# Patient Record
Sex: Female | Born: 1980 | Race: White | Hispanic: No | Marital: Single | Smoking: Never smoker
Health system: Southern US, Community
[De-identification: ages and names within clinical notes are randomized; demographics above are authoritative.]

## PROBLEM LIST (undated history)

## (undated) DIAGNOSIS — R002 Palpitations: Secondary | ICD-10-CM

## (undated) DIAGNOSIS — R7303 Prediabetes: Secondary | ICD-10-CM

## (undated) DIAGNOSIS — D649 Anemia, unspecified: Secondary | ICD-10-CM

## (undated) DIAGNOSIS — M791 Myalgia, unspecified site: Secondary | ICD-10-CM

## (undated) DIAGNOSIS — R7689 Other specified abnormal immunological findings in serum: Secondary | ICD-10-CM

## (undated) DIAGNOSIS — R0602 Shortness of breath: Secondary | ICD-10-CM

## (undated) DIAGNOSIS — M2550 Pain in unspecified joint: Secondary | ICD-10-CM

## (undated) DIAGNOSIS — D509 Iron deficiency anemia, unspecified: Secondary | ICD-10-CM

## (undated) DIAGNOSIS — E119 Type 2 diabetes mellitus without complications: Secondary | ICD-10-CM

## (undated) DIAGNOSIS — U071 COVID-19: Secondary | ICD-10-CM

## (undated) DIAGNOSIS — R768 Other specified abnormal immunological findings in serum: Secondary | ICD-10-CM

## (undated) HISTORY — DX: Type 2 diabetes mellitus without complications: E11.9

## (undated) HISTORY — DX: Pain in unspecified joint: M25.50

## (undated) HISTORY — DX: Palpitations: R00.2

## (undated) HISTORY — DX: Myalgia, unspecified site: M79.10

## (undated) HISTORY — DX: Other specified abnormal immunological findings in serum: R76.89

## (undated) HISTORY — DX: Anemia, unspecified: D64.9

## (undated) HISTORY — DX: Iron deficiency anemia, unspecified: D50.9

## (undated) HISTORY — PX: NO PAST SURGERIES: SHX2092

## (undated) HISTORY — DX: COVID-19: U07.1

## (undated) HISTORY — PX: OTHER SURGICAL HISTORY: SHX169

## (undated) HISTORY — DX: Other specified abnormal immunological findings in serum: R76.8

## (undated) HISTORY — DX: Shortness of breath: R06.02

---

## 2000-04-27 ENCOUNTER — Emergency Department (HOSPITAL_COMMUNITY): Admission: EM | Admit: 2000-04-27 | Discharge: 2000-04-27 | Payer: Self-pay | Admitting: Emergency Medicine

## 2000-04-27 ENCOUNTER — Encounter: Payer: Self-pay | Admitting: Emergency Medicine

## 2001-04-22 ENCOUNTER — Emergency Department (HOSPITAL_COMMUNITY): Admission: EM | Admit: 2001-04-22 | Discharge: 2001-04-22 | Payer: Self-pay | Admitting: Emergency Medicine

## 2007-03-20 ENCOUNTER — Emergency Department (HOSPITAL_COMMUNITY): Admission: EM | Admit: 2007-03-20 | Discharge: 2007-03-20 | Payer: Self-pay | Admitting: Emergency Medicine

## 2007-07-21 ENCOUNTER — Emergency Department (HOSPITAL_COMMUNITY): Admission: EM | Admit: 2007-07-21 | Discharge: 2007-07-21 | Payer: Self-pay | Admitting: Emergency Medicine

## 2007-07-23 ENCOUNTER — Ambulatory Visit (HOSPITAL_BASED_OUTPATIENT_CLINIC_OR_DEPARTMENT_OTHER): Admission: RE | Admit: 2007-07-23 | Discharge: 2007-07-23 | Payer: Self-pay | Admitting: Orthopedic Surgery

## 2011-04-29 NOTE — Op Note (Signed)
Kelli Cooper, Kelli Cooper           ACCOUNT NO.:  [id number]   MEDICAL RECORD NO.:  [id number]          PATIENT TYPE:  AMB   LOCATION:  DSC                          FACILITY:  [facility]   PHYSICIAN:  Feliberto Gottron. Turner Daniels, M.D.   DATE OF BIRTH:  [birth year]   DATE OF PROCEDURE:  07/23/2007  DATE OF DISCHARGE:                               OPERATIVE REPORT   PREOPERATIVE DIAGNOSIS:  Angulated and displaced left midshaft ulna  fracture.   POSTOPERATIVE DIAGNOSIS:  Angulated and displaced left midshaft ulna  fracture.   PROCEDURE:  Open reduction and internal fixation of left ulna fracture  using a six-hole DePuy 3.5 dynamic compression plate.   SURGEON:  Feliberto Gottron. Turner Daniels, M.D.   FIRST ASSISTANT:  Erskine Squibb B. Jannet Mantis.   ANESTHETIC:  General LMA.   ESTIMATED BLOOD LOSS:  Minimal.   FLUID REPLACEMENT:  800 mL crystalloid.   TOURNIQUET TIME:  19 minutes.   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  In any event, a couple of days ago she  sustained an angulated, displaced midshaft left ulna fracture, was seen  in the clinic and because of the loss of the bow of the two-bone complex  of the arm, it was recommended that she have open reduction and internal  fixation of the midshaft, pretty much transverse fracture of the ulna.  Risks and benefits of surgery were discussed, questions answered.   DESCRIPTION OF PROCEDURE:  The patient was identified by armband, taken  to the operating room at [facility] day surgery center.  Appropriate  anesthetic monitors were attached and general LMA anesthesia induced  with the patient in the supine position.  A tourniquet was applied high  to the left arm and the left upper extremity prepped and draped in the  usual sterile fashion from the fingertips to the tourniquet.  The limb  was wrapped with an Esmarch bandage, tourniquet inflated to 300  mmHg,  and we began the procedure by making a longitudinal incision over the  midshaft of the ulna subcutaneous tissue centered over the fracture site  itself, about 8 cm in length.  Small bleeders were identified and  cauterized.  The periosteum over the ulna bone was then elevated with a  periosteal elevator anteriorly and posteriorly and the fracture reduced  using lion's jaw clamps.  With the fracture reduced, a six-hole 3.5 DCP  plate from the DePuy small fragment set was then clamped onto the ulna  and the plate fixed in an anatomic position using three proximal and  three distal bicortical 3.5 screws.  One of the screws was used in the  compression mode and good compression of the fracture site was obtained.  C-arm imaging was used to confirm good position of the bone and the  plate, and hard copy films were made.  The tourniquet was let down.  Small bleeders were identified and cauterized.  The wound was irrigated  out with normal saline solution.  Subcutaneous tissue was closed in two  layers of 3-0 Vicryl suture, the skin with 4-0 Monocryl suture.  Quarter-  inch Steri-Strips were then used, followed by 4x4s, Webril, an Ace wrap  and a wrist-forearm Universal Velfoam splint.  The patient did receive 1  g of  Ancef preoperatively and prior to placement of the dressing, the  subcutaneous tissue was infiltrated with 10 mL of 0.5% Marcaine and  epinephrine solution.  At this point the patient was then awakened and  taken to the recovery room without difficulty.      Feliberto Gottron. Turner Daniels, M.D.  Electronically Signed     FJR/MEDQ  D:  07/23/2007  T:  07/23/2007  Job:  [id number]

## 2011-09-29 LAB — POCT HEMOGLOBIN-HEMACUE
Hemoglobin: 13.5
Operator id: 112821

## 2018-02-04 ENCOUNTER — Emergency Department (HOSPITAL_COMMUNITY): Payer: Self-pay

## 2018-02-04 ENCOUNTER — Encounter (HOSPITAL_COMMUNITY): Payer: Self-pay | Admitting: Emergency Medicine

## 2018-02-04 ENCOUNTER — Emergency Department (HOSPITAL_COMMUNITY)
Admission: EM | Admit: 2018-02-04 | Discharge: 2018-02-04 | Disposition: A | Discharge status: Home / Self Care | Payer: Self-pay | Attending: Emergency Medicine | Admitting: Emergency Medicine

## 2018-02-04 ENCOUNTER — Other Ambulatory Visit: Payer: Self-pay

## 2018-02-04 DIAGNOSIS — D219 Benign neoplasm of connective and other soft tissue, unspecified: Secondary | ICD-10-CM | POA: Insufficient documentation

## 2018-02-04 DIAGNOSIS — N760 Acute vaginitis: Secondary | ICD-10-CM | POA: Insufficient documentation

## 2018-02-04 DIAGNOSIS — R109 Unspecified abdominal pain: Secondary | ICD-10-CM

## 2018-02-04 DIAGNOSIS — B9689 Other specified bacterial agents as the cause of diseases classified elsewhere: Secondary | ICD-10-CM | POA: Insufficient documentation

## 2018-02-04 DIAGNOSIS — N83209 Unspecified ovarian cyst, unspecified side: Secondary | ICD-10-CM | POA: Insufficient documentation

## 2018-02-04 DIAGNOSIS — R112 Nausea with vomiting, unspecified: Secondary | ICD-10-CM | POA: Insufficient documentation

## 2018-02-04 LAB — CBC
HEMATOCRIT: 43.8 % (ref 36.0–46.0)
Hemoglobin: 14.3 g/dL (ref 12.0–15.0)
MCH: 28.4 pg (ref 26.0–34.0)
MCHC: 32.6 g/dL (ref 30.0–36.0)
MCV: 86.9 fL (ref 78.0–100.0)
PLATELETS: 257 10*3/uL (ref 150–400)
RBC: 5.04 MIL/uL (ref 3.87–5.11)
RDW: 13.1 % (ref 11.5–15.5)
WBC: 17.9 10*3/uL — ABNORMAL HIGH (ref 4.0–10.5)

## 2018-02-04 LAB — COMPREHENSIVE METABOLIC PANEL
ALBUMIN: 4 g/dL (ref 3.5–5.0)
ALT: 18 U/L (ref 14–54)
AST: 28 U/L (ref 15–41)
Alkaline Phosphatase: 69 U/L (ref 38–126)
Anion gap: 10 (ref 5–15)
BUN: 22 mg/dL — ABNORMAL HIGH (ref 6–20)
CHLORIDE: 109 mmol/L (ref 101–111)
CO2: 21 mmol/L — AB (ref 22–32)
CREATININE: 0.79 mg/dL (ref 0.44–1.00)
Calcium: 8.9 mg/dL (ref 8.9–10.3)
GFR calc Af Amer: >60 mL/min (ref >60)
GFR calc non Af Amer: >60 mL/min (ref >60)
GLUCOSE: 146 mg/dL — AB (ref 65–99)
Potassium: 4.4 mmol/L (ref 3.5–5.1)
Sodium: 140 mmol/L (ref 135–145)
Total Bilirubin: 0.8 mg/dL (ref 0.3–1.2)
Total Protein: 7 g/dL (ref 6.5–8.1)

## 2018-02-04 LAB — WET PREP, GENITAL
SPERM: NONE SEEN
Trich, Wet Prep: NONE SEEN
Yeast Wet Prep HPF POC: NONE SEEN

## 2018-02-04 LAB — I-STAT BETA HCG BLOOD, ED (MC, WL, AP ONLY): I-stat hCG, quantitative: 14.4 m[IU]/mL — ABNORMAL HIGH (ref <5)

## 2018-02-04 LAB — LIPASE, BLOOD: LIPASE: 33 U/L (ref 11–51)

## 2018-02-04 LAB — HCG, QUANTITATIVE, PREGNANCY: hCG, Beta Chain, Quant, S: <1 m[IU]/mL (ref <5)

## 2018-02-04 MED ORDER — ONDANSETRON HCL 4 MG PO TABS: 4.0000 mg | ORAL_TABLET | Freq: Three times a day (TID) | ORAL | 0 refills | Status: DC | PRN

## 2018-02-04 MED ORDER — IOPAMIDOL (ISOVUE-300) INJECTION 61%
INTRAVENOUS | Status: AC
  Filled 2018-02-04: qty 100

## 2018-02-04 MED ORDER — IOPAMIDOL (ISOVUE-300) INJECTION 61%
100.0000 mL | Freq: Once | INTRAVENOUS | Status: AC | PRN
  Administered 2018-02-04: 100 mL via INTRAVENOUS

## 2018-02-04 MED ORDER — CYCLOBENZAPRINE HCL 5 MG PO TABS: 5.0000 mg | ORAL_TABLET | Freq: Every evening | ORAL | 0 refills | Status: DC | PRN

## 2018-02-04 MED ORDER — IBUPROFEN 800 MG PO TABS
800.0000 mg | ORAL_TABLET | Freq: Once | ORAL | Status: AC
  Administered 2018-02-04: 800 mg via ORAL
  Filled 2018-02-04: qty 1

## 2018-02-04 MED ORDER — MORPHINE SULFATE (PF) 4 MG/ML IV SOLN
4.0000 mg | Freq: Once | INTRAVENOUS | Status: AC
  Administered 2018-02-04: 4 mg via INTRAVENOUS
  Filled 2018-02-04: qty 1

## 2018-02-04 MED ORDER — CYCLOBENZAPRINE HCL 10 MG PO TABS
5.0000 mg | ORAL_TABLET | Freq: Once | ORAL | Status: AC
  Administered 2018-02-04: 5 mg via ORAL
  Filled 2018-02-04: qty 1

## 2018-02-04 MED ORDER — METRONIDAZOLE 500 MG PO TABS: 500.0000 mg | ORAL_TABLET | Freq: Two times a day (BID) | ORAL | 0 refills | Status: AC

## 2018-02-04 MED ORDER — ONDANSETRON HCL 4 MG PO TABS
4.0000 mg | ORAL_TABLET | Freq: Once | ORAL | Status: AC
  Administered 2018-02-04: 4 mg via ORAL
  Filled 2018-02-04: qty 1

## 2018-02-04 MED ORDER — TRAMADOL HCL 50 MG PO TABS: 50.0000 mg | ORAL_TABLET | Freq: Two times a day (BID) | ORAL | 0 refills | Status: DC | PRN

## 2018-02-04 NOTE — ED Notes (Signed)
Patient transported to CT 

## 2018-02-04 NOTE — ED Notes (Signed)
Patient reports that she is unable to urinate at this time.  Patient given call bell and instructed to call when she can void.

## 2018-02-04 NOTE — Discharge Instructions (Signed)
Take Flagyl as prescribed.  Do not drink alcohol while taking this medicine. Take ibuprofen 3 times a day with meals.  Do not take other anti-inflammatories at the same time open (Advil, Motrin, naproxen, Aleve). You may supplement with Tylenol if you need further pain control. You may use tramadol as needed for severe pain. You may use Flexeril as needed for muscle stiffness and soreness.  Have caution while taking these medications, as they may make you tired or groggy.  Do not drive or operate heavy machinery while taking this medicine.  It is important that you follow-up with the [facility] for further evaluation and management. Return to the emergency room if you develop high fevers, persistent vomiting, or any new or concerning symptoms.

## 2018-02-04 NOTE — ED Triage Notes (Signed)
Pt complaint of mid abdominal pain with n/v onset this morning.

## 2018-02-04 NOTE — ED Provider Notes (Signed)
[facility] DEPT Provider Note   CSN: [id number] Arrival date & time: 02/04/18  1006     History   Chief Complaint Chief Complaint  Patient presents with  . Abdominal Pain  . Emesis    HPI Kelli Cooper is a 37 y.o. female who presents to ED for evaluation of acute onset, 6-hour history of generalized, lower and upper abdominal pain with 1 episode of NBNB emesis that began this morning.  She states that the pain woke her up from her sleep.  She cannot recall any inciting event or meal that may have triggered the pain and the pain began before breakfast.  No previous history of similar symptoms in the past.  Denies any dysuria, hematuria, back pain, history of kidney stones, vaginal discharge, abnormal vaginal bleeding, diarrhea, constipation.  She denies any previous abdominal surgeries.  HPI  History reviewed. No pertinent past medical history.  There are no active problems to display for this patient.   History reviewed. No pertinent surgical history.  OB History    No data available       Home Medications    Prior to Admission medications   Not on File    Family History No family history on file.  Social History Social History   Tobacco Use  . Smoking status: Not on file  Substance Use Topics  . Alcohol use: Not on file  . Drug use: Not on file     Allergies   Patient has no known allergies.   Review of Systems Review of Systems  Constitutional: Negative for appetite change, chills and fever.  HENT: Negative for ear pain, rhinorrhea, sneezing and sore throat.   Eyes: Negative for photophobia and visual disturbance.  Respiratory: Negative for cough, chest tightness, shortness of breath and wheezing.   Cardiovascular: Negative for chest pain and palpitations.  Gastrointestinal: Positive for abdominal pain, nausea and vomiting. Negative for blood in stool, constipation and diarrhea.  Genitourinary: Negative for  dysuria, flank pain, hematuria, urgency, vaginal bleeding and vaginal discharge.  Musculoskeletal: Negative for myalgias.  Skin: Negative for rash.  Neurological: Negative for dizziness, weakness and light-headedness.     Physical Exam Updated Vital Signs BP 120/79 (BP Location: Left Arm)   Pulse 86   Temp 97.8 F (36.6 C) (Oral)   Resp (!) 24   LMP 01/21/2018   SpO2 99%   Physical Exam  Constitutional: She appears well-developed and well-nourished. No distress.  Appears uncomfortable.  HENT:  Head: Normocephalic and atraumatic.  Nose: Nose normal.  Eyes: Conjunctivae and EOM are normal. Left eye exhibits no discharge. No scleral icterus.  Neck: Normal range of motion. Neck supple.  Cardiovascular: Normal rate, regular rhythm, normal heart sounds and intact distal pulses. Exam reveals no gallop and no friction rub.  No murmur heard. Pulmonary/Chest: Effort normal and breath sounds normal. No respiratory distress.  Abdominal: Soft. Bowel sounds are normal. She exhibits no distension. There is tenderness in the right upper quadrant, right lower quadrant, left upper quadrant and left lower quadrant. There is guarding. There is no rigidity and no rebound.    Musculoskeletal: Normal range of motion. She exhibits no edema.  Neurological: She is alert. She exhibits normal muscle tone. Coordination normal.  Skin: Skin is warm and dry. No rash noted.  Psychiatric: She has a normal mood and affect.  Nursing note and vitals reviewed.    ED Treatments / Results  Labs (all labs ordered are listed, but only abnormal  results are displayed) Labs Reviewed  COMPREHENSIVE METABOLIC PANEL - Abnormal; Notable for the following components:      Result Value   CO2 21 (*)    Glucose, Bld 146 (*)    BUN 22 (*)    All other components within normal limits  CBC - Abnormal; Notable for the following components:   WBC 17.9 (*)    All other components within normal limits  I-STAT BETA HCG  BLOOD, ED ([facility], [facility], [facility] ONLY) - Abnormal; Notable for the following components:   I-stat hCG, quantitative 14.4 (*)    All other components within normal limits  LIPASE, BLOOD  URINALYSIS, ROUTINE W REFLEX MICROSCOPIC  HCG, QUANTITATIVE, PREGNANCY    EKG  EKG Interpretation None       Radiology No results found.  Procedures Procedures (including critical care time)  Medications Ordered in ED Medications  morphine 4 MG/ML injection 4 mg (4 mg Intravenous Given 02/04/18 1417)     Initial Impression / Assessment and Plan / ED Course  I have reviewed the triage vital signs and the nursing notes.  Pertinent labs & imaging results that were available during my care of the patient were reviewed by me and considered in my medical decision making (see chart for details).     Patient presents to ED for evaluation of acute onset upper and lower abdominal pain that began this morning, woke her up from sleep, associated with NBNB emesis x1. No prior abdominal surgeries. Denies bowel changes, vaginal complaints, urinary complaints. She does have TTP of RUQ, RLQ, LUQ, LLQ with guarding but no rebound. Afebrile without the use of antipyretics. CBC significant for leukocytosis at 17.9 which I found suspicious since her symptoms began today. Her hcg was elevated at 14.  Serum hCG was negative.  Will order CT abdomen and pelvis for to evaluate for potential infectious or inflammatory process being the cause of her symptoms.  Care handed off to oncoming provider, [place], PA-C pending imaging, UA. If CT normal, dispo home with antiemetics and antiinflammatories.  Portions of this note were generated with Lobbyist. Dictation errors may occur despite best attempts at proofreading.   Final Clinical Impressions(s) / ED Diagnoses   Final diagnoses:  None    ED Discharge Orders    None       Delia Heady, PA-C 02/04/18 1808    Carmin Muskrat, MD 02/07/18 2034

## 2018-02-04 NOTE — ED Provider Notes (Signed)
Pt signed out to me by Mal Misty, PA-C.  Please see previous notes for further history.  In brief, patient presenting with acute abdominal pain beginning today.  Labs show elevated white count at almost 18.  Patient pain has improved with morphine.  CT scan pending.  CT shows bilateral adnexal lesions, 7 cm on left, 5.5 on right.  DDX includes hemorrhagic ovarian cyst, endometriomas, TOA, or neoplasm.  Additionally, patient was 7 cm pedunculated fibroid.  Discussed with Dr. Sherry Ruffing, will perform pelvic exam and test for gonorrhea, chlamydia, and wet prep.  Pelvic exam shows no CMT or tenderness during the exam.  Will obtain pelvic ultrasound for further evaluation. Pt reports increased pain with movement, will give 2nd dose of morphine. Pt states her abd feels like a sore muscle, would like to try to manage pain with msk relaxer.   Pelvic ultrasound shows hemorrhagic cyst on the right, regular cyst on the left and fibroids.  No concern for TOA.  Wet prep positive for BV.  Patient tolerating p.o. without difficulty.  Pt's pain improved. Discussed findings with patient.  Discussed importance of follow-up with OB/GYN.  Will give Flagyl for BV.  Will give symptomatic medications including zofran, tramadol, and flexeril. At this time, pt appears safe for d/c. Return precautions given. Pt states she understands and agrees to plan.     Franchot Heidelberg, PA-C 02/04/18 2320    Tegeler, Gwenyth Allegra, MD 02/05/18 [phone]

## 2018-02-05 LAB — GC/CHLAMYDIA PROBE AMP (~~LOC~~) NOT AT ARMC
Chlamydia: NEGATIVE
Neisseria Gonorrhea: NEGATIVE

## 2019-01-30 IMAGING — CT CT ABD-PELV W/ CM
2 of 7 series · 14 of 46 positions shown, 18 images · IV contrast (ISOVUE)
Comparison: None.

CLINICAL DATA: Acute onset abdominal pain beginning this morning.
Clinical suspicion for appendicitis.

EXAM:
CT ABDOMEN AND PELVIS WITH CONTRAST
TECHNIQUE: Multidetector CT imaging of the abdomen and pelvis was performed
using the standard protocol following bolus administration of
intravenous contrast.
CONTRAST:  100mL I6FCNH-A00 IOPAMIDOL (I6FCNH-A00) INJECTION 61%

[Series 2: axial st · axial · 0.84mm/px · z∈[-370,+6]mm · 11 of 90 slices shown, 15 images]
[im 10/90  soft-tissue]
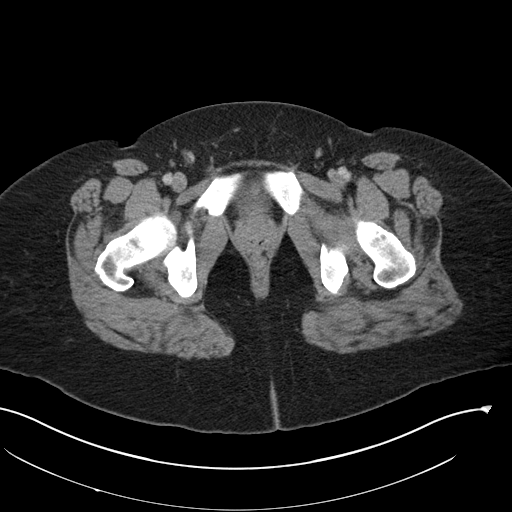
[im 10/90  bone]
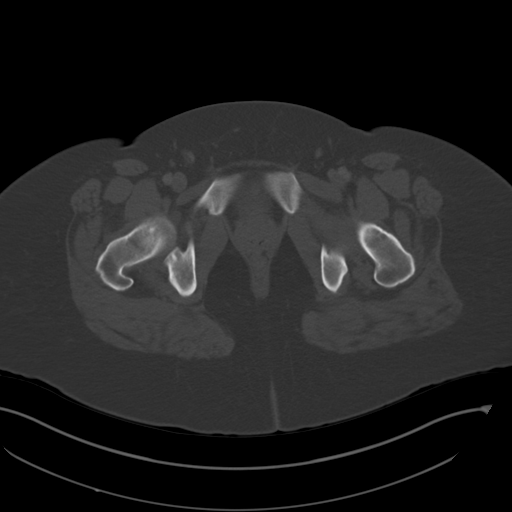
[im 19/90  soft-tissue]
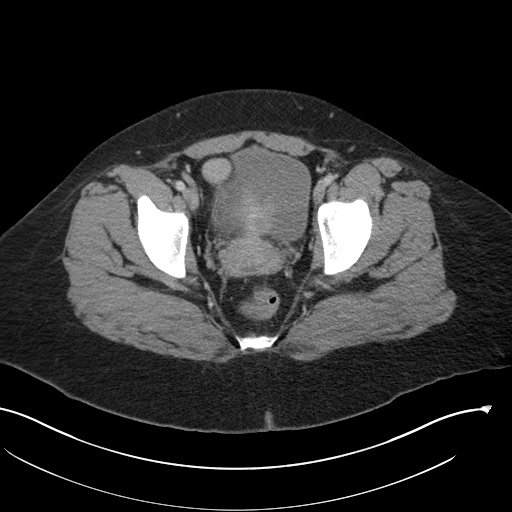
[im 29/90  soft-tissue]
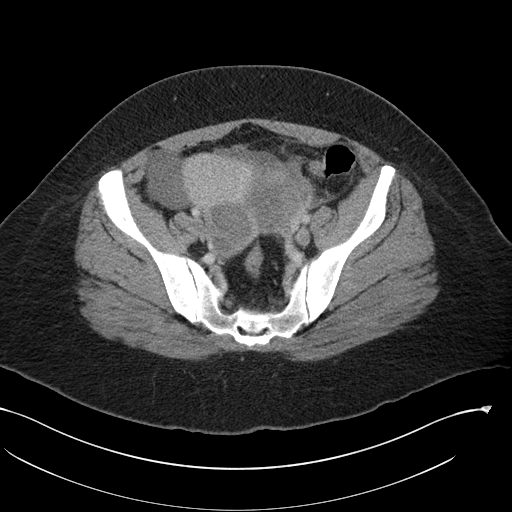
[im 38/90  soft-tissue]
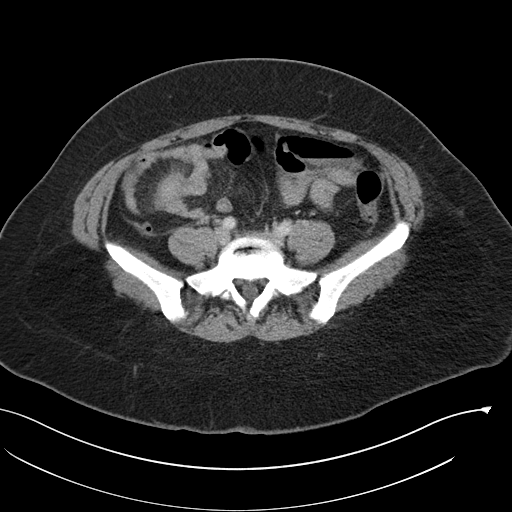
[im 47/90  soft-tissue]
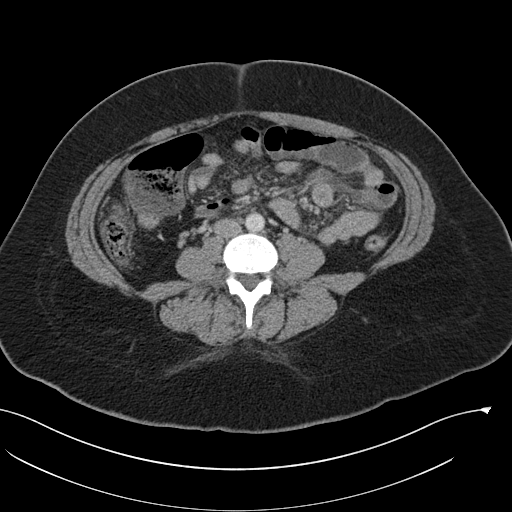
[im 57/90  soft-tissue]
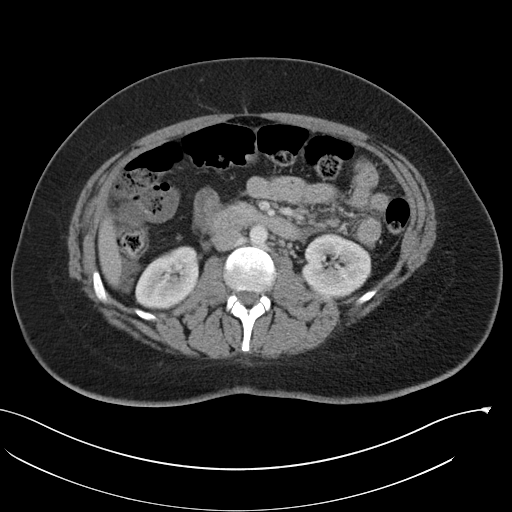
[im 66/90  soft-tissue]
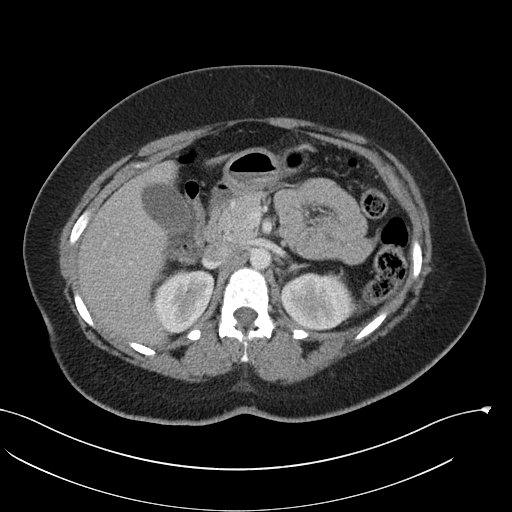
[im 71/90  lung]
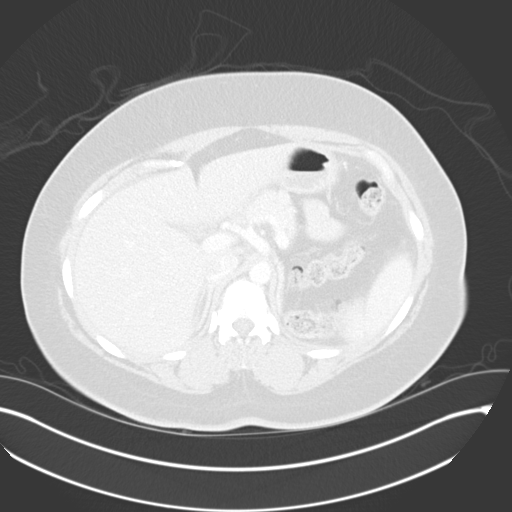
[im 75/90  soft-tissue]
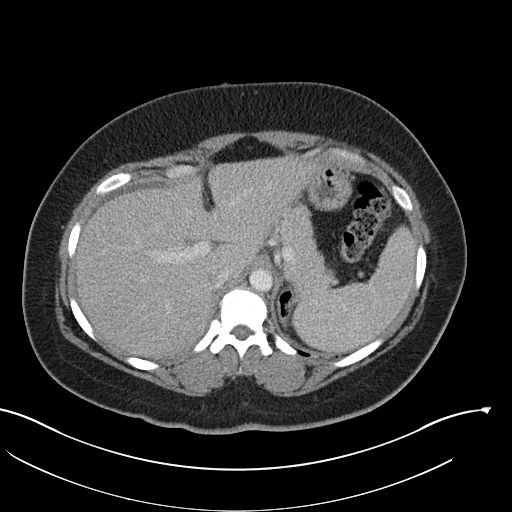
[im 75/90  lung]
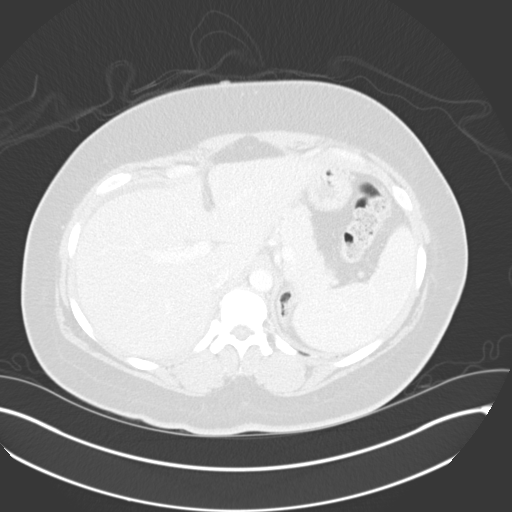
[im 80/90  lung]
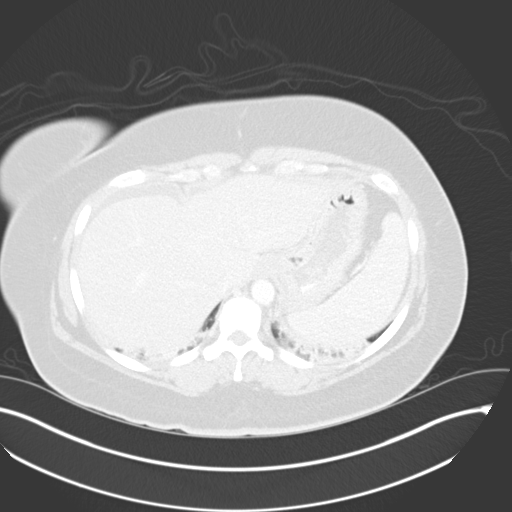
[im 85/90  soft-tissue]
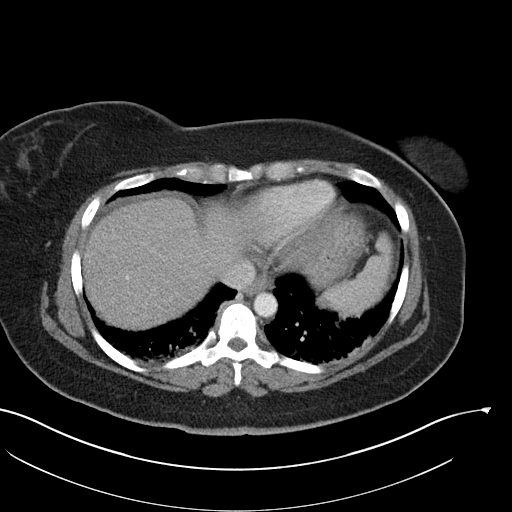
[im 85/90  lung]
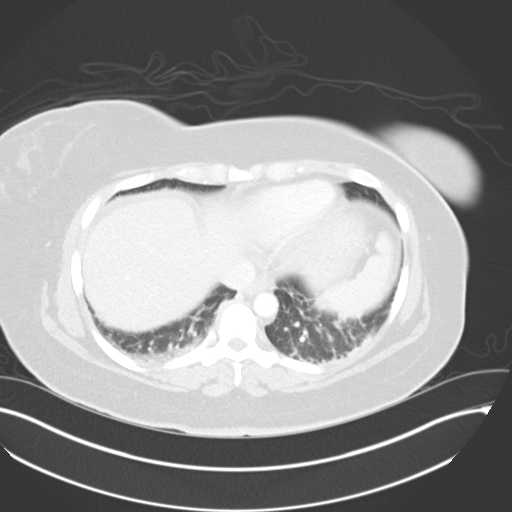
[im 85/90  bone]
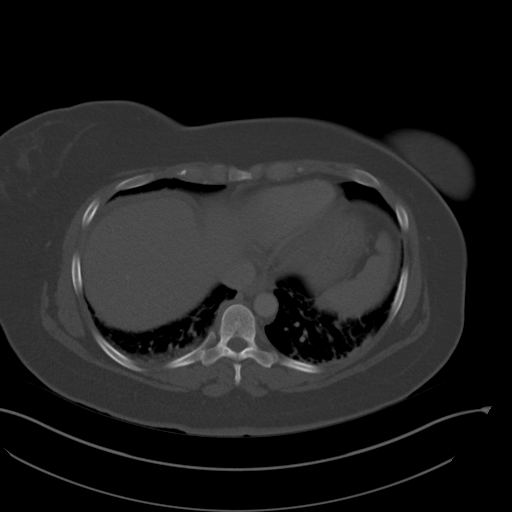

[Series 8: coronal st · coronal · 0.75mm/px · 3 of 98 slices shown]
[im 25/98  soft-tissue]
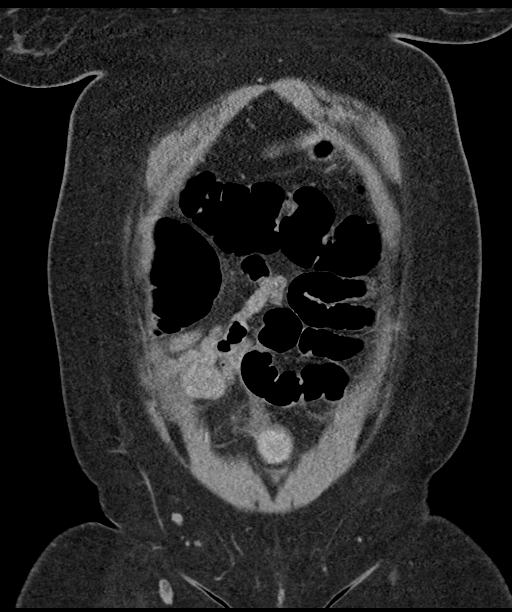
[im 49/98  soft-tissue]
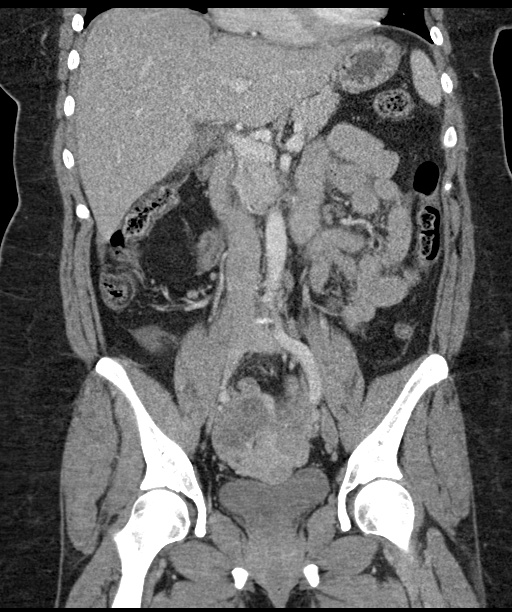
[im 73/98  soft-tissue]
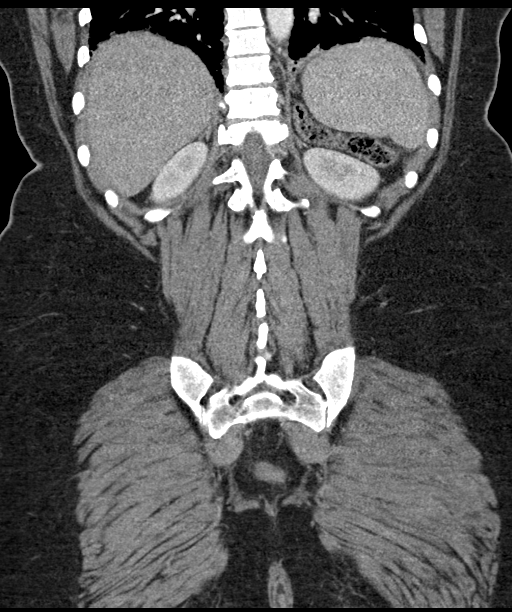

[14 of 46 positions shown; findings below may reference images not displayed]

FINDINGS: Lower Chest: Mild bibasilar atelectasis.

Hepatobiliary: No hepatic masses identified. Gallbladder is
unremarkable.

Pancreas:  No mass or inflammatory changes.

Spleen: Within normal limits in size and appearance.

Adrenals/Urinary Tract: No masses identified. No evidence of
hydronephrosis. Unremarkable unopacified urinary bladder.

Stomach/Bowel: No evidence of obstruction, inflammatory process or
abnormal fluid collections. Normal appendix visualized.

Vascular/Lymphatic: No pathologically enlarged lymph nodes. No
abdominal aortic aneurysm.

Reproductive: Enlarged uterus is seen with several fibroids, largest
which is pedunculated and arising from the right uterine fundus
measuring 7.2 cm.

Mild complex cystic lesion is seen in the left adnexa measuring 7
cm, and a similar complex cyst is seen in the right adnexa measuring
5.5 cm. Differential diagnosis includes hemorrhagic ovarian cysts,
endometriomas, tubo-ovarian abscesses, or cystic ovarian neoplasms.

Other: Small amount of free fluid is seen in the right abdomen and
pelvis.

Musculoskeletal:  No suspicious bone lesions identified.
IMPRESSION: No evidence of appendicitis.

Mildly complex bilateral cystic adnexal lesions measuring 7 cm on
the left and 5.5 cm on the right. Differential diagnosis includes
hemorrhagic ovarian cysts, endometriomas, tubo-ovarian abscesses,
and cystic ovarian neoplasms. Recommend clinical correlation;
consider further evaluation with follow-up ultrasound in 6 weeks, or
pelvic MRI without and with contrast.

Small amount of free fluid in right abdomen and pelvis.

Multiple uterine fibroids, largest arising from the right uterine
fundus measuring 7.2 cm.

## 2020-03-29 ENCOUNTER — Ambulatory Visit: Payer: Self-pay | Attending: Internal Medicine

## 2020-03-29 DIAGNOSIS — Z23 Encounter for immunization: Secondary | ICD-10-CM

## 2020-03-29 NOTE — Progress Notes (Signed)
   Covid-19 Vaccination Clinic  Name:  Kelli Cooper    MRN: [id number] DOB: 06/26/1981  03/29/2020  Kelli Cooper was observed post Covid-19 immunization for 15 minutes without incident. She was provided with Vaccine Information Sheet and instruction to access the V-Safe system.   Kelli Cooper was instructed to call 911 with any severe reactions post vaccine: Marland Kitchen Difficulty breathing  . Swelling of face and throat  . A fast heartbeat  . A bad rash all over body  . Dizziness and weakness   Immunizations Administered    Name Date Dose VIS Date Route   Pfizer COVID-19 Vaccine 03/29/2020  2:12 PM 0.3 mL 11/25/2019 Intramuscular   Manufacturer: [place]   Lot: [id number]   [place]: [id number]

## 2020-04-23 ENCOUNTER — Ambulatory Visit: Payer: Self-pay | Attending: Internal Medicine

## 2020-04-23 DIAGNOSIS — Z23 Encounter for immunization: Secondary | ICD-10-CM

## 2020-04-23 NOTE — Progress Notes (Signed)
   Covid-19 Vaccination Clinic  Name:  AMBERA CONK    MRN: [id number] DOB: Oct 31, 1981  04/23/2020  Kelli Cooper was observed post Covid-19 immunization for 15 minutes without incident. She was provided with Vaccine Information Sheet and instruction to access the V-Safe system.   Kelli Cooper was instructed to call 911 with any severe reactions post vaccine: Marland Kitchen Difficulty breathing  . Swelling of face and throat  . A fast heartbeat  . A bad rash all over body  . Dizziness and weakness   Immunizations Administered    Name Date Dose VIS Date Route   Pfizer COVID-19 Vaccine 04/23/2020  2:14 PM 0.3 mL 02/08/2019 Intramuscular   Manufacturer: [place]   Lot: [id number]   [place]: [id number]

## 2023-07-29 ENCOUNTER — Ambulatory Visit: Payer: Medicaid Other | Admitting: Nurse Practitioner

## 2023-07-29 ENCOUNTER — Encounter: Payer: Self-pay | Admitting: Nurse Practitioner

## 2023-07-29 VITALS — BP 132/89 | HR 64 | Temp 97.3°F | Ht 63.0 in | Wt 231.2 lb

## 2023-07-29 DIAGNOSIS — Z131 Encounter for screening for diabetes mellitus: Secondary | ICD-10-CM

## 2023-07-29 DIAGNOSIS — M2550 Pain in unspecified joint: Secondary | ICD-10-CM

## 2023-07-29 DIAGNOSIS — R0602 Shortness of breath: Secondary | ICD-10-CM | POA: Diagnosis not present

## 2023-07-29 DIAGNOSIS — R739 Hyperglycemia, unspecified: Secondary | ICD-10-CM | POA: Diagnosis not present

## 2023-07-29 DIAGNOSIS — M791 Myalgia, unspecified site: Secondary | ICD-10-CM

## 2023-07-29 DIAGNOSIS — R5381 Other malaise: Secondary | ICD-10-CM | POA: Diagnosis not present

## 2023-07-29 DIAGNOSIS — R002 Palpitations: Secondary | ICD-10-CM

## 2023-07-29 DIAGNOSIS — Z8616 Personal history of COVID-19: Secondary | ICD-10-CM

## 2023-07-29 DIAGNOSIS — Z13220 Encounter for screening for lipoid disorders: Secondary | ICD-10-CM

## 2023-07-29 NOTE — Progress Notes (Unsigned)
@Patient  ID: Kelli Cooper, female    DOB: 25-Dec-1980, 42 y.o.   MRN: [id number]  Chief Complaint  Patient presents with   Establish Care    Referring provider: No ref. provider found   HPI  Patient presents today for post-COVID care visit.  Patient states that she had COVID in January 2022 and in June 2022.  She states that since the first time she had COVID she has been having ongoing muscle and joint pain, intermittent fevers (low-grade), and heart palpitations.  She has also had generalized fatigue and weakness.  We discussed that we can refer her to physical therapy for deconditioning.  We will check labs today.  We will refer her to cardiology for heart palpitations.  EKG in this today was normal. Denies f/c/s, n/v/d, hemoptysis, PND, leg swelling Denies chest pain or edema       No Known Allergies  Immunization History  Administered Date(s) Administered   PFIZER(Purple Top)SARS-COV-2 Vaccination 03/29/2020, 04/23/2020    History reviewed. No pertinent past medical history.  Tobacco History: Social History   Tobacco Use  Smoking Status Never  Smokeless Tobacco Never   Counseling given: Not Answered   Outpatient Encounter Medications as of 07/29/2023  Medication Sig   b complex vitamins capsule Take 1 capsule by mouth daily.   ibuprofen (ADVIL) 200 MG tablet Take 200 mg by mouth every 6 (six) hours as needed.   naproxen sodium (ALEVE) 220 MG tablet Take 220 mg by mouth.   cyclobenzaprine (FLEXERIL) 5 MG tablet Take 1 tablet (5 mg total) by mouth at bedtime as needed for muscle spasms. (Patient not taking: Reported on 07/29/2023)   ondansetron (ZOFRAN) 4 MG tablet Take 1 tablet (4 mg total) by mouth every 8 (eight) hours as needed for nausea or vomiting. (Patient not taking: Reported on 07/29/2023)   traMADol (ULTRAM) 50 MG tablet Take 1 tablet (50 mg total) by mouth every 12 (twelve) hours as needed. (Patient not taking: Reported on 07/29/2023)   No  facility-administered encounter medications on file as of 07/29/2023.     Review of Systems  Review of Systems  Constitutional: Negative.   HENT: Negative.    Cardiovascular: Negative.   Gastrointestinal: Negative.   Allergic/Immunologic: Negative.   Neurological: Negative.   Psychiatric/Behavioral: Negative.         Physical Exam  BP 132/89   Pulse 64   Temp (!) 97.3 F (36.3 C)   Ht 5\' 3"  (1.6 m)   Wt 231 lb 3.2 oz (104.9 kg)   SpO2 100%   BMI 40.96 kg/m   Wt Readings from Last 5 Encounters:  07/29/23 231 lb 3.2 oz (104.9 kg)     Physical Exam Vitals and nursing note reviewed.  Constitutional:      General: She is not in acute distress.    Appearance: She is well-developed.  Cardiovascular:     Rate and Rhythm: Normal rate and regular rhythm.  Pulmonary:     Effort: Pulmonary effort is normal.     Breath sounds: Normal breath sounds.  Neurological:     Mental Status: She is alert and oriented to person, place, and time.      Lab Results:  CBC    Component Value Date/Time   WBC 7.6 07/29/2023 1548   WBC 17.9 (H) 02/04/2018 1131   RBC 4.43 07/29/2023 1548   RBC 5.04 02/04/2018 1131   HGB 9.5 (L) 07/29/2023 1548   HCT 31.6 (L) 07/29/2023 1548   PLT  367 07/29/2023 1548   MCV 71 (L) 07/29/2023 1548   MCH 21.4 (L) 07/29/2023 1548   MCH 28.4 02/04/2018 1131   MCHC 30.1 (L) 07/29/2023 1548   MCHC 32.6 02/04/2018 1131   RDW 16.2 (H) 07/29/2023 1548    BMET    Component Value Date/Time   NA 139 07/29/2023 1548   K 4.6 07/29/2023 1548   CL 106 07/29/2023 1548   CO2 21 07/29/2023 1548   GLUCOSE 113 (H) 07/29/2023 1548   GLUCOSE 146 (H) 02/04/2018 1131   BUN 9 07/29/2023 1548   CREATININE 0.84 07/29/2023 1548   CALCIUM 9.3 07/29/2023 1548   GFRNONAA >60 02/04/2018 1131   GFRAA >60 02/04/2018 1131      Assessment & Plan:   Muscle pain - Rheumatoid factor - ANA - Sedimentation Rate   2. Arthralgia, unspecified joint  - CBC -  Comprehensive metabolic panel - Rheumatoid factor - Sedimentation Rate   3. Heart palpitations  - CBC - Comprehensive metabolic panel - TSH - Brain natriuretic peptide   4. Shortness of breath  - CBC - Comprehensive metabolic panel - TSH - Brain natriuretic peptide   5. Lipid screening  - Lipid Panel   6. Diabetes mellitus screening  - Hemoglobin A1c    Follow up:  Follow up in 3 months     Ivonne Andrew, NP 07/30/2023

## 2023-07-29 NOTE — Patient Instructions (Addendum)
1. Muscle pain  - Rheumatoid factor - ANA - Sedimentation Rate   2. Arthralgia, unspecified joint  - CBC - Comprehensive metabolic panel - Rheumatoid factor - Sedimentation Rate   3. Heart palpitations  - CBC - Comprehensive metabolic panel - TSH - Brain natriuretic peptide   4. Shortness of breath  - CBC - Comprehensive metabolic panel - TSH - Brain natriuretic peptide   5. Lipid screening  - Lipid Panel   6. Diabetes mellitus screening  - Hemoglobin A1c    Follow up:  Follow up in 3 months

## 2023-07-30 ENCOUNTER — Encounter: Payer: Self-pay | Admitting: Nurse Practitioner

## 2023-07-30 DIAGNOSIS — M791 Myalgia, unspecified site: Secondary | ICD-10-CM | POA: Insufficient documentation

## 2023-07-30 LAB — BRAIN NATRIURETIC PEPTIDE: BNP: 3.1 pg/mL (ref 0.0–100.0)

## 2023-07-30 LAB — RHEUMATOID FACTOR: Rheumatoid fact SerPl-aCnc: <10 [IU]/mL (ref <14.0)

## 2023-07-30 LAB — COMPREHENSIVE METABOLIC PANEL
ALT: 8 IU/L (ref 0–32)
AST: 16 IU/L (ref 0–40)
Albumin: 4.3 g/dL (ref 3.9–4.9)
Alkaline Phosphatase: 76 IU/L (ref 44–121)
BUN/Creatinine Ratio: 11 (ref 9–23)
BUN: 9 mg/dL (ref 6–24)
Bilirubin Total: <0.2 mg/dL (ref 0.0–1.2)
CO2: 21 mmol/L (ref 20–29)
Calcium: 9.3 mg/dL (ref 8.7–10.2)
Chloride: 106 mmol/L (ref 96–106)
Creatinine, Ser: 0.84 mg/dL (ref 0.57–1.00)
Globulin, Total: 2.6 g/dL (ref 1.5–4.5)
Glucose: 113 mg/dL — ABNORMAL HIGH (ref 70–99)
Potassium: 4.6 mmol/L (ref 3.5–5.2)
Sodium: 139 mmol/L (ref 134–144)
Total Protein: 6.9 g/dL (ref 6.0–8.5)
eGFR: 88 mL/min/{1.73_m2} (ref >59)

## 2023-07-30 LAB — HEMOGLOBIN A1C
Est. average glucose Bld gHb Est-mCnc: 120 mg/dL
Hgb A1c MFr Bld: 5.8 % — ABNORMAL HIGH (ref 4.8–5.6)

## 2023-07-30 LAB — CBC
Hematocrit: 31.6 % — ABNORMAL LOW (ref 34.0–46.6)
Hemoglobin: 9.5 g/dL — ABNORMAL LOW (ref 11.1–15.9)
MCH: 21.4 pg — ABNORMAL LOW (ref 26.6–33.0)
MCHC: 30.1 g/dL — ABNORMAL LOW (ref 31.5–35.7)
MCV: 71 fL — ABNORMAL LOW (ref 79–97)
Platelets: 367 10*3/uL (ref 150–450)
RBC: 4.43 x10E6/uL (ref 3.77–5.28)
RDW: 16.2 % — ABNORMAL HIGH (ref 11.7–15.4)
WBC: 7.6 10*3/uL (ref 3.4–10.8)

## 2023-07-30 LAB — LIPID PANEL
Chol/HDL Ratio: 7.1 ratio — ABNORMAL HIGH (ref 0.0–4.4)
Cholesterol, Total: 219 mg/dL — ABNORMAL HIGH (ref 100–199)
HDL: 31 mg/dL — ABNORMAL LOW (ref >39)
LDL Chol Calc (NIH): 160 mg/dL — ABNORMAL HIGH (ref 0–99)
Triglycerides: 153 mg/dL — ABNORMAL HIGH (ref 0–149)
VLDL Cholesterol Cal: 28 mg/dL (ref 5–40)

## 2023-07-30 LAB — SEDIMENTATION RATE: Sed Rate: 24 mm/h (ref 0–32)

## 2023-07-30 LAB — TSH: TSH: 2.71 u[IU]/mL (ref 0.450–4.500)

## 2023-07-30 LAB — ANA: Anti Nuclear Antibody (ANA): POSITIVE — AB

## 2023-07-30 NOTE — Assessment & Plan Note (Signed)
-   Rheumatoid factor - ANA - Sedimentation Rate   2. Arthralgia, unspecified joint  - CBC - Comprehensive metabolic panel - Rheumatoid factor - Sedimentation Rate   3. Heart palpitations  - CBC - Comprehensive metabolic panel - TSH - Brain natriuretic peptide   4. Shortness of breath  - CBC - Comprehensive metabolic panel - TSH - Brain natriuretic peptide   5. Lipid screening  - Lipid Panel   6. Diabetes mellitus screening  - Hemoglobin A1c    Follow up:  Follow up in 3 months

## 2023-08-06 ENCOUNTER — Ambulatory Visit: Payer: Medicaid Other | Admitting: Nurse Practitioner

## 2023-08-06 ENCOUNTER — Other Ambulatory Visit (HOSPITAL_COMMUNITY): Payer: Self-pay

## 2023-08-06 ENCOUNTER — Encounter: Payer: Self-pay | Admitting: Nurse Practitioner

## 2023-08-06 VITALS — BP 108/60 | HR 71 | Temp 97.5°F | Wt 238.0 lb

## 2023-08-06 DIAGNOSIS — M2550 Pain in unspecified joint: Secondary | ICD-10-CM

## 2023-08-06 DIAGNOSIS — E782 Mixed hyperlipidemia: Secondary | ICD-10-CM

## 2023-08-06 DIAGNOSIS — M791 Myalgia, unspecified site: Secondary | ICD-10-CM | POA: Diagnosis not present

## 2023-08-06 DIAGNOSIS — D649 Anemia, unspecified: Secondary | ICD-10-CM

## 2023-08-06 DIAGNOSIS — R5383 Other fatigue: Secondary | ICD-10-CM | POA: Diagnosis not present

## 2023-08-06 DIAGNOSIS — R768 Other specified abnormal immunological findings in serum: Secondary | ICD-10-CM | POA: Insufficient documentation

## 2023-08-06 DIAGNOSIS — E538 Deficiency of other specified B group vitamins: Secondary | ICD-10-CM | POA: Diagnosis not present

## 2023-08-06 DIAGNOSIS — R7303 Prediabetes: Secondary | ICD-10-CM

## 2023-08-06 MED ORDER — IRON (FERROUS SULFATE) 325 (65 FE) MG PO TABS
325.0000 mg | ORAL_TABLET | Freq: Every day | ORAL | 1 refills | Status: DC
Start: 2023-08-06 — End: 2023-11-06
  Filled 2023-08-06: qty 30, fill #0

## 2023-08-06 MED ORDER — ROSUVASTATIN CALCIUM 10 MG PO TABS
10.0000 mg | ORAL_TABLET | Freq: Every day | ORAL | 3 refills | Status: DC
Start: 2023-08-06 — End: 2023-11-06
  Filled 2023-08-06: qty 90, 90d supply, fill #0

## 2023-08-06 NOTE — Progress Notes (Signed)
@Patient  ID: Kelli Cooper, female    DOB: 1981/09/03, 42 y.o.   MRN: [id number]  Chief Complaint  Patient presents with   Follow-up    Labs     Referring provider: No ref. provider found   HPI  Patient presents today for follow-up and lab results.  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  Labs did reveal positive ANA.  We discussed that we will refer patient to rheumatology for further evaluation and treatment.  Patient was having associated arthralgias and myalgias and fatigue.  Patient was found to be anemic we will check iron panel today.  We will start patient on iron supplements.  We will check vitamin B12 level today.  Patient was also found to have elevated cholesterol.  Will start patient on Crestor.  Triglycerides and A1c were elevated.  Discussed with patient that she is in prediabetic range and advised that she does need to go on a strict diabetic diet.  She is awaiting a call from physical therapy to get scheduled to start physical therapy for physical deconditioning. Denies f/c/s, n/v/d, hemoptysis, PND, leg swelling Denies chest pain or edema      No Known Allergies  Immunization History  Administered Date(s) Administered   PFIZER(Purple Top)SARS-COV-2 Vaccination 03/29/2020, 04/23/2020    No past medical history on file.  Tobacco History: Social History   Tobacco Use  Smoking Status Never  Smokeless Tobacco Never   Counseling given: Not Answered   Outpatient Encounter Medications as of 08/06/2023  Medication Sig   b complex vitamins capsule Take 1 capsule by mouth daily.   ibuprofen (ADVIL) 200 MG tablet Take 200 mg by mouth every 6 (six) hours as needed.   Iron, Ferrous Sulfate, 325 (65 Fe) MG TABS Take 325 mg by mouth daily.   naproxen sodium (ALEVE) 220 MG tablet Take 220 mg by mouth.   rosuvastatin (CRESTOR) 10 MG tablet Take 1 tablet (10 mg total) by mouth daily.   cyclobenzaprine  (FLEXERIL) 5 MG tablet Take 1 tablet (5 mg total) by mouth at bedtime as needed for muscle spasms. (Patient not taking: Reported on 07/29/2023)   ondansetron (ZOFRAN) 4 MG tablet Take 1 tablet (4 mg total) by mouth every 8 (eight) hours as needed for nausea or vomiting. (Patient not taking: Reported on 07/29/2023)   traMADol (ULTRAM) 50 MG tablet Take 1 tablet (50 mg total) by mouth every 12 (twelve) hours as needed. (Patient not taking: Reported on 07/29/2023)   No facility-administered encounter medications on file as of 08/06/2023.     Review of Systems  Review of Systems  Constitutional:  Positive for fatigue.  HENT: Negative.    Cardiovascular: Negative.   Gastrointestinal: Negative.   Allergic/Immunologic: Negative.   Neurological: Negative.   Psychiatric/Behavioral: Negative.         Physical Exam  BP 108/60   Pulse 71   Temp (!) 97.5 F (36.4 C)   Wt 238 lb (108 kg)   SpO2 100%   BMI 42.16 kg/m   Wt Readings from Last 5 Encounters:  08/06/23 238 lb (108 kg)  07/29/23 231 lb 3.2 oz (104.9 kg)     Physical Exam Vitals and nursing note reviewed.  Constitutional:      General: She is not in acute distress.    Appearance: She is well-developed.  Cardiovascular:     Rate and Rhythm: Normal rate and regular rhythm.  Pulmonary:     Effort:  Pulmonary effort is normal.     Breath sounds: Normal breath sounds.  Neurological:     Mental Status: She is alert and oriented to person, place, and time.      Lab Results:  CBC    Component Value Date/Time   WBC 7.6 07/29/2023 1548   WBC 17.9 (H) 02/04/2018 1131   RBC 4.43 07/29/2023 1548   RBC 5.04 02/04/2018 1131   HGB 9.5 (L) 07/29/2023 1548   HCT 31.6 (L) 07/29/2023 1548   PLT 367 07/29/2023 1548   MCV 71 (L) 07/29/2023 1548   MCH 21.4 (L) 07/29/2023 1548   MCH 28.4 02/04/2018 1131   MCHC 30.1 (L) 07/29/2023 1548   MCHC 32.6 02/04/2018 1131   RDW 16.2 (H) 07/29/2023 1548    BMET    Component Value  Date/Time   NA 139 07/29/2023 1548   K 4.6 07/29/2023 1548   CL 106 07/29/2023 1548   CO2 21 07/29/2023 1548   GLUCOSE 113 (H) 07/29/2023 1548   GLUCOSE 146 (H) 02/04/2018 1131   BUN 9 07/29/2023 1548   CREATININE 0.84 07/29/2023 1548   CALCIUM 9.3 07/29/2023 1548   GFRNONAA >60 02/04/2018 1131   GFRAA >60 02/04/2018 1131    BNP    Component Value Date/Time   BNP 3.1 07/29/2023 1548    ProBNP No results found for: "PROBNP"  Imaging: No results found.   Assessment & Plan:   Positive ANA (antinuclear antibody) - Ambulatory referral to Rheumatology  2. Muscle pain  - Ambulatory referral to Rheumatology  3. Arthralgia, unspecified joint  - Ambulatory referral to Rheumatology  4. Other fatigue  - Ambulatory referral to Rheumatology  5. Anemia, unspecified type  - Iron, TIBC and Ferritin Panel - Iron, Ferrous Sulfate, 325 (65 Fe) MG TABS; Take 325 mg by mouth daily.  Dispense: 30 tablet; Refill: 1  6. Vitamin B12 deficiency  - Vitamin B12  7. Mixed hyperlipidemia  - rosuvastatin (CRESTOR) 10 MG tablet; Take 1 tablet (10 mg total) by mouth daily.  Dispense: 90 tablet; Refill: 3  8. Prediabetes  -Handout was given in office today on diabetic diet.  -Exercises encouraged.  Follow up:  Follow up in 3 months     Ivonne Andrew, NP 08/06/2023

## 2023-08-06 NOTE — Patient Instructions (Addendum)
1. Positive ANA (antinuclear antibody)  - Ambulatory referral to Rheumatology  2. Muscle pain  - Ambulatory referral to Rheumatology  3. Arthralgia, unspecified joint  - Ambulatory referral to Rheumatology  4. Other fatigue  - Ambulatory referral to Rheumatology  5. Anemia, unspecified type  - Iron, TIBC and Ferritin Panel - Iron, Ferrous Sulfate, 325 (65 Fe) MG TABS; Take 325 mg by mouth daily.  Dispense: 30 tablet; Refill: 1  6. Vitamin B12 deficiency  - Vitamin B12  7. Mixed hyperlipidemia  - rosuvastatin (CRESTOR) 10 MG tablet; Take 1 tablet (10 mg total) by mouth daily.  Dispense: 90 tablet; Refill: 3  8. Prediabetes  -Handout was given in office today on diabetic diet.  -Exercises encouraged.  Follow up:  Follow up in 3 months

## 2023-08-06 NOTE — Assessment & Plan Note (Signed)
-   Ambulatory referral to Rheumatology  2. Muscle pain  - Ambulatory referral to Rheumatology  3. Arthralgia, unspecified joint  - Ambulatory referral to Rheumatology  4. Other fatigue  - Ambulatory referral to Rheumatology  5. Anemia, unspecified type  - Iron, TIBC and Ferritin Panel - Iron, Ferrous Sulfate, 325 (65 Fe) MG TABS; Take 325 mg by mouth daily.  Dispense: 30 tablet; Refill: 1  6. Vitamin B12 deficiency  - Vitamin B12  7. Mixed hyperlipidemia  - rosuvastatin (CRESTOR) 10 MG tablet; Take 1 tablet (10 mg total) by mouth daily.  Dispense: 90 tablet; Refill: 3  8. Prediabetes  -Handout was given in office today on diabetic diet.  -Exercises encouraged.  Follow up:  Follow up in 3 months

## 2023-08-07 ENCOUNTER — Ambulatory Visit: Payer: Medicaid Other | Attending: Nurse Practitioner | Admitting: Physical Therapy

## 2023-08-07 ENCOUNTER — Other Ambulatory Visit: Payer: Self-pay | Admitting: Nurse Practitioner

## 2023-08-07 VITALS — BP 123/81 | HR 81

## 2023-08-07 DIAGNOSIS — R5381 Other malaise: Secondary | ICD-10-CM | POA: Diagnosis present

## 2023-08-07 DIAGNOSIS — M6281 Muscle weakness (generalized): Secondary | ICD-10-CM | POA: Insufficient documentation

## 2023-08-07 DIAGNOSIS — D649 Anemia, unspecified: Secondary | ICD-10-CM

## 2023-08-07 DIAGNOSIS — R2689 Other abnormalities of gait and mobility: Secondary | ICD-10-CM | POA: Insufficient documentation

## 2023-08-07 DIAGNOSIS — E611 Iron deficiency: Secondary | ICD-10-CM

## 2023-08-07 LAB — IRON,TIBC AND FERRITIN PANEL
Ferritin: 5 ng/mL — ABNORMAL LOW (ref 15–150)
Iron Saturation: 6 % — CL (ref 15–55)
Iron: 21 ug/dL — ABNORMAL LOW (ref 27–159)
Total Iron Binding Capacity: 359 ug/dL (ref 250–450)
UIBC: 338 ug/dL (ref 131–425)

## 2023-08-07 LAB — VITAMIN B12: Vitamin B-12: 372 pg/mL (ref 232–1245)

## 2023-08-07 NOTE — Therapy (Signed)
OUTPATIENT PHYSICAL THERAPY NEURO EVALUATION   Patient Name: Kelli Cooper MRN: [id number] DOB:Sep 22, 1981, 42 y.o., female Today's Date: 08/07/2023   PCP: Ivonne Andrew, NP REFERRING PROVIDER: Ivonne Andrew, NP  END OF SESSION:  PT End of Session - 08/07/23 1532     Visit Number 1    Number of Visits 7   with eval   Date for PT Re-Evaluation 10/02/23    Authorization Type Medicaid [employer]    PT Start Time 1531    PT Stop Time 1615    PT Time Calculation (min) 44 min    Activity Tolerance Patient limited by fatigue    Behavior During Therapy [facility] for tasks assessed/performed             No past medical history on file. No past surgical history on file. Patient Active Problem List   Diagnosis Date Noted   Positive ANA (antinuclear antibody) 08/06/2023   Muscle pain 07/30/2023    ONSET DATE: 07/29/2023  REFERRING DIAG: R53.81 (ICD-10-CM) - Physical deconditioning  THERAPY DIAG:  No diagnosis found.  Rationale for Evaluation and Treatment: Rehabilitation  SUBJECTIVE:                                                                                                                                                                                             SUBJECTIVE STATEMENT: Pt goes by "Kelli Cooper"  Pt reports she got covid in Jan of 2022 as well as June of 2022. 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 Pt has been out of work since covid. Also since having covid she has been experiencing extreme exhaustion to the point where sometimes she can't even get out of bed for a week at a time except to use the bathroom. When she knows her cycle is coming she will even meal prep because she knows she won't be able to do as much during that week.  Pt was also able to walk her dog everyday and since getting sick she has had to shorten her route significantly and her dog would even have to  essentially drag her home at times, has since stopped walking her dog. She also enjoys playing guitar but can feel her arms getting heavy when she tries to play. She reports she has a lot of muscle soreness in her shoulders as well as her back. She also sings professionally and teaches voice lessens, does feel like her breath support is good and she is still able to sing.  Pt currently lives in a 2 story  house and it takes a while to slow her heart rate after walking up the stairs. Even doing things that should be easy like lifting a tree limb off of a bush are extremely difficult and make her feel "puny". Grocery shopping is an all day event. If she is feeling well enough she can walk to the corner store but she has to go slow and sometimes takes a walking stick, otherwise she might have groceries delivered.  Pt has started taking an iron supplement and takes B12 as well. Lingering symptoms from covid include: fatigue, insomnia, sore muscles, shortness of breath, heart palpitations, food sensitivity, sore eyes, mild fevers regularly; inability to exercise or stand for a long period of time, brain fog (ex: can't remember if she took a shower).  Pt feels that there must be some nerve damage in her feet as well because they feel hot when she tries to sleep. She has used a Scientist, clinical (histocompatibility and immunogenetics) and feels like this along with rubbing lotion on her feet and the B12 have helped to improve her symptoms.    Pt accompanied by: self  PERTINENT HISTORY: Positive ANA, arthralgia, anemia, B12 deficiency, mixed hyperlipidemia, prediabetes  PAIN:  Are you having pain?  Whole body muscle soreness but mostly in shoulders and back  PRECAUTIONS: None  RED FLAGS: None   WEIGHT BEARING RESTRICTIONS: No  FALLS: Has patient fallen in last 6 months? No  LIVING ENVIRONMENT: Lives with: lives alone Lives in: House/apartment Stairs: Yes: Internal: 12 steps; on left going up and External: 2 steps; none Has following  equipment at home:  "stick that I found outside that I use as a cane"  PLOF: Independent with gait and Independent with transfers  PATIENT GOALS: immediate goal: be able to walk to/from bus stop to increase independence with community mobility (wants to be able to come to therapy without needing a ride); LTG: return to jogging (dancing in the woods with music)  OBJECTIVE:   DIAGNOSTIC FINDINGS: None relevant to this POC  COGNITION: Overall cognitive status: Within functional limits for tasks assessed   SENSATION: WFL Light touch and proprioception WFL  COORDINATION: WFL  LOWER EXTREMITY ROM:     Active  Right Eval Left Eval  Hip flexion [facility]  Hip extension    Hip abduction    Hip adduction    Hip internal rotation    Hip external rotation    Knee flexion [facility] WFL  Knee extension [facility]  Ankle dorsiflexion [facility]  Ankle plantarflexion    Ankle inversion    Ankle eversion     (Blank rows = not tested)  LOWER EXTREMITY MMT:    MMT Right Eval Left Eval  Hip flexion 5 5  Hip extension    Hip abduction    Hip adduction    Hip internal rotation    Hip external rotation    Knee flexion 5 5  Knee extension 5 5  Ankle dorsiflexion 5 5  Ankle plantarflexion    Ankle inversion    Ankle eversion    (Blank rows = not tested)  BED MOBILITY:  Mod I per pt report  TRANSFERS: Assistive device utilized: None  Sit to stand: Modified independence (increased time) Stand to sit: Modified independence Chair to chair: Modified independence Floor:  not assessed at eval   GAIT: Gait pattern: decreased hip/knee flexion- Right and decreased hip/knee flexion- Left Distance walked: various clinic distances Assistive device utilized: None Level of assistance: Modified independence Comments: needs  increased time, slow gait speed with onset of fatigue  FUNCTIONAL TESTS:    [facility] PT Assessment - 08/07/23 1554       Ambulation/Gait   Gait velocity 32.8 ft over 9.07  sec = 3.62 ft/sec      Standardized Balance Assessment   Standardized Balance Assessment Timed Up and Go Test;Five Times Sit to Stand    Five times sit to stand comments  15.06 sec   no UE support     Timed Up and Go Test   TUG Normal TUG    Normal TUG (seconds) 5.1   no AD           : 326 ft no AD, RPE 8/10 30 sec sit to stand: 8 reps, RPE 6/10   VITALS Vitals:   08/07/23 1551  BP: 123/81  Pulse: 81  SpO2: 99%   Vitals assessed at rest with patient in seated position.  TODAY'S TREATMENT:                                                                                                                              PT Evaluation  Pt also noted to have TTP in cervical and upper shoulder muscles, could benefit from TPDN in future sessions if agreeable.    PATIENT EDUCATION: Education details: Eval findings, PT POC, results of OM and functional implications Person educated: Patient Education method: Explanation Education comprehension: verbalized understanding and needs further education  HOME EXERCISE PROGRAM: To be initiated  GOALS: Goals reviewed with patient? Yes  SHORT TERM GOALS: Target date: 08/29/2023  Pt will be independent with initial HEP for improved strength, balance, and endurance. Baseline: Goal status: INITIAL  2.  Pt will ambulate greater than or equal to 400 feet on with no AD and mod I for improved cardiovascular endurance and BLE strength.  Baseline: 326 ft (8/23) Goal status: INITIAL  3.  Pt will improve 30 sec STS to greater than or equal to 10 stands to demonstrate improved functional strength, transfer efficiency, and endurance.  Baseline: 8 stands (8/23) Goal status: INITIAL   LONG TERM GOALS: Target date: 09/19/2023   Pt will be independent with final HEP and walking program for improved strength, balance, and endurance. Baseline:  Goal status: INITIAL  2.  Pt will ambulate greater than or equal to 475 feet on with no  AD and mod I for improved cardiovascular endurance and BLE strength.  Baseline: 326 ft (8/23) Goal status: INITIAL  3.  Pt will improve 30 sec STS to greater than or equal to 12 stands to demonstrate improved functional strength, transfer efficiency, and endurance.  Baseline: 8 stands (8/22) Goal status: INITIAL  4.  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   Pt's PMH is significant for: Positive ANA, arthralgia, anemia, B12 deficiency, mixed hyperlipidemia, prediabetes. The following deficits were present during the exam: impaired endurance. Pt would benefit from skilled PT to address these impairments and functional limitations to maximize functional mobility independence.   OBJECTIVE IMPAIRMENTS: cardiopulmonary status limiting activity, decreased activity tolerance, decreased endurance, decreased knowledge of condition, decreased mobility, and impaired perceived functional ability.   ACTIVITY LIMITATIONS: carrying, lifting, bending, standing, and stairs  PARTICIPATION LIMITATIONS: meal prep, cleaning, laundry, interpersonal relationship, shopping, community activity, occupation, and yard work  PERSONAL FACTORS: Time since onset of injury/illness/exacerbation, Transportation, and 1-2 comorbidities:    Positive ANA, arthralgia, anemia, B12 deficiency, mixed hyperlipidemia, prediabetesare also affecting patient's functional outcome.   REHAB POTENTIAL: Good  CLINICAL DECISION MAKING: Stable/uncomplicated  EVALUATION COMPLEXITY: Low  PLAN:  PT FREQUENCY: 1x/week  PT DURATION: 6 weeks  PLANNED INTERVENTIONS: Therapeutic exercises, Therapeutic activity, Neuromuscular re-education,  Balance training, Gait training, Patient/Family education, Self Care, Joint mobilization, Stair training, DME instructions, Dry Needling, Cryotherapy, Moist heat, Taping, Manual therapy, and Re-evaluation  PLAN FOR NEXT SESSION: initiate HEP and walking program to work on global endurance training (SciFit vs NuStep vs elliptical vs treadmill with vitals monitoring)   Check all possible CPT codes: 60454 - PT Re-evaluation, 97110- Therapeutic Exercise, [phone]- Neuro Re-education, [phone] - Gait Training, [phone] - Manual Therapy, 97530 - Therapeutic Activities, [phone] - Self Care, and 97750 - Physical performance training    Check all conditions that are expected to impact treatment: {Conditions expected to impact treatment:Medical complications related to COVID-19   If treatment provided at initial evaluation, no treatment charged due to lack of authorization.         Peter Congo, PT, DPT, CSRS  08/07/2023, 4:16 PM

## 2023-08-14 ENCOUNTER — Ambulatory Visit: Payer: Medicaid Other | Admitting: Physical Therapy

## 2023-08-14 DIAGNOSIS — R5381 Other malaise: Secondary | ICD-10-CM

## 2023-08-14 DIAGNOSIS — R2689 Other abnormalities of gait and mobility: Secondary | ICD-10-CM

## 2023-08-14 DIAGNOSIS — M6281 Muscle weakness (generalized): Secondary | ICD-10-CM

## 2023-08-14 NOTE — Therapy (Signed)
OUTPATIENT PHYSICAL THERAPY NEURO TREATMENT   Patient Name: Kelli Cooper MRN: [id number] DOB:November 05, 1981, 42 y.o., female Today's Date: 08/14/2023   PCP: Ivonne Andrew, NP REFERRING PROVIDER: Ivonne Andrew, NP  END OF SESSION:  PT End of Session - 08/14/23 1444     Visit Number 2    Number of Visits 7   with eval   Date for PT Re-Evaluation 10/02/23    Authorization Type Medicaid [employer]    PT Start Time 1444    PT Stop Time 1515   pt vomited after dry needling   PT Time Calculation (min) 31 min    Activity Tolerance Patient limited by fatigue;Treatment limited secondary to medical complications (Comment)   vomiting after dry needling   Behavior During Therapy [facility] for tasks assessed/performed              No past medical history on file. No past surgical history on file. Patient Active Problem List   Diagnosis Date Noted   Positive ANA (antinuclear antibody) 08/06/2023   Muscle pain 07/30/2023    ONSET DATE: 07/29/2023  REFERRING DIAG: R53.81 (ICD-10-CM) - Physical deconditioning  THERAPY DIAG:  Physical deconditioning  Muscle weakness (generalized)  Other abnormalities of gait and mobility  Rationale for Evaluation and Treatment: Rehabilitation  SUBJECTIVE:                                                                                                                                                                                             SUBJECTIVE STATEMENT: Pt goes by "Kelli Cooper"  Pt reports she has been walking around the block every night with her walking stick. Pt reports ongoing whole body soreness along with pain across her upper back and shoulders.   Pt accompanied by: self  PERTINENT HISTORY: Positive ANA, arthralgia, anemia, B12 deficiency, mixed hyperlipidemia, prediabetes  PAIN:  Are you having pain?  Whole body muscle soreness but mostly in shoulders and back  PRECAUTIONS: None  RED FLAGS: None   WEIGHT BEARING  RESTRICTIONS: No  FALLS: Has patient fallen in last 6 months? No  LIVING ENVIRONMENT: Lives with: lives alone Lives in: House/apartment Stairs: Yes: Internal: 12 steps; on left going up and External: 2 steps; none Has following equipment at home:  "stick that I found outside that I use as a cane"  PLOF: Independent with gait and Independent with transfers  PATIENT GOALS: immediate goal: be able to walk to/from bus stop to increase independence with community mobility (wants to be able to come to therapy without needing a ride); LTG: return to jogging (dancing in the woods with music)  OBJECTIVE:   DIAGNOSTIC FINDINGS: None relevant to this POC  COGNITION: Overall cognitive status: Within functional limits for tasks assessed   SENSATION: WFL Light touch and proprioception WFL  COORDINATION: WFL  LOWER EXTREMITY ROM:     Active  Right Eval Left Eval  Hip flexion [facility]  Hip extension    Hip abduction    Hip adduction    Hip internal rotation    Hip external rotation    Knee flexion [facility] WFL  Knee extension [facility]  Ankle dorsiflexion [facility] WFL  Ankle plantarflexion    Ankle inversion    Ankle eversion     (Blank rows = not tested)  LOWER EXTREMITY MMT:    MMT Right Eval Left Eval  Hip flexion 5 5  Hip extension    Hip abduction    Hip adduction    Hip internal rotation    Hip external rotation    Knee flexion 5 5  Knee extension 5 5  Ankle dorsiflexion 5 5  Ankle plantarflexion    Ankle inversion    Ankle eversion    (Blank rows = not tested)    VITALS There were no vitals filed for this visit.   TODAY'S TREATMENT:                                                                                                                               TherAct SpO2 99% at rest  Trigger Point Dry-Needling  Treatment instructions: Expect mild to moderate muscle soreness. S/S of pneumothorax if dry needled over a lung field, and to seek immediate medical  attention should they occur. Patient verbalized understanding of these instructions and education.  Patient Consent Given: Yes Education handout provided: Previously provided Muscles treated: L and R suboccipitals, R splenius Treatment response/outcome: pressure with suboccipitals, onset of nausea vomiting following treatment of R splenius muscle; pt does report feeling better after vomiting and has some relief of her pain     PATIENT EDUCATION: Education details: TPDN Person educated: Patient Education method: Explanation Education comprehension: verbalized understanding and needs further education  HOME EXERCISE PROGRAM: To be initiated  GOALS: Goals reviewed with patient? Yes  SHORT TERM GOALS: Target date: 08/29/2023  Pt will be independent with initial HEP for improved strength, balance, and endurance. Baseline: Goal status: INITIAL  2.  Pt will ambulate greater than or equal to 400 feet on with no AD and mod I for improved cardiovascular endurance and BLE strength.  Baseline: 326 ft (8/23) Goal status: INITIAL  3.  Pt will improve 30 sec STS to greater than or equal to 10 stands to demonstrate improved functional strength, transfer efficiency, and endurance.  Baseline: 8 stands (8/23) Goal status: INITIAL   LONG TERM GOALS: Target date: 09/19/2023   Pt will be independent with final HEP and walking program for improved strength, balance, and endurance. Baseline:  Goal status: INITIAL  2.  Pt will ambulate  greater than or equal to 475 feet on with no AD and mod I for improved cardiovascular endurance and BLE strength.  Baseline: 326 ft (8/23) Goal status: INITIAL  3.  Pt will improve 30 sec STS to greater than or equal to 12 stands to demonstrate improved functional strength, transfer efficiency, and endurance.  Baseline: 8 stands (8/22) Goal status: INITIAL  4.  Pt will be able to ambulate >/= 1000 ft outdoors across unlevel surfaces with RPE </= 5/10  in order to demonstrate return to community mobility Baseline: 326 ft RPE 8/10 (8/23) Goal status: INITIAL    ASSESSMENT:  CLINICAL IMPRESSION: Emphasis of skilled PT session on initiating TPDN to treat tight and painful muscles in neck region. Pt initially with good response to TPDN but then has onset of nausea and vomiting. Pt reports feeling better after vomiting and with some relief of pain in cervical region. Pt agreeable to attempt DN again in a future session. Deferred any exercise this session due to response to DN. Encouraged pt to continue taking her walks around the neighborhood with gradual increase in distance and monitoring of response/recovery from exercise.Continue POC.    OBJECTIVE IMPAIRMENTS: cardiopulmonary status limiting activity, decreased activity tolerance, decreased endurance, decreased knowledge of condition, decreased mobility, and impaired perceived functional ability.   ACTIVITY LIMITATIONS: carrying, lifting, bending, standing, and stairs  PARTICIPATION LIMITATIONS: meal prep, cleaning, laundry, interpersonal relationship, shopping, community activity, occupation, and yard work  PERSONAL FACTORS: Time since onset of injury/illness/exacerbation, Transportation, and 1-2 comorbidities:    Positive ANA, arthralgia, anemia, B12 deficiency, mixed hyperlipidemia, prediabetesare also affecting patient's functional outcome.   REHAB POTENTIAL: Good  CLINICAL DECISION MAKING: Stable/uncomplicated  EVALUATION COMPLEXITY: Low  PLAN:  PT FREQUENCY: 1x/week  PT DURATION: 6 weeks  PLANNED INTERVENTIONS: Therapeutic exercises, Therapeutic activity, Neuromuscular re-education, Balance training, Gait training, Patient/Family education, Self Care, Joint mobilization, Stair training, DME instructions, Dry Needling, Cryotherapy, Moist heat, Taping, Manual therapy, and Re-evaluation  PLAN FOR NEXT SESSION: initiate HEP and walking program to work on global endurance  training (SciFit vs NuStep vs elliptical vs treadmill with vitals monitoring), how did she feel after DN?; carrying weights, squats, sit to stands on various surfaces, seated on therapy ball   Check all possible CPT codes: 16109 - PT Re-evaluation, 97110- Therapeutic Exercise, [phone]- Neuro Re-education, [phone] - Gait Training, [phone] - Manual Therapy, 97530 - Therapeutic Activities, 97535 - Self Care, and 97750 - Physical performance training    Check all conditions that are expected to impact treatment: {Conditions expected to impact treatment:Medical complications related to COVID-19   If treatment provided at initial evaluation, no treatment charged due to lack of authorization.         Peter Congo, PT, DPT, CSRS  08/14/2023, 3:17 PM

## 2023-08-19 ENCOUNTER — Ambulatory Visit: Payer: Medicaid Other | Attending: Nurse Practitioner | Admitting: Physical Therapy

## 2023-08-19 DIAGNOSIS — R2689 Other abnormalities of gait and mobility: Secondary | ICD-10-CM | POA: Insufficient documentation

## 2023-08-19 DIAGNOSIS — M6281 Muscle weakness (generalized): Secondary | ICD-10-CM | POA: Diagnosis present

## 2023-08-19 DIAGNOSIS — R5381 Other malaise: Secondary | ICD-10-CM | POA: Diagnosis present

## 2023-08-19 NOTE — Therapy (Signed)
OUTPATIENT PHYSICAL THERAPY NEURO TREATMENT   Patient Name: Kelli Cooper MRN: [id number] DOB:January 08, 1981, 42 y.o., female Today's Date: 08/19/2023   PCP: Kelli Andrew, NP REFERRING PROVIDER: Ivonne Andrew, NP  END OF SESSION:  PT End of Session - 08/19/23 1532     Visit Number 3    Number of Visits 7   with eval   Date for PT Re-Evaluation 10/02/23    Authorization Type Medicaid [employer]    PT Start Time 1530    PT Stop Time 1615    PT Time Calculation (min) 45 min    Activity Tolerance Patient limited by fatigue;Patient tolerated treatment well    Behavior During Therapy [facility] for tasks assessed/performed               No past medical history on file. No past surgical history on file. Patient Active Problem List   Diagnosis Date Noted   Positive ANA (antinuclear antibody) 08/06/2023   Muscle pain 07/30/2023    ONSET DATE: 07/29/2023  REFERRING DIAG: R53.81 (ICD-10-CM) - Physical deconditioning  THERAPY DIAG:  Physical deconditioning  Muscle weakness (generalized)  Other abnormalities of gait and mobility  Rationale for Evaluation and Treatment: Rehabilitation  SUBJECTIVE:                                                                                                                                                                                             SUBJECTIVE STATEMENT: Pt goes by "Kelli Cooper"  Pt reports her neck has been feeling better since last session and she feels like her back is popping again whereas it hasn't for a while. Pt has had very little energy over the past week due to being on her cycle.  Pt has still been taking her nightly walk, has not increased the distance yet.   Pt accompanied by: self  PERTINENT HISTORY: Positive ANA, arthralgia, anemia, B12 deficiency, mixed hyperlipidemia, prediabetes  PAIN:  Are you having pain?  Whole body muscle soreness but mostly in shoulders and back  PRECAUTIONS: None  RED  FLAGS: None   WEIGHT BEARING RESTRICTIONS: No  FALLS: Has patient fallen in last 6 months? No  LIVING ENVIRONMENT: Lives with: lives alone Lives in: House/apartment Stairs: Yes: Internal: 12 steps; on left going up and External: 2 steps; none Has following equipment at home:  "stick that I found outside that I use as a cane"  PLOF: Independent with gait and Independent with transfers  PATIENT GOALS: immediate goal: be able to walk to/from bus stop to increase independence with community mobility (wants to be able to come to therapy without  needing a ride); LTG: return to jogging (dancing in the woods with music)  OBJECTIVE:   DIAGNOSTIC FINDINGS: None relevant to this POC  COGNITION: Overall cognitive status: Within functional limits for tasks assessed   SENSATION: WFL Light touch and proprioception WFL  COORDINATION: WFL  LOWER EXTREMITY ROM:     Active  Right Eval Left Eval  Hip flexion [facility]  Hip extension    Hip abduction    Hip adduction    Hip internal rotation    Hip external rotation    Knee flexion [facility] WFL  Knee extension [facility]  Ankle dorsiflexion [facility] WFL  Ankle plantarflexion    Ankle inversion    Ankle eversion     (Blank rows = not tested)  LOWER EXTREMITY MMT:    MMT Right Eval Left Eval  Hip flexion 5 5  Hip extension    Hip abduction    Hip adduction    Hip internal rotation    Hip external rotation    Knee flexion 5 5  Knee extension 5 5  Ankle dorsiflexion 5 5  Ankle plantarflexion    Ankle inversion    Ankle eversion    (Blank rows = not tested)    TODAY'S TREATMENT:                                                                                                                               TherAct Vitals assessed at rest: SpO2 99%, HR 70  TherEx SciFit multi-peaks level 3 for 8 minutes using BUE/BLEs for neural priming for reciprocal movement, dynamic cardiovascular warmup and increased amplitude of stepping. RPE of  7/10 following activity.   SpO2 99% and HR 84 bpm following SciFit.  Resisted sidesteps 4 x 10 ft L/R with red theraband, seated rest break after 2 reps Resisted monster walks 6 x 10 ft with red theraband  Supine LTRs x 10 reps B Open books for thoracic rotation x 10 reps B  Added to HEP, see bolded below     PATIENT EDUCATION: Education details: initiated HEP Person educated: Patient Education method: Explanation Education comprehension: verbalized understanding and needs further education  HOME EXERCISE PROGRAM: Access Code: [email or web address] URL: [email or web address] Date: 08/19/2023 Prepared by: Peter Congo  Exercises - Side Stepping with Resistance at Ankles  - 1 x daily - 7 x weekly - 3 sets - 10 reps - Forward Monster Walk with Resistance (BKA)  - 1 x daily - 7 x weekly - 3 sets - 10 reps - Supine Lower Trunk Rotation  - 1 x daily - 7 x weekly - 3 sets - 10 reps - Sidelying Open Book Thoracic Lumbar Rotation and Extension  - 1 x daily - 7 x weekly - 3 sets - 10 reps  GOALS: Goals reviewed with patient? Yes  SHORT TERM GOALS: Target date: 08/29/2023  Pt will be independent with initial HEP for improved strength, balance, and endurance. Baseline: Goal  status: INITIAL  2.  Pt will ambulate greater than or equal to 400 feet on with no AD and mod I for improved cardiovascular endurance and BLE strength.  Baseline: 326 ft (8/23) Goal status: INITIAL  3.  Pt will improve 30 sec STS to greater than or equal to 10 stands to demonstrate improved functional strength, transfer efficiency, and endurance.  Baseline: 8 stands (8/23) Goal status: INITIAL   LONG TERM GOALS: Target date: 09/19/2023   Pt will be independent with final HEP and walking program for improved strength, balance, and endurance. Baseline:  Goal status: INITIAL  2.  Pt will ambulate greater than or equal to 475 feet on with no AD and mod I for improved cardiovascular endurance  and BLE strength.  Baseline: 326 ft (8/23) Goal status: INITIAL  3.  Pt will improve 30 sec STS to greater than or equal to 12 stands to demonstrate improved functional strength, transfer efficiency, and endurance.  Baseline: 8 stands (8/22) Goal status: INITIAL  4.  Pt will be able to ambulate >/= 1000 ft outdoors across unlevel surfaces with RPE </= 5/10 in order to demonstrate return to community mobility Baseline: 326 ft RPE 8/10 (8/23) Goal status: INITIAL    ASSESSMENT:  CLINICAL IMPRESSION: Emphasis of skilled PT session on initiating HEP as well as working on global endurance training. Pt does require frequent seated rest breaks due to ongoing impaired endurance and onset of fatigue. Pt continues to benefit from skilled therapy services to work towards improving her global endurance and work towards returning to [employer]. Continue POC.    OBJECTIVE IMPAIRMENTS: cardiopulmonary status limiting activity, decreased activity tolerance, decreased endurance, decreased knowledge of condition, decreased mobility, and impaired perceived functional ability.   ACTIVITY LIMITATIONS: carrying, lifting, bending, standing, and stairs  PARTICIPATION LIMITATIONS: meal prep, cleaning, laundry, interpersonal relationship, shopping, community activity, occupation, and yard work  PERSONAL FACTORS: Time since onset of injury/illness/exacerbation, Transportation, and 1-2 comorbidities:    Positive ANA, arthralgia, anemia, B12 deficiency, mixed hyperlipidemia, prediabetesare also affecting patient's functional outcome.   REHAB POTENTIAL: Good  CLINICAL DECISION MAKING: Stable/uncomplicated  EVALUATION COMPLEXITY: Low  PLAN:  PT FREQUENCY: 1x/week  PT DURATION: 6 weeks  PLANNED INTERVENTIONS: Therapeutic exercises, Therapeutic activity, Neuromuscular re-education, Balance training, Gait training, Patient/Family education, Self Care, Joint mobilization, Stair training, DME instructions, Dry  Needling, Cryotherapy, Moist heat, Taping, Manual therapy, and Re-evaluation  PLAN FOR NEXT SESSION: add to HEP and walking program to work on global endurance training (SciFit vs NuStep vs elliptical vs treadmill with vitals monitoring), DN? carrying weights, squats, sit to stands on various surfaces, seated on therapy ball  Add in stretches and ROM:  Thoracic mobilization over towel roll Childs pose Anterior leans over therapy ball Yoga poses   Check all possible CPT codes: 16109 - PT Re-evaluation, 97110- Therapeutic Exercise, [phone]- Neuro Re-education, [phone] - Gait Training, [phone] - Manual Therapy, 97530 - Therapeutic Activities, 97535 - Self Care, and 97750 - Physical performance training    Check all conditions that are expected to impact treatment: {Conditions expected to impact treatment:Medical complications related to COVID-19   If treatment provided at initial evaluation, no treatment charged due to lack of authorization.         Peter Congo, PT, DPT, CSRS  08/19/2023, 4:19 PM

## 2023-08-24 ENCOUNTER — Ambulatory Visit: Payer: Medicaid Other | Admitting: Physical Therapy

## 2023-09-01 ENCOUNTER — Ambulatory Visit: Payer: Medicaid Other | Admitting: Physical Therapy

## 2023-09-01 DIAGNOSIS — R5381 Other malaise: Secondary | ICD-10-CM | POA: Diagnosis not present

## 2023-09-01 DIAGNOSIS — M6281 Muscle weakness (generalized): Secondary | ICD-10-CM

## 2023-09-01 DIAGNOSIS — R2689 Other abnormalities of gait and mobility: Secondary | ICD-10-CM

## 2023-09-01 NOTE — Therapy (Signed)
OUTPATIENT PHYSICAL THERAPY NEURO TREATMENT   Patient Name: Kelli Cooper MRN: [id number] DOB:Nov 15, 1981, 42 y.o., female Today's Date: 09/01/2023   PCP: Ivonne Andrew, NP REFERRING PROVIDER: Ivonne Andrew, NP  END OF SESSION:  PT End of Session - 09/01/23 1404     Visit Number 4    Number of Visits 7   with eval   Date for PT Re-Evaluation 10/02/23    Authorization Type Medicaid [employer]    PT Start Time 1400    PT Stop Time 1448    PT Time Calculation (min) 48 min    Activity Tolerance Patient limited by fatigue;Patient tolerated treatment well    Behavior During Therapy [facility] for tasks assessed/performed                No past medical history on file. No past surgical history on file. Patient Active Problem List   Diagnosis Date Noted   Positive ANA (antinuclear antibody) 08/06/2023   Muscle pain 07/30/2023    ONSET DATE: 07/29/2023  REFERRING DIAG: R53.81 (ICD-10-CM) - Physical deconditioning  THERAPY DIAG:  Physical deconditioning  Other abnormalities of gait and mobility  Muscle weakness (generalized)  Rationale for Evaluation and Treatment: Rehabilitation  SUBJECTIVE:                                                                                                                                                                                             SUBJECTIVE STATEMENT: Pt goes by "[name]"  Pt has not been able to exercise as much more consistently over the past few weeks BUT has noticed when she has trouble sleeping if she exercises first she sleeps better. Pt denies any falls or any pain today. Pt was also able to increase the distance of her walk through her neighborhood.   Pt accompanied by: self  PERTINENT HISTORY: Positive ANA, arthralgia, anemia, B12 deficiency, mixed hyperlipidemia, prediabetes  PAIN:  Are you having pain?  Whole body muscle soreness but mostly in shoulders and back  PRECAUTIONS: None  RED  FLAGS: None   WEIGHT BEARING RESTRICTIONS: No  FALLS: Has patient fallen in last 6 months? No  LIVING ENVIRONMENT: Lives with: lives alone Lives in: House/apartment Stairs: Yes: Internal: 12 steps; on left going up and External: 2 steps; none Has following equipment at home:  "stick that I found outside that I use as a cane"  PLOF: Independent with gait and Independent with transfers  PATIENT GOALS: immediate goal: be able to walk to/from bus stop to increase independence with community mobility (wants to be able to come to therapy without needing a ride);  LTG: return to jogging (dancing in the woods with music)  OBJECTIVE:   DIAGNOSTIC FINDINGS: None relevant to this POC  COGNITION: Overall cognitive status: Within functional limits for tasks assessed   SENSATION: WFL Light touch and proprioception WFL  COORDINATION: WFL  LOWER EXTREMITY ROM:     Active  Right Eval Left Eval  Hip flexion [facility]  Hip extension    Hip abduction    Hip adduction    Hip internal rotation    Hip external rotation    Knee flexion [facility] WFL  Knee extension [facility]  Ankle dorsiflexion [facility] WFL  Ankle plantarflexion    Ankle inversion    Ankle eversion     (Blank rows = not tested)  LOWER EXTREMITY MMT:    MMT Right Eval Left Eval  Hip flexion 5 5  Hip extension    Hip abduction    Hip adduction    Hip internal rotation    Hip external rotation    Knee flexion 5 5  Knee extension 5 5  Ankle dorsiflexion 5 5  Ankle plantarflexion    Ankle inversion    Ankle eversion    (Blank rows = not tested)    TODAY'S TREATMENT:                                                                                                                               TherAct Vitals assessed at rest: SpO2 99%, HR 70  For STG assessment: 30 sec sit to stand: 8 reps : 373 ft, RPE 2/10  Gait on treadmill at 0.6 mph x 5 min at 1% grade x 4 min; x 2 reps. RPE 6/10 to 7/10. SpO2 remains at 98%  and higher with activity.  TherEx Child's pose on mat table 3 x 30 sec each +R/L lateral SB 2 x 30 sec each  Added to HEP, see bolded below     PATIENT EDUCATION: Education details: continue HEP and neighborhood walks, added to [employer] Person educated: Patient Education method: Programmer, multimedia, Demonstration, Actor cues, Verbal cues, and Handouts Education comprehension: verbalized understanding, returned demonstration, and needs further education  HOME EXERCISE PROGRAM: Access Code: [email or web address] URL: [email or web address] Date: 08/19/2023 Prepared by: Peter Congo  Exercises - Side Stepping with Resistance at Ankles  - 1 x daily - 7 x weekly - 3 sets - 10 reps - Forward Monster Walk with Resistance (BKA)  - 1 x daily - 7 x weekly - 3 sets - 10 reps - Supine Lower Trunk Rotation  - 1 x daily - 7 x weekly - 3 sets - 10 reps - Sidelying Open Book Thoracic Lumbar Rotation and Extension  - 1 x daily - 7 x weekly - 3 sets - 10 reps - Child's Pose Stretch  - 1 x daily - 7 x weekly - 1 sets - 5 reps - 30 sec hold - Child's Pose with Sidebending  - 1 x daily -  7 x weekly - 1 sets - 5 reps - 30 sec hold  GOALS: Goals reviewed with patient? Yes  SHORT TERM GOALS: Target date: 08/29/2023  Pt will be independent with initial HEP for improved strength, balance, and endurance. Baseline: Goal status: MET  2.  Pt will ambulate greater than or equal to 400 feet on with no AD and mod I for improved cardiovascular endurance and BLE strength.  Baseline: 326 ft (8/23), 373 ft (9/17) Goal status: IN PROGRESS  3.  Pt will improve 30 sec STS to greater than or equal to 10 stands to demonstrate improved functional strength, transfer efficiency, and endurance.  Baseline: 8 stands (8/23), 8 stands (9/17) Goal status: IN PROGRESS   LONG TERM GOALS: Target date: 09/19/2023   Pt will be independent with final HEP and walking program for improved strength, balance, and  endurance. Baseline:  Goal status: INITIAL  2.  Pt will ambulate greater than or equal to 475 feet on with no AD and mod I for improved cardiovascular endurance and BLE strength.  Baseline: 326 ft (8/23) Goal status: INITIAL  3.  Pt will improve 30 sec STS to greater than or equal to 12 stands to demonstrate improved functional strength, transfer efficiency, and endurance.  Baseline: 8 stands (8/22) Goal status: INITIAL  4.  Pt will be able to ambulate >/= 1000 ft outdoors across unlevel surfaces with RPE </= 5/10 in order to demonstrate return to community mobility Baseline: 326 ft RPE 8/10 (8/23) Goal status: INITIAL    ASSESSMENT:  CLINICAL IMPRESSION: Emphasis of skilled PT session on assessing STG, continuing to work on global endurance training, and working on stretching for pain management and improved mobility. Pt has met 1/3 STG and is making progress towards 3/3 STG. Pt continues to exhibit impaired endurance as evidenced by distance covered during and repetitions of sit to stand performed during 30 sec STS test. Pt is able to tolerate ambulation 2 x 5 min on treadmill this date with addition of 1% grade/incline. Pt continues to benefit from skilled therapy services to work towards improving her endurance and increasing her independence with management of pain. Continue POC.    OBJECTIVE IMPAIRMENTS: cardiopulmonary status limiting activity, decreased activity tolerance, decreased endurance, decreased knowledge of condition, decreased mobility, and impaired perceived functional ability.   ACTIVITY LIMITATIONS: carrying, lifting, bending, standing, and stairs  PARTICIPATION LIMITATIONS: meal prep, cleaning, laundry, interpersonal relationship, shopping, community activity, occupation, and yard work  PERSONAL FACTORS: Time since onset of injury/illness/exacerbation, Transportation, and 1-2 comorbidities:    Positive ANA, arthralgia, anemia, B12 deficiency, mixed  hyperlipidemia, prediabetesare also affecting patient's functional outcome.   REHAB POTENTIAL: Good  CLINICAL DECISION MAKING: Stable/uncomplicated  EVALUATION COMPLEXITY: Low  PLAN:  PT FREQUENCY: 1x/week  PT DURATION: 6 weeks  PLANNED INTERVENTIONS: Therapeutic exercises, Therapeutic activity, Neuromuscular re-education, Balance training, Gait training, Patient/Family education, Self Care, Joint mobilization, Stair training, DME instructions, Dry Needling, Cryotherapy, Moist heat, Taping, Manual therapy, and Re-evaluation  PLAN FOR NEXT SESSION: add to HEP and walking program to work on Administrator, Civil Service training (SciFit vs NuStep vs elliptical vs treadmill with vitals monitoring), DN? carrying weights, squats, sit to stands on various surfaces, seated on therapy ball  Add in stretches and ROM:  Thoracic mobilization over towel roll Anterior leans over therapy ball Yoga poses Hip stretches  Wants to try DN again next visit   Check all possible CPT codes: 16109 - PT Re-evaluation, 97110- Therapeutic Exercise, [phone]- Neuro Re-education,  78295 - Gait Training, 62130 - Manual Therapy, [id number] - Therapeutic Activities, [phone] - Self Care, and [phone] - Physical performance training    Check all conditions that are expected to impact treatment: {Conditions expected to impact treatment:Medical complications related to COVID-19   If treatment provided at initial evaluation, no treatment charged due to lack of authorization.         Peter Congo, PT, DPT, CSRS  09/01/2023, 2:49 PM

## 2023-09-02 ENCOUNTER — Inpatient Hospital Stay: Payer: Medicaid Other | Admitting: Hematology

## 2023-09-02 ENCOUNTER — Telehealth: Payer: Self-pay | Admitting: Hematology

## 2023-09-02 ENCOUNTER — Inpatient Hospital Stay: Payer: Medicaid Other

## 2023-09-08 ENCOUNTER — Ambulatory Visit: Payer: Medicaid Other | Admitting: Physical Therapy

## 2023-09-09 ENCOUNTER — Telehealth: Payer: Self-pay | Admitting: Physical Therapy

## 2023-09-15 ENCOUNTER — Ambulatory Visit: Payer: Medicaid Other | Attending: Nurse Practitioner | Admitting: Physical Therapy

## 2023-09-15 DIAGNOSIS — R2689 Other abnormalities of gait and mobility: Secondary | ICD-10-CM | POA: Insufficient documentation

## 2023-09-15 DIAGNOSIS — M6281 Muscle weakness (generalized): Secondary | ICD-10-CM | POA: Diagnosis not present

## 2023-09-15 DIAGNOSIS — R5381 Other malaise: Secondary | ICD-10-CM | POA: Insufficient documentation

## 2023-09-15 NOTE — Therapy (Addendum)
OUTPATIENT PHYSICAL THERAPY NEURO TREATMENT   Patient Name: Kelli Cooper MRN: [id number] DOB:07/04/1981, 42 y.o., female Today's Date: 09/15/2023   PCP: Ivonne Andrew, NP REFERRING PROVIDER: Ivonne Andrew, NP  END OF SESSION:  PT End of Session - 09/15/23 1410     Visit Number 5    Number of Visits 7   with eval   Date for PT Re-Evaluation 10/02/23    Authorization Type Medicaid [employer]    PT Start Time 1409    PT Stop Time 1447    PT Time Calculation (min) 38 min    Activity Tolerance Patient tolerated treatment well    Behavior During Therapy WFL for tasks assessed/performed                 No past medical history on file. No past surgical history on file. Patient Active Problem List   Diagnosis Date Noted   Positive ANA (antinuclear antibody) 08/06/2023   Muscle pain 07/30/2023    ONSET DATE: 07/29/2023  REFERRING DIAG: R53.81 (ICD-10-CM) - Physical deconditioning  THERAPY DIAG:  Physical deconditioning  Other abnormalities of gait and mobility  Muscle weakness (generalized)  Rationale for Evaluation and Treatment: Rehabilitation  SUBJECTIVE:                                                                                                                                                                                             SUBJECTIVE STATEMENT: Pt goes by "[name]"  Patient expresses that a loved one has passed this prior October 29, 2023, becomes tearful but wants to continue with therapy. Today she feels feverish, which is a common occurrence with long Covid. She has not been able to do much walking, but has been doing her HEP exercises. Reports no falls or acute changes.  "The bike was relaxing, the treadmill pushed me"  Pt accompanied by: self  PERTINENT HISTORY: Positive ANA, arthralgia, anemia, B12 deficiency, mixed hyperlipidemia, prediabetes  PAIN:  Are you having pain?  Feeling feverish today- clammy, feels it in mouth     PRECAUTIONS: None  RED FLAGS: None   WEIGHT BEARING RESTRICTIONS: No  FALLS: Has patient fallen in last 6 months? No  LIVING ENVIRONMENT: Lives with: lives alone Lives in: House/apartment Stairs: Yes: Internal: 12 steps; on left going up and External: 2 steps; none Has following equipment at home:  "stick that I found outside that I use as a cane"  PLOF: Independent with gait and Independent with transfers  PATIENT GOALS: immediate goal: be able to walk to/from bus stop to increase independence with community mobility (wants to be able to come  to therapy without needing a ride); LTG: return to jogging (dancing in the woods with music)  OBJECTIVE:   DIAGNOSTIC FINDINGS: None relevant to this POC  COGNITION: Overall cognitive status: Within functional limits for tasks assessed   SENSATION: WFL Light touch and proprioception WFL  COORDINATION: WFL  LOWER EXTREMITY ROM:     Active  Right Eval Left Eval  Hip flexion [facility]  Hip extension    Hip abduction    Hip adduction    Hip internal rotation    Hip external rotation    Knee flexion [facility] WFL  Knee extension [facility]  Ankle dorsiflexion [facility] WFL  Ankle plantarflexion    Ankle inversion    Ankle eversion     (Blank rows = not tested)  LOWER EXTREMITY MMT:    MMT Right Eval Left Eval  Hip flexion 5 5  Hip extension    Hip abduction    Hip adduction    Hip internal rotation    Hip external rotation    Knee flexion 5 5  Knee extension 5 5  Ankle dorsiflexion 5 5  Ankle plantarflexion    Ankle inversion    Ankle eversion    (Blank rows = not tested)    TODAY'S TREATMENT:                                                                                                                              TherEx Supine Shoulder Flexion on towel roll x10 reps  Supine lumbar rotation x10 reps bilaterally  Supine pec stretch clavicular head x2 reps x30 second hold Patient reports feeling on R>L  Supine  marching x15 reps bilaterally Instructed to activate core and find rotational neutral  Sidelying Clamshells x10 reps bilaterally Instructed to use heel of hand to feel glute med contraction and keep hips from rolling backwards  After completion of supine and sidelying exercises: Vitals: SPO2 99%, HR 127 bpm  SciFit multi-peaks level 2.5 progressing to 3.0 for 6 minutes using BUE/BLEs for neural priming for reciprocal movement, cardiovascular tolerance and increased amplitude of stepping. RPE of 5/10 following activity   After completion, Vitals: SPO2 99%, HR 127 bpm, felt joyful "tingly"   PATIENT EDUCATION: Education details: continue HEP and neighborhood walks, added to [employer]  Person educated: Patient Education method: Programmer, multimedia, Demonstration, Actor cues, Verbal cues, and Handouts Education comprehension: verbalized understanding, returned demonstration, and needs further education  HOME EXERCISE PROGRAM: Access Code: [email or web address] URL: [email or web address] Date: 08/19/2023 Prepared by: Peter Congo  Exercises - Side Stepping with Resistance at Ankles  - 1 x daily - 7 x weekly - 3 sets - 10 reps - Forward Monster Walk with Resistance (BKA)  - 1 x daily - 7 x weekly - 3 sets - 10 reps - Supine Lower Trunk Rotation  - 1 x daily - 7 x weekly - 3 sets - 10 reps - Sidelying Open Book Thoracic Lumbar Rotation and Extension  -  1 x daily - 7 x weekly - 3 sets - 10 reps - Child's Pose Stretch  - 1 x daily - 7 x weekly - 1 sets - 5 reps - 30 sec hold - Child's Pose with Sidebending  - 1 x daily - 7 x weekly - 1 sets - 5 reps - 30 sec hold - Clamshell  - 1 x daily - 4 x weekly - 2 sets - 10 reps - Supine Thoracic Mobilization Towel Roll Vertical with Arm Stretch  - 1 x daily - 7 x weekly - 1 sets - 3 reps - 30 second hold   GOALS: Goals reviewed with patient? Yes  SHORT TERM GOALS: Target date: 08/29/2023  Pt will be independent with initial HEP for improved strength,  balance, and endurance. Baseline: Goal status: MET  2.  Pt will ambulate greater than or equal to 400 feet on with no AD and mod I for improved cardiovascular endurance and BLE strength.  Baseline: 326 ft (8/23), 373 ft (9/17) Goal status: IN PROGRESS  3.  Pt will improve 30 sec STS to greater than or equal to 10 stands to demonstrate improved functional strength, transfer efficiency, and endurance.  Baseline: 8 stands (8/23), 8 stands (9/17) Goal status: IN PROGRESS   LONG TERM GOALS: Target date: 09/19/2023   Pt will be independent with final HEP and walking program for improved strength, balance, and endurance. Baseline:  Goal status: INITIAL  2.  Pt will ambulate greater than or equal to 475 feet on with no AD and mod I for improved cardiovascular endurance and BLE strength.  Baseline: 326 ft (8/23) Goal status: INITIAL  3.  Pt will improve 30 sec STS to greater than or equal to 12 stands to demonstrate improved functional strength, transfer efficiency, and endurance.  Baseline: 8 stands (8/22) Goal status: INITIAL  4.  Pt will be able to ambulate >/= 1000 ft outdoors across unlevel surfaces with RPE </= 5/10 in order to demonstrate return to community mobility Baseline: 326 ft RPE 8/10 (8/23) Goal status: INITIAL    ASSESSMENT:  CLINICAL IMPRESSION: Emphasis of skilled PT session on global endurance training, stretching, and strengthening for pain management and improved mobility. Pt is able to tolerate and enjoys SciFit biking level 3.0 for 6 minutes. Continue POC.    OBJECTIVE IMPAIRMENTS: cardiopulmonary status limiting activity, decreased activity tolerance, decreased endurance, decreased knowledge of condition, decreased mobility, and impaired perceived functional ability.   ACTIVITY LIMITATIONS: carrying, lifting, bending, standing, and stairs  PARTICIPATION LIMITATIONS: meal prep, cleaning, laundry, interpersonal relationship, shopping, community  activity, occupation, and yard work  PERSONAL FACTORS: Time since onset of injury/illness/exacerbation, Transportation, and 1-2 comorbidities:    Positive ANA, arthralgia, anemia, B12 deficiency, mixed hyperlipidemia, prediabetesare also affecting patient's functional outcome.   REHAB POTENTIAL: Good  CLINICAL DECISION MAKING: Stable/uncomplicated  EVALUATION COMPLEXITY: Low  PLAN:  PT FREQUENCY: 1x/week  PT DURATION: 6 weeks  PLANNED INTERVENTIONS: Therapeutic exercises, Therapeutic activity, Neuromuscular re-education, Balance training, Gait training, Patient/Family education, Self Care, Joint mobilization, Stair training, DME instructions, Dry Needling, Cryotherapy, Moist heat, Taping, Manual therapy, and Re-evaluation  PLAN FOR NEXT SESSION: work on Radiographer, therapeutic (SciFit vs NuStep vs elliptical vs treadmill with vitals monitoring), carrying weights, squats, sit to stands on various surfaces, seated on therapy ball, Anterior leans over therapy ball, Yoga poses, Hip stretches, supine v prone v sidelying v quadruped body weight strengthening, circuits  Wants to try DN again next visit "if I don't  feel feverish and no one has died"   Check all possible CPT codes: 16109 - PT Re-evaluation, 97110- Therapeutic Exercise, [phone]- Neuro Re-education, [phone] - Gait Training, [phone] - Manual Therapy, [phone] - Therapeutic Activities, [phone] - Self Care, and [phone] - Physical performance training    Check all conditions that are expected to impact treatment: {Conditions expected to impact treatment:Medical complications related to COVID-19   If treatment provided at initial evaluation, no treatment charged due to lack of authorization.        Beverely Low, SPT  Peter Congo, PT, DPT, CSRS  09/15/2023, 3:07 PM

## 2023-09-15 NOTE — Progress Notes (Unsigned)
[facility] CONSULT NOTE  Patient Care Team: Ivonne Andrew, NP as PCP - General (Pulmonary Disease)  ASSESSMENT & PLAN:  No problem-specific Assessment & Plan notes found for this encounter.  Orders Placed This Encounter  Procedures   Vitamin B12    Standing Status:   Future    Number of Occurrences:   1    Standing Expiration Date:   09/16/2024   Folate    Standing Status:   Future    Number of Occurrences:   1    Standing Expiration Date:   09/16/2024   Ferritin    Standing Status:   Future    Number of Occurrences:   1    Standing Expiration Date:   09/16/2024   Methylmalonic acid, serum   Homocysteine, serum    Standing Status:   Future    Number of Occurrences:   1    Standing Expiration Date:   09/16/2024   CBC with Differential (Cancer Center Only)    Standing Status:   Future    Standing Expiration Date:   09/16/2024   Ferritin    Standing Status:   Future    Standing Expiration Date:   09/16/2024   Vitamin B12    Standing Status:   Future    Standing Expiration Date:   09/16/2024    Relevant history: Heavy menstrual bleeding Last colonoscopy: Not yet Last EGD: None yet  Iron deficiency anemia Monoferric ordered Iron, TIBC, ferritin, iron saturation, B12, folate Repeat ferritin and CBC in December  Low B12 Check MMA, folate and homocystine  All questions were answered. The patient knows to call the clinic with any problems, questions or concerns. Lab appt on 12/3 and see me on 11/19/23  Melven Sartorius, MD 10/3/20243:42 PM   CHIEF COMPLAINTS/PURPOSE OF CONSULTATION:  Anemia  HISTORY OF PRESENTING ILLNESS:  Kelli Cooper 42 y.o. female is here because of anemia  Aletha had not noticed any recent bleeding such as epistaxis, hematuria or hematochezia.  Menstrual cycles: heavy.  Patient reports heavy menstrual cycles changing multiple pads a day.  No other source of bleeding.  Overall feeling very fatigued.  There is no family  history of gastric, or colon cancer.  She denies any change in bowel or stool patterns.  Laboratory studies show severe iron deficiency anemia.  Ferritin was 5 in August 2024.  B12 was borderline low.  Microcytosis.  MEDICAL HISTORY:  No past medical history on file.  SURGICAL HISTORY: No past surgical history on file.  SOCIAL HISTORY: Social History   Socioeconomic History   Marital status: Single    Spouse name: Not on file   Number of children: Not on file   Years of education: Not on file   Highest education level: Not on file  Occupational History   Not on file  Tobacco Use   Smoking status: Never   Smokeless tobacco: Never  Substance and Sexual Activity   Alcohol use: Not on file   Drug use: Yes    Types: Marijuana   Sexual activity: Not on file  Other Topics Concern   Not on file  Social History Narrative   Not on file   Social Determinants of Health   Financial Resource Strain: Not on file  Food Insecurity: Not on file  Transportation Needs: Not on file  Physical Activity: Not on file  Stress: Not on file  Social Connections: Not on file  Intimate Partner Violence: Not on file  FAMILY HISTORY: No family history on file.  ALLERGIES:  has No Known Allergies.  MEDICATIONS:  Current Outpatient Medications  Medication Sig Dispense Refill   b complex vitamins capsule Take 1 capsule by mouth daily.     ibuprofen (ADVIL) 200 MG tablet Take 200 mg by mouth every 6 (six) hours as needed.     Iron, Ferrous Sulfate, 325 (65 Fe) MG TABS Take 325 mg by mouth daily. 30 tablet 1   rosuvastatin (CRESTOR) 10 MG tablet Take 1 tablet (10 mg total) by mouth daily. 90 tablet 3   cyclobenzaprine (FLEXERIL) 5 MG tablet Take 1 tablet (5 mg total) by mouth at bedtime as needed for muscle spasms. (Patient not taking: Reported on 07/29/2023) 5 tablet 0   naproxen sodium (ALEVE) 220 MG tablet Take 220 mg by mouth.     traMADol (ULTRAM) 50 MG tablet Take 1 tablet (50 mg total)  by mouth every 12 (twelve) hours as needed. (Patient not taking: Reported on 07/29/2023) 5 tablet 0   No current facility-administered medications for this visit.    REVIEW OF SYSTEMS:   As above  PHYSICAL EXAMINATION:  Vitals:   09/17/23 1239  BP: 114/80  Pulse: 96  Resp: 20  Temp: 98.1 F (36.7 C)  SpO2: 100%   Filed Weights   09/17/23 1239  Weight: 227 lb 3.2 oz (103.1 kg)    GENERAL: alert, no distress and comfortable SKIN: skin color normal EYES: normal, conjunctiva are pink, sclera clear  RADIOGRAPHIC STUDIES: I have personally reviewed the radiological images as listed and agreed with the findings in the report. No results found.

## 2023-09-17 ENCOUNTER — Inpatient Hospital Stay: Payer: Medicaid Other

## 2023-09-17 ENCOUNTER — Other Ambulatory Visit: Payer: Medicaid Other

## 2023-09-17 ENCOUNTER — Encounter: Payer: Medicaid Other | Admitting: Hematology

## 2023-09-17 ENCOUNTER — Inpatient Hospital Stay: Payer: Medicaid Other | Attending: Hematology

## 2023-09-17 VITALS — BP 114/80 | HR 96 | Temp 98.1°F | Resp 20 | Wt 227.2 lb

## 2023-09-17 DIAGNOSIS — D500 Iron deficiency anemia secondary to blood loss (chronic): Secondary | ICD-10-CM | POA: Insufficient documentation

## 2023-09-17 DIAGNOSIS — N920 Excessive and frequent menstruation with regular cycle: Secondary | ICD-10-CM | POA: Insufficient documentation

## 2023-09-17 DIAGNOSIS — D509 Iron deficiency anemia, unspecified: Secondary | ICD-10-CM | POA: Insufficient documentation

## 2023-09-17 DIAGNOSIS — E538 Deficiency of other specified B group vitamins: Secondary | ICD-10-CM

## 2023-09-17 LAB — VITAMIN B12: Vitamin B-12: 460 pg/mL (ref 180–914)

## 2023-09-17 LAB — FERRITIN: Ferritin: 16 ng/mL (ref 11–307)

## 2023-09-17 LAB — FOLATE: Folate: 8.6 ng/mL (ref >5.9)

## 2023-09-18 LAB — HOMOCYSTEINE: Homocysteine: 8.7 umol/L (ref 0.0–14.5)

## 2023-09-25 LAB — METHYLMALONIC ACID, SERUM: Methylmalonic Acid, Quantitative: 149 nmol/L (ref 0–378)

## 2023-09-28 ENCOUNTER — Ambulatory Visit: Payer: Self-pay | Admitting: Physical Therapy

## 2023-09-30 ENCOUNTER — Ambulatory Visit: Payer: Medicaid Other | Admitting: Physical Therapy

## 2023-09-30 DIAGNOSIS — R5381 Other malaise: Secondary | ICD-10-CM

## 2023-09-30 DIAGNOSIS — M6281 Muscle weakness (generalized): Secondary | ICD-10-CM

## 2023-09-30 DIAGNOSIS — R2689 Other abnormalities of gait and mobility: Secondary | ICD-10-CM | POA: Diagnosis not present

## 2023-09-30 NOTE — Therapy (Cosign Needed)
OUTPATIENT PHYSICAL THERAPY NEURO TREATMENT- RE CERTIFICATION   Patient Name: Kelli Cooper MRN: [id number] DOB:1981-01-31, 42 y.o., female Today's Date: 09/30/2023   PCP: Ivonne Andrew, NP REFERRING PROVIDER: Ivonne Andrew, NP  END OF SESSION:  PT End of Session - 09/30/23 1617     Visit Number 6    Number of Visits 11   RECERT   Date for PT Re-Evaluation 10/30/23   recert   Authorization Type Medicaid [employer]    PT Start Time 1617    PT Stop Time 1700    PT Time Calculation (min) 43 min    Activity Tolerance Patient tolerated treatment well    Behavior During Therapy [facility] for tasks assessed/performed                  No past medical history on file. No past surgical history on file. Patient Active Problem List   Diagnosis Date Noted   Iron deficiency anemia 09/17/2023   Positive ANA (antinuclear antibody) 08/06/2023   Muscle pain 07/30/2023    ONSET DATE: 07/29/2023  REFERRING DIAG: R53.81 (ICD-10-CM) - Physical deconditioning  THERAPY DIAG:  Physical deconditioning  Other abnormalities of gait and mobility  Muscle weakness (generalized)  Rationale for Evaluation and Treatment: Rehabilitation  SUBJECTIVE:                                                                                                                                                                                             SUBJECTIVE STATEMENT:  Pt goes by "[name]"  Recent doctors appointment pt learned her Hematologist is worried about her anemia, she needs to see gynecology and maybe get a hysterectomy. Pt has noticed that since starting PT her HR is more normal, she does not have as much difficulty doing stairs, and she walked a block w/o needing cane- just carried it-- has progressed to 2 blocks.  Might get an iron injection.   Pt accompanied by: self  PERTINENT HISTORY: Positive ANA, arthralgia, anemia, B12 deficiency, mixed hyperlipidemia,  prediabetes  PAIN:  Are you having pain? No   PRECAUTIONS: None  RED FLAGS: None   WEIGHT BEARING RESTRICTIONS: No  FALLS: Has patient fallen in last 6 months? No  LIVING ENVIRONMENT: Lives with: lives alone Lives in: House/apartment Stairs: Yes: Internal: 12 steps; on left going up and External: 2 steps; none Has following equipment at home:  "stick that I found outside that I use as a cane"  PLOF: Independent with gait and Independent with transfers  PATIENT GOALS: immediate goal: be able to walk to/from bus stop to increase independence with community mobility (  wants to be able to come to therapy without needing a ride); LTG: return to jogging (dancing in the woods with music)  OBJECTIVE:   DIAGNOSTIC FINDINGS: None relevant to this POC  COGNITION: Overall cognitive status: Within functional limits for tasks assessed   SENSATION: WFL Light touch and proprioception WFL  COORDINATION: WFL  LOWER EXTREMITY ROM:     Active  Right Eval Left Eval  Hip flexion [facility]  Hip extension    Hip abduction    Hip adduction    Hip internal rotation    Hip external rotation    Knee flexion [facility] WFL  Knee extension [facility]  Ankle dorsiflexion [facility] WFL  Ankle plantarflexion    Ankle inversion    Ankle eversion     (Blank rows = not tested)  LOWER EXTREMITY MMT:    MMT Right Eval Left Eval  Hip flexion 5 5  Hip extension    Hip abduction    Hip adduction    Hip internal rotation    Hip external rotation    Knee flexion 5 5  Knee extension 5 5  Ankle dorsiflexion 5 5  Ankle plantarflexion    Ankle inversion    Ankle eversion    (Blank rows = not tested)    TODAY'S TREATMENT:                                                                                                                              TherAct Dry Needling Trigger Point Dry-Needling  Treatment instructions: Expect mild to moderate muscle soreness. S/S of pneumothorax if dry needled over a  lung field, and to seek immediate medical attention should they occur. Patient verbalized understanding of these instructions and education.  Patient Consent Given: Yes Education handout provided: Previously provided Muscles treated: L and R splenius capitus Treatment response/outcome: deep ache/muscle cramp: muscle twitch detected   LTG Assessment 2 minute walk test: 468 ft RPE 4/10 30 second sit to stand: 10 stands  TherEx SciFit multi-peaks level 3.0->3.5 for 9 minutes using BUE/BLEs for neural priming for reciprocal movement, dynamic cardiovascular warmup and increased amplitude of stepping. RPE of 5/10 following activity     PATIENT EDUCATION:  Education details: continue HEP and neighborhood walks, outcome measures, implications of anemia on POC Person educated: Patient Education method: Explanation Education comprehension: verbalized understanding and needs further education  HOME EXERCISE PROGRAM: Access Code: [email or web address] URL: [email or web address] Date: 08/19/2023 Prepared by: Peter Congo  Exercises - Side Stepping with Resistance at Ankles  - 1 x daily - 7 x weekly - 3 sets - 10 reps - Forward Monster Walk with Resistance (BKA)  - 1 x daily - 7 x weekly - 3 sets - 10 reps - Supine Lower Trunk Rotation  - 1 x daily - 7 x weekly - 3 sets - 10 reps - Sidelying Open Book Thoracic Lumbar Rotation and Extension  - 1 x daily - 7 x weekly -  3 sets - 10 reps - Child's Pose Stretch  - 1 x daily - 7 x weekly - 1 sets - 5 reps - 30 sec hold - Child's Pose with Sidebending  - 1 x daily - 7 x weekly - 1 sets - 5 reps - 30 sec hold - Clamshell  - 1 x daily - 4 x weekly - 2 sets - 10 reps - Supine Thoracic Mobilization Towel Roll Vertical with Arm Stretch  - 1 x daily - 7 x weekly - 1 sets - 3 reps - 30 second hold   GOALS: Goals reviewed with patient? Yes  SHORT TERM GOALS: Target date: 08/29/2023  Pt will be independent with initial HEP for improved strength,  balance, and endurance. Baseline: Goal status: MET  2.  Pt will ambulate greater than or equal to 400 feet on with no AD and mod I for improved cardiovascular endurance and BLE strength.  Baseline: 326 ft (8/23), 373 ft (9/17) Goal status: MET (10/16)  3.  Pt will improve 30 sec STS to greater than or equal to 10 stands to demonstrate improved functional strength, transfer efficiency, and endurance.  Baseline: 8 stands (8/23), 8 stands (9/17) Goal status: MET (10/16)   LONG TERM GOALS: Target date: 09/19/2023    Pt will be independent with final HEP and walking program for improved strength, balance, and endurance. Baseline: 10/16 "I won't be stopping that anytime soon" Goal status: MET  2.  Pt will ambulate greater than or equal to 475 feet on with no AD and mod I for improved cardiovascular endurance and BLE strength.  Baseline: 326 ft (8/23), 468 ft (10/16) RPE 4/10 Goal status: PROGRESSING  3.  Pt will improve 30 sec STS to greater than or equal to 12 stands to demonstrate improved functional strength, transfer efficiency, and endurance.  Baseline: 8 stands (8/22), 10 stands (10/16) Goal status: PROGRESSING  4.  Pt will be able to ambulate >/= 1000 ft outdoors across unlevel surfaces with RPE </= 5/10 in order to demonstrate return to community mobility Baseline: 326 ft RPE 8/10 (8/23), "I just increased it from 1000 ft to 2000" (10/16) Goal status: MET    NEW SHORT TERM GOALS= LONG TERM GOALS  NEW LONG TERM GOALS:  Target date:  10/30/2023  Pt will ambulate greater than or equal to 1800 feet (norm for age group) on with no AD and mod I for improved cardiovascular endurance and BLE strength. Baseline: 468 ft (09/30/23) Goal status: INITIAL  2.  Pt will improve 30 sec STS to greater than or equal to 12 stands to demonstrate improved functional strength, transfer efficiency, and endurance. Baseline: 10 stands (09/30/23) Goal status: INITIAL  3.   Administer Step Test w/ HR Response, set goal as appropriate Baseline: unknown Goal status: INITIAL   ASSESSMENT:  CLINICAL IMPRESSION:  Emphasis of skilled PT session on dry needling for cervical musculature trigger point relief, LTG assessment, and endurance exercise. This date, 2/4 LTGs met and 2/4 progressing demonstrated in good adherence to HEP, increased walking in community, increasing 30 sec STS to 10 stands and increasing to 468 ft w/ RPE of 4/10. Pt tolerated dry needling well with no nausea, preferring to only have cervical musculature done today and deferring traps to another session. Continue POC.    OBJECTIVE IMPAIRMENTS: cardiopulmonary status limiting activity, decreased activity tolerance, decreased endurance, decreased knowledge of condition, decreased mobility, and impaired perceived functional ability.   ACTIVITY LIMITATIONS: carrying, lifting,  bending, standing, and stairs  PARTICIPATION LIMITATIONS: meal prep, cleaning, laundry, interpersonal relationship, shopping, community activity, occupation, and yard work  PERSONAL FACTORS: Time since onset of injury/illness/exacerbation, Transportation, and 1-2 comorbidities:    Positive ANA, arthralgia, anemia, B12 deficiency, mixed hyperlipidemia, prediabetesare also affecting patient's functional outcome.   REHAB POTENTIAL: Good  CLINICAL DECISION MAKING: Stable/uncomplicated  EVALUATION COMPLEXITY: Low  PLAN:  PT FREQUENCY: 1x/week  PT DURATION: 6 weeks+ 4 weeks (recert)  PLANNED INTERVENTIONS: Therapeutic exercises, Therapeutic activity, Neuromuscular re-education, Balance training, Gait training, Patient/Family education, Self Care, Joint mobilization, Stair training, DME instructions, Dry Needling, Cryotherapy, Moist heat, Taping, Manual therapy, and Re-evaluation  PLAN FOR NEXT SESSION: work on Radiographer, therapeutic (SciFit vs NuStep vs elliptical vs treadmill with vitals monitoring), carrying weights,  squats, sit to stands on various surfaces, seated on therapy ball, Anterior leans over therapy ball, Yoga poses, Hip stretches, supine v prone v sidelying v quadruped body weight strengthening, circuits, DN traps      Check all possible CPT codes: 09604 - PT Re-evaluation, 97110- Therapeutic Exercise, [phone]- Neuro Re-education, [phone] - Gait Training, [phone] - Manual Therapy, 97530 - Therapeutic Activities, 97535 - Self Care, and 97750 - Physical performance training    Check all conditions that are expected to impact treatment: {Conditions expected to impact treatment:Medical complications related to COVID-19   If treatment provided at initial evaluation, no treatment charged due to lack of authorization.        Beverely Low, SPT  Peter Congo, PT, DPT, CSRS  09/30/2023, 5:07 PM

## 2023-10-01 NOTE — Addendum Note (Signed)
Addended by: Peter Congo on: 10/01/2023 08:35 AM   Modules accepted: Orders

## 2023-10-05 ENCOUNTER — Ambulatory Visit: Payer: Medicaid Other | Admitting: Physical Therapy

## 2023-10-05 DIAGNOSIS — R5381 Other malaise: Secondary | ICD-10-CM | POA: Diagnosis not present

## 2023-10-05 DIAGNOSIS — R2689 Other abnormalities of gait and mobility: Secondary | ICD-10-CM

## 2023-10-05 DIAGNOSIS — M6281 Muscle weakness (generalized): Secondary | ICD-10-CM | POA: Diagnosis not present

## 2023-10-05 NOTE — Therapy (Addendum)
OUTPATIENT PHYSICAL THERAPY NEURO TREATMENT   Patient Name: Kelli Cooper MRN: [id number] DOB:November 21, 1981, 42 y.o., female Today's Date: 10/05/2023   PCP: Ivonne Andrew, NP REFERRING PROVIDER: Ivonne Andrew, NP  END OF SESSION:  PT End of Session - 10/05/23 1451     Visit Number 7    Number of Visits 11   RECERT   Date for PT Re-Evaluation 10/30/23   recert   Authorization Type Medicaid [employer]    PT Start Time 1451    PT Stop Time 1530    PT Time Calculation (min) 39 min    Activity Tolerance Patient tolerated treatment well    Behavior During Therapy [facility] for tasks assessed/performed                   No past medical history on file. No past surgical history on file. Patient Active Problem List   Diagnosis Date Noted   Iron deficiency anemia 09/17/2023   Positive ANA (antinuclear antibody) 08/06/2023   Muscle pain 07/30/2023    ONSET DATE: 07/29/2023  REFERRING DIAG: R53.81 (ICD-10-CM) - Physical deconditioning  THERAPY DIAG:  Physical deconditioning  Other abnormalities of gait and mobility  Muscle weakness (generalized)  Rationale for Evaluation and Treatment: Rehabilitation  SUBJECTIVE:                                                                                                                                                                                             SUBJECTIVE STATEMENT:  Pt goes by "Kelli Cooper"  Feeling well, just tired. Denies falls or other acute changes. Is interested in dry needling today. HEP is going well, as is walking around 2 blocks.   Pt accompanied by: self  PERTINENT HISTORY: Positive ANA, arthralgia, anemia, B12 deficiency, mixed hyperlipidemia, prediabetes  PAIN:  Are you having pain?  "A little bit in my shoulder"    PRECAUTIONS: None  RED FLAGS: None   WEIGHT BEARING RESTRICTIONS: No  FALLS: Has patient fallen in last 6 months? No  LIVING ENVIRONMENT: Lives with: lives  alone Lives in: House/apartment Stairs: Yes: Internal: 12 steps; on left going up and External: 2 steps; none Has following equipment at home:  "stick that I found outside that I use as a cane"  PLOF: Independent with gait and Independent with transfers  PATIENT GOALS: immediate goal: be able to walk to/from bus stop to increase independence with community mobility (wants to be able to come to therapy without needing a ride); LTG: return to jogging (dancing in the woods with music)  OBJECTIVE:   DIAGNOSTIC FINDINGS: None relevant to this POC  COGNITION: Overall cognitive status: Within functional limits for tasks assessed   SENSATION: WFL Light touch and proprioception WFL  COORDINATION: WFL  LOWER EXTREMITY ROM:     Active  Right Eval Left Eval  Hip flexion [facility]  Hip extension    Hip abduction    Hip adduction    Hip internal rotation    Hip external rotation    Knee flexion [facility] WFL  Knee extension [facility]  Ankle dorsiflexion [facility] WFL  Ankle plantarflexion    Ankle inversion    Ankle eversion     (Blank rows = not tested)  LOWER EXTREMITY MMT:    MMT Right Eval Left Eval  Hip flexion 5 5  Hip extension    Hip abduction    Hip adduction    Hip internal rotation    Hip external rotation    Knee flexion 5 5  Knee extension 5 5  Ankle dorsiflexion 5 5  Ankle plantarflexion    Ankle inversion    Ankle eversion    (Blank rows = not tested)    TODAY'S TREATMENT:                                                                                                                              TherAct Dry Needling Trigger Point Dry-Needling  Treatment instructions: Expect mild to moderate muscle soreness. S/S of pneumothorax if dry needled over a lung field, and to seek immediate medical attention should they occur. Patient verbalized understanding of these instructions and education.  Patient Consent Given: Yes Education handout provided: Previously  provided Muscles treated: upper trapezius Treatment response/outcome: deep ache/muscle cramp: muscle twitch detected   [place] Step Test 3 min at 96 BPM stepping to 8" step Stop, take Hrs: 30 sec HR: 139bpm, 98% O2 78% max HR (moderate to high-intensity) 1 min HR: 120bpm, 99% O2 2 min HR: 104 bpm, 99% O2 3 min HR: 102 bpm, 99% O2 58% max HR   Calculated Vo86max w/ resting HR of 70: 38.5 (average for gender and age)    TherEx SciFit multi-peaks level 3.0 for 10 minutes using BUE/BLEs for neural priming for reciprocal movement, dynamic cardiovascular endurance training and increased amplitude of stepping. RPE of 8/10 following activity      PATIENT EDUCATION:  Education details: continue HEP and neighborhood walks, outcome measures Person educated: Patient Education method: Explanation Education comprehension: verbalized understanding and needs further education  HOME EXERCISE PROGRAM: Access Code: [email or web address] URL: [email or web address] Date: 08/19/2023 Prepared by: Peter Congo  Exercises - Side Stepping with Resistance at Ankles  - 1 x daily - 7 x weekly - 3 sets - 10 reps - Forward Monster Walk with Resistance (BKA)  - 1 x daily - 7 x weekly - 3 sets - 10 reps - Supine Lower Trunk Rotation  - 1 x daily - 7 x weekly - 3 sets - 10 reps - Sidelying Open Book Thoracic Lumbar Rotation and  Extension  - 1 x daily - 7 x weekly - 3 sets - 10 reps - Child's Pose Stretch  - 1 x daily - 7 x weekly - 1 sets - 5 reps - 30 sec hold - Child's Pose with Sidebending  - 1 x daily - 7 x weekly - 1 sets - 5 reps - 30 sec hold - Clamshell  - 1 x daily - 4 x weekly - 2 sets - 10 reps - Supine Thoracic Mobilization Towel Roll Vertical with Arm Stretch  - 1 x daily - 7 x weekly - 1 sets - 3 reps - 30 second hold   GOALS: Goals reviewed with patient? Yes   NEW SHORT TERM GOALS= LONG TERM GOALS  NEW LONG TERM GOALS:  Target date:  10/30/2023  Pt will ambulate greater than or  equal to 1200 feet on with no AD and mod I for improved cardiovascular endurance and BLE strength. Baseline: 468 ft (09/30/23) Goal status: INITIAL  2.  Pt will improve 30 sec STS to greater than or equal to 12 stands to demonstrate improved functional strength, transfer efficiency, and endurance. Baseline: 10 stands (09/30/23) Goal status: INITIAL  3.  At three minutes post [facility] test, pt will have a hr less than or equal to 88 bpm to indicate improved heart rate recovery and exercise tolerance.  Baseline: 102 bpm Goal status: INITIAL   ASSESSMENT:  CLINICAL IMPRESSION:  Emphasis of skilled PT session on dry needling for cervical and shoulder musculature trigger point relief, cardiac endurance assessment, and endurance exercise. This date, pt demonstrates a good heart rate recovery (HRR) of 19 BPM in the first minute (normal is at least 12), however does not recover to a resting heart rate within 3 minutes.  Pt tolerated dry needling well with no nausea, preferring to only have bilateral upper trapezius done today. Continue POC.    OBJECTIVE IMPAIRMENTS: cardiopulmonary status limiting activity, decreased activity tolerance, decreased endurance, decreased knowledge of condition, decreased mobility, and impaired perceived functional ability.   ACTIVITY LIMITATIONS: carrying, lifting, bending, standing, and stairs  PARTICIPATION LIMITATIONS: meal prep, cleaning, laundry, interpersonal relationship, shopping, community activity, occupation, and yard work  PERSONAL FACTORS: Time since onset of injury/illness/exacerbation, Transportation, and 1-2 comorbidities:    Positive ANA, arthralgia, anemia, B12 deficiency, mixed hyperlipidemia, prediabetesare also affecting patient's functional outcome.   REHAB POTENTIAL: Good  CLINICAL DECISION MAKING: Stable/uncomplicated  EVALUATION COMPLEXITY: Low  PLAN:  PT FREQUENCY: 1x/week  PT DURATION: 6 weeks+ 4 weeks  (recert)  PLANNED INTERVENTIONS: Therapeutic exercises, Therapeutic activity, Neuromuscular re-education, Balance training, Gait training, Patient/Family education, Self Care, Joint mobilization, Stair training, DME instructions, Dry Needling, Cryotherapy, Moist heat, Taping, Manual therapy, and Re-evaluation  PLAN FOR NEXT SESSION: work on Radiographer, therapeutic (SciFit vs NuStep vs elliptical vs treadmill with vitals monitoring), carrying weights, squats, sit to stands on various surfaces, seated on therapy ball, Anterior leans over therapy ball, Yoga poses, Hip stretches, supine v prone v sidelying v quadruped body weight strengthening, circuits, DN, discharge    Check all possible CPT codes: 45409 - PT Re-evaluation, 97110- Therapeutic Exercise, [phone]- Neuro Re-education, [phone] - Gait Training, [phone] - Manual Therapy, 97530 - Therapeutic Activities, 97535 - Self Care, and 97750 - Physical performance training    Check all conditions that are expected to impact treatment: {Conditions expected to impact treatment:Medical complications related to COVID-19   If treatment provided at initial evaluation, no treatment charged due to lack of authorization.  Beverely Low, SPT  Peter Congo, PT, DPT, CSRS  10/05/2023, 4:42 PM

## 2023-10-12 ENCOUNTER — Ambulatory Visit: Payer: Medicaid Other | Admitting: Physical Therapy

## 2023-10-13 DIAGNOSIS — F331 Major depressive disorder, recurrent, moderate: Secondary | ICD-10-CM | POA: Diagnosis not present

## 2023-10-13 DIAGNOSIS — F411 Generalized anxiety disorder: Secondary | ICD-10-CM | POA: Diagnosis not present

## 2023-10-13 DIAGNOSIS — F4312 Post-traumatic stress disorder, chronic: Secondary | ICD-10-CM | POA: Diagnosis not present

## 2023-10-15 ENCOUNTER — Ambulatory Visit: Payer: Medicaid Other | Admitting: Physical Therapy

## 2023-10-15 DIAGNOSIS — R5381 Other malaise: Secondary | ICD-10-CM | POA: Diagnosis not present

## 2023-10-15 DIAGNOSIS — R2689 Other abnormalities of gait and mobility: Secondary | ICD-10-CM | POA: Diagnosis not present

## 2023-10-15 DIAGNOSIS — M6281 Muscle weakness (generalized): Secondary | ICD-10-CM

## 2023-10-15 NOTE — Therapy (Addendum)
OUTPATIENT PHYSICAL THERAPY NEURO TREATMENT- DISCHARGE   Patient Name: Kelli Cooper MRN: [id number] DOB:08-18-1981, 42 y.o., female Today's Date: 10/15/2023  PHYSICAL THERAPY DISCHARGE SUMMARY  Visits from Start of Care: 8  Current functional level related to goals / functional outcomes: Independent   Remaining deficits: Cardiovascular endurance   Education / Equipment: Continue HEP   Patient agrees to discharge. Patient goals were not met. Patient is being discharged due to being pleased with the current functional level.   PCP: Ivonne Andrew, NP REFERRING PROVIDER: Ivonne Andrew, NP  END OF SESSION:  PT End of Session - 10/15/23 1359     Visit Number 8    Number of Visits 11   RECERT   Date for PT Re-Evaluation 10/30/23   recert   Authorization Type Medicaid [employer]    PT Start Time 1400    PT Stop Time 1426    PT Time Calculation (min) 26 min    Activity Tolerance Patient tolerated treatment well    Behavior During Therapy WFL for tasks assessed/performed                    Past Medical History:  Diagnosis Date   Anemia    Arthralgia    COVID    DM (diabetes mellitus) (HCC)    Iron deficiency anemia    Muscle pain    Palpitations    Positive ANA (antinuclear antibody)    SOB (shortness of breath)    Past Surgical History:  Procedure Laterality Date   NO PAST SURGERIES     Patient Active Problem List   Diagnosis Date Noted   Iron deficiency anemia 09/17/2023   Positive ANA (antinuclear antibody) 08/06/2023   Muscle pain 07/30/2023    ONSET DATE: 07/29/2023  REFERRING DIAG: R53.81 (ICD-10-CM) - Physical deconditioning  THERAPY DIAG:  Physical deconditioning  Other abnormalities of gait and mobility  Muscle weakness (generalized)  Rationale for Evaluation and Treatment: Rehabilitation  SUBJECTIVE:                                                                                                                                                                                              SUBJECTIVE STATEMENT:  Pt goes by "Kelli Cooper"  Denies falls or other acute changes. Is adjusting daily walks based on fatigue level for the day. Today, she is "Having a rough day physically."  Pt accompanied by: self  PERTINENT HISTORY: Positive ANA, arthralgia, anemia, B12 deficiency, mixed hyperlipidemia, prediabetes  PAIN:  Are you having pain?  "Muscle soreness", back, arms, legs    PRECAUTIONS: None  RED  FLAGS: None   WEIGHT BEARING RESTRICTIONS: No  FALLS: Has patient fallen in last 6 months? No  LIVING ENVIRONMENT: Lives with: lives alone Lives in: House/apartment Stairs: Yes: Internal: 12 steps; on left going up and External: 2 steps; none Has following equipment at home:  "stick that I found outside that I use as a cane"  PLOF: Independent with gait and Independent with transfers  PATIENT GOALS: immediate goal: be able to walk to/from bus stop to increase independence with community mobility (wants to be able to come to therapy without needing a ride); LTG: return to jogging (dancing in the woods with music)  OBJECTIVE:   DIAGNOSTIC FINDINGS: None relevant to this POC  COGNITION: Overall cognitive status: Within functional limits for tasks assessed   SENSATION: WFL Light touch and proprioception WFL  COORDINATION: WFL  LOWER EXTREMITY ROM:     Active  Right Eval Left Eval  Hip flexion [facility]  Hip extension    Hip abduction    Hip adduction    Hip internal rotation    Hip external rotation    Knee flexion [facility] WFL  Knee extension [facility]  Ankle dorsiflexion [facility] WFL  Ankle plantarflexion    Ankle inversion    Ankle eversion     (Blank rows = not tested)  LOWER EXTREMITY MMT:    MMT Right Eval Left Eval  Hip flexion 5 5  Hip extension    Hip abduction    Hip adduction    Hip internal rotation    Hip external rotation    Knee flexion 5 5  Knee extension 5 5  Ankle  dorsiflexion 5 5  Ankle plantarflexion    Ankle inversion    Ankle eversion    (Blank rows = not tested)    TODAY'S TREATMENT:                                                                                                                              TherAct 30 second STS assessment: 10 stands 6 MWT assessment: 1172 feet  Education provided on the importance of listening to her own body to gauge appropriate exercise intensity and duration. Emphasized recognizing signs of physical fatigue as cues to adjust or pause activity, prioritizing recovery to prevent overexertion.  Discussed strategies with patient to enhance motivation and establish achievable exercise goals. Introduced the idea of tracking daily exercise with visual cues, such as placing stickers on a calendar for each day she goes for a walk. Encouraged patient to use this goal-setting technique to gradually increase activity level while celebrating progress and establishing a positive routine for physical health.   PATIENT EDUCATION:  Education details: continue HEP and neighborhood walks, outcome measures, planning for motivation, listen to body and fatigue levels Person educated: Patient Education method: Explanation Education comprehension: verbalized understanding and needs further education  HOME EXERCISE PROGRAM: Access Code: [email or web address] URL: [email or web address] Date: 08/19/2023 Prepared by: Peter Congo  Exercises -  Side Stepping with Resistance at Ankles  - 1 x daily - 7 x weekly - 3 sets - 10 reps - Forward Monster Walk with Resistance (BKA)  - 1 x daily - 7 x weekly - 3 sets - 10 reps - Supine Lower Trunk Rotation  - 1 x daily - 7 x weekly - 3 sets - 10 reps - Sidelying Open Book Thoracic Lumbar Rotation and Extension  - 1 x daily - 7 x weekly - 3 sets - 10 reps - Child's Pose Stretch  - 1 x daily - 7 x weekly - 1 sets - 5 reps - 30 sec hold - Child's Pose with Sidebending  - 1 x daily - 7 x  weekly - 1 sets - 5 reps - 30 sec hold - Clamshell  - 1 x daily - 4 x weekly - 2 sets - 10 reps - Supine Thoracic Mobilization Towel Roll Vertical with Arm Stretch  - 1 x daily - 7 x weekly - 1 sets - 3 reps - 30 second hold   GOALS: Goals reviewed with patient? Yes   NEW SHORT TERM GOALS= LONG TERM GOALS  NEW LONG TERM GOALS:  Target date:  10/30/2023  Pt will ambulate greater than or equal to 1200 feet on with no AD and mod I for improved cardiovascular endurance and BLE strength. Baseline: 468 ft (09/30/23), 1172 feet (10/15/23) Goal status: NOT MET  2.  Pt will improve 30 sec STS to greater than or equal to 12 stands to demonstrate improved functional strength, transfer efficiency, and endurance. Baseline: 10 stands (09/30/23), 10 stands (10/15/23) Goal status: NOT MET  3.  At three minutes post [facility] test, pt will have a hr less than or equal to 88 bpm to indicate improved heart rate recovery and exercise tolerance.  Baseline: 102 bpm Goal status: INITIAL   ASSESSMENT:  CLINICAL IMPRESSION:  Emphasis of skilled PT session on assessing new LTGs for discharge. Pt has not met 2/2 LTGs assessed this date. Pt reports that today has been a rough day physically and is not surprised to have not met LTGs, feels pleased with progress. Discussed home walking program planning, goal setting, and how to gauge appropriate exercise regimine. Pt instructed to continue HEP and walking program.     OBJECTIVE IMPAIRMENTS: cardiopulmonary status limiting activity, decreased activity tolerance, decreased endurance, decreased knowledge of condition, decreased mobility, and impaired perceived functional ability.   ACTIVITY LIMITATIONS: carrying, lifting, bending, standing, and stairs  PARTICIPATION LIMITATIONS: meal prep, cleaning, laundry, interpersonal relationship, shopping, community activity, occupation, and yard work  PERSONAL FACTORS: Time since onset of  injury/illness/exacerbation, Transportation, and 1-2 comorbidities:    Positive ANA, arthralgia, anemia, B12 deficiency, mixed hyperlipidemia, prediabetesare also affecting patient's functional outcome.   REHAB POTENTIAL: Good  CLINICAL DECISION MAKING: Stable/uncomplicated  EVALUATION COMPLEXITY: Low   Check all possible CPT codes: 09811 - PT Re-evaluation, 97110- Therapeutic Exercise, [phone]- Neuro Re-education, [phone] - Gait Training, [phone] - Manual Therapy, [phone] - Therapeutic Activities, [phone] - Self Care, and [phone] - Physical performance training    Check all conditions that are expected to impact treatment: {Conditions expected to impact treatment:Medical complications related to COVID-19   If treatment provided at initial evaluation, no treatment charged due to lack of authorization.        Beverely Low, SPT  Peter Congo, PT, DPT, CSRS  10/15/2023, 2:57 PM

## 2023-10-19 ENCOUNTER — Ambulatory Visit: Payer: Medicaid Other | Admitting: Physical Therapy

## 2023-10-21 DIAGNOSIS — F4312 Post-traumatic stress disorder, chronic: Secondary | ICD-10-CM | POA: Diagnosis not present

## 2023-10-21 NOTE — Progress Notes (Signed)
Cardiology Office Note:   Date:  10/23/2023  ID:  Kelli Cooper, DOB 01/12/1981, MRN [id number] PCP:  Ivonne Andrew, NP  [facility] Providers Cardiologist:  Alverda Skeans, MD Referring MD: Ivonne Andrew, NP  Chief Complaint/Reason for Referral: Palpitations ASSESSMENT:    1. Palpitations   2. Iron deficiency anemia, unspecified iron deficiency anemia type   3. Dyslipidemia   4. CKD (chronic kidney disease) stage 2, GFR 60-89 ml/min   5. BMI 40.0-44.9, adult (HCC)   6. Prediabetes   7. Precordial pain   8. Snoring     PLAN:   In order of problems listed above: Palpitations: Patient's palpitations are getting better as she is becoming more active and continues to participate in physical therapy.  I did give the patient the option of an evaluation including a reflex TSH, CMP, CBC, echocardiogram, and monitor.  Since things are going in the right direction and her symptom burden is improving she would like to hold off on this.  She will contact our office if she would like to pursue later. Iron deficiency anemia: Continue iron supplementation; the patient is being followed by hematology. Dyslipidemia: Patient is a is on statin this is being followed by her primary care provider she does not have an indication at this point for intensive lipid lowering.  Certainly if she becomes a diabetic her LDL goal is less than 70. CKD stage II: Monitor for now. Elevated BMI: Diet and exercise modification. Prediabetes: Followed by PCP; hemoglobin A1c in August was 5.8 Chest pain: Reproducible on palpation of her chest.  Monitor for now. Snoring: Consider sleep study in the future.  The patient will think about this and perhaps talk to her PCP.             Dispo:  No follow-ups on file.      Medication Adjustments/Labs and Tests Ordered: Current medicines are reviewed at length with the patient today.  Concerns regarding medicines are outlined above.  The following changes  have been made:  no change   Labs/tests ordered: Orders Placed This Encounter  Procedures   EKG 12-Lead    Medication Changes: No orders of the defined types were placed in this encounter.   Current medicines are reviewed at length with the patient today.  The patient does not have concerns regarding medicines.    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  Seen by PCP in August and at that time she complained of palpitations.  Labs were drawn which demonstrated an anemia.  Her BNP was normal.  TSH and CMP were also normal.    The patient tells me that she was having a lot of shortness of breath, fatigue, and palpitations several months ago.  She was seen by her PCP and there was the consideration that she had developed long COVID.  She did not see a pulmonologist.  She is referred to physical therapy.  She feels much better while while participating in physical therapy.  She is able to walk much more and is less fatigued.  Her palpitations are getting better.  Overall she feels like she is going in the right direction.  She tells me she has a history of costochondritis when she  was younger.  She does get occasional chest pain when she walks.  When I palpate her chest this reproduces the pain.  She does snore and does report daytime somnolence.  She has never been tested for sleep apnea but her PCP has talked about it.        Current Medications: No outpatient medications have been marked as taking for the 10/23/23 encounter (Office Visit) with Orbie Pyo, MD.     Review of Systems:   Please see the history of present illness.    All other systems reviewed and are negative.     EKGs/Labs/Other  Test Reviewed:   EKG:    EKG Interpretation Date/Time:  Friday October 23 2023 16:20:05 EST Ventricular Rate:  65 PR Interval:  174 QRS Duration:  82 QT Interval:  372 QTC Calculation: 386 R Axis:   94  Text Interpretation: Normal sinus rhythm Rightward axis No previous ECGs available Confirmed by Alverda Skeans ([id number]) on 10/23/2023 4:25:14 PM         Risk Assessment/Calculations:          Physical Exam:   VS:  BP 110/76   Pulse 65   Ht 5\' 3"  (1.6 m)   Wt 233 lb (105.7 kg)   SpO2 98%   BMI 41.27 kg/m        Wt Readings from Last 3 Encounters:  10/23/23 233 lb (105.7 kg)  09/17/23 227 lb 3.2 oz (103.1 kg)  08/06/23 238 lb (108 kg)      GENERAL:  No apparent distress, AOx3 HEENT:  No carotid bruits, +2 carotid impulses, no scleral icterus CAR: RRR no murmurs, gallops, rubs, or thrills RES:  Clear to auscultation bilaterally ABD:  Soft, nontender, nondistended, positive bowel sounds x 4 VASC:  +2 radial pulses, +2 carotid pulses NEURO:  CN 2-12 grossly intact; motor and sensory grossly intact PSYCH:  No active depression or anxiety EXT:  No edema, ecchymosis, or cyanosis  Signed, Orbie Pyo, MD  10/23/2023 4:43 PM    [facility] [address] [place], [place], [place]  [address] Phone: [phone]; Fax: [phone]   Note:  This document was prepared using Dragon voice recognition software and may include unintentional dictation errors.

## 2023-10-23 ENCOUNTER — Encounter: Payer: Self-pay | Admitting: Internal Medicine

## 2023-10-23 ENCOUNTER — Ambulatory Visit: Payer: Medicaid Other | Attending: Internal Medicine | Admitting: Internal Medicine

## 2023-10-23 VITALS — BP 110/76 | HR 65 | Ht 63.0 in | Wt 233.0 lb

## 2023-10-23 DIAGNOSIS — R002 Palpitations: Secondary | ICD-10-CM | POA: Diagnosis not present

## 2023-10-23 DIAGNOSIS — R072 Precordial pain: Secondary | ICD-10-CM

## 2023-10-23 DIAGNOSIS — Z6841 Body Mass Index (BMI) 40.0 and over, adult: Secondary | ICD-10-CM

## 2023-10-23 DIAGNOSIS — N182 Chronic kidney disease, stage 2 (mild): Secondary | ICD-10-CM

## 2023-10-23 DIAGNOSIS — R0683 Snoring: Secondary | ICD-10-CM | POA: Diagnosis not present

## 2023-10-23 DIAGNOSIS — R7303 Prediabetes: Secondary | ICD-10-CM | POA: Diagnosis not present

## 2023-10-23 DIAGNOSIS — D509 Iron deficiency anemia, unspecified: Secondary | ICD-10-CM

## 2023-10-23 DIAGNOSIS — E785 Hyperlipidemia, unspecified: Secondary | ICD-10-CM

## 2023-10-23 NOTE — Patient Instructions (Signed)

## 2023-11-06 ENCOUNTER — Ambulatory Visit: Payer: Medicaid Other | Admitting: Nurse Practitioner

## 2023-11-06 ENCOUNTER — Encounter: Payer: Self-pay | Admitting: Nurse Practitioner

## 2023-11-06 ENCOUNTER — Other Ambulatory Visit (HOSPITAL_COMMUNITY): Payer: Self-pay

## 2023-11-06 VITALS — BP 114/77 | HR 81 | Temp 98.5°F | Resp 14 | Ht 63.0 in | Wt 233.0 lb

## 2023-11-06 DIAGNOSIS — D649 Anemia, unspecified: Secondary | ICD-10-CM

## 2023-11-06 DIAGNOSIS — R7303 Prediabetes: Secondary | ICD-10-CM | POA: Diagnosis not present

## 2023-11-06 DIAGNOSIS — N920 Excessive and frequent menstruation with regular cycle: Secondary | ICD-10-CM | POA: Diagnosis not present

## 2023-11-06 DIAGNOSIS — E782 Mixed hyperlipidemia: Secondary | ICD-10-CM | POA: Diagnosis not present

## 2023-11-06 LAB — POCT GLYCOSYLATED HEMOGLOBIN (HGB A1C): HbA1c, POC (prediabetic range): 5.1 % — AB (ref 5.7–6.4)

## 2023-11-06 MED ORDER — VITAMIN B-12 1000 MCG PO TABS
1000.0000 ug | ORAL_TABLET | Freq: Every day | ORAL | 2 refills | Status: AC
  Filled 2023-11-06: qty 30, 30d supply, fill #0

## 2023-11-06 MED ORDER — IRON (FERROUS SULFATE) 325 (65 FE) MG PO TABS
325.0000 mg | ORAL_TABLET | Freq: Every day | ORAL | 1 refills | Status: AC
  Filled 2023-11-06: qty 30, 30d supply, fill #0

## 2023-11-06 MED ORDER — ROSUVASTATIN CALCIUM 10 MG PO TABS
10.0000 mg | ORAL_TABLET | Freq: Every day | ORAL | 3 refills | Status: DC
  Filled 2023-11-06: qty 90, 90d supply, fill #0

## 2023-11-06 NOTE — Progress Notes (Signed)
Subjective   Patient ID: Kelli Cooper, female    DOB: 02-Dec-1981, 42 y.o.   MRN: [id number]  Chief Complaint  Patient presents with   Follow-up    Referring provider: Ivonne Andrew, NP  Kelli Cooper is a 42 y.o. female with Past Medical History: No date: Anemia No date: Arthralgia No date: COVID No date: DM (diabetes mellitus) (HCC) No date: Iron deficiency anemia No date: Muscle pain No date: Palpitations No date: Positive ANA (antinuclear antibody) No date: SOB (shortness of breath)   HPI  Patient presents today for follow-up and lab results.  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  Labs did reveal positive ANA.  We referred patient to rheumatology for further evaluation and treatment.  Patient was having associated arthralgias and myalgias and fatigue.  Patient was found to be anemic we will check iron panel today.  We started patient on iron supplements. Patient was also found to have elevated cholesterol.  Start patient on Crestor.  Triglycerides and A1c were elevated.  A1c in office today was 5.1. Denies f/c/s, n/v/d, hemoptysis, PND, leg swelling Denies chest pain or edema         No Known Allergies  Immunization History  Administered Date(s) Administered   PFIZER(Purple Top)SARS-COV-2 Vaccination 03/29/2020, 04/23/2020    Tobacco History: Social History   Tobacco Use  Smoking Status Never  Smokeless Tobacco Never   Counseling given: Not Answered   Outpatient Encounter Medications as of 11/06/2023  Medication Sig   b complex vitamins capsule Take 1 capsule by mouth daily.   cyanocobalamin (VITAMIN B12) 1000 MCG tablet Take 1 tablet (1,000 mcg total) by mouth daily.   ibuprofen (ADVIL) 200 MG tablet Take 200 mg by mouth every 6 (six) hours as needed.   [DISCONTINUED] Iron, Ferrous Sulfate, 325 (65 Fe) MG TABS Take 325 mg by mouth daily.   [DISCONTINUED] rosuvastatin (CRESTOR)  10 MG tablet Take 1 tablet (10 mg total) by mouth daily.   Iron, Ferrous Sulfate, 325 (65 Fe) MG TABS Take 325 mg by mouth daily.   rosuvastatin (CRESTOR) 10 MG tablet Take 1 tablet (10 mg total) by mouth daily.   No facility-administered encounter medications on file as of 11/06/2023.    Review of Systems  Review of Systems  Constitutional: Negative.   HENT: Negative.    Cardiovascular: Negative.   Gastrointestinal: Negative.   Allergic/Immunologic: Negative.   Neurological: Negative.   Psychiatric/Behavioral: Negative.       Objective:   BP 114/77 (BP Location: Left Arm, Patient Position: Sitting, Cuff Size: Normal)   Pulse 81   Temp 98.5 F (36.9 C)   Resp 14   Ht 5\' 3"  (1.6 m)   Wt 233 lb (105.7 kg)   LMP 11/05/2023   SpO2 100%   BMI 41.27 kg/m   Wt Readings from Last 5 Encounters:  11/06/23 233 lb (105.7 kg)  10/23/23 233 lb (105.7 kg)  09/17/23 227 lb 3.2 oz (103.1 kg)  08/06/23 238 lb (108 kg)  07/29/23 231 lb 3.2 oz (104.9 kg)     Physical Exam Vitals and nursing note reviewed.  Constitutional:      General: She is not in acute distress.    Appearance: She is well-developed.  Cardiovascular:     Rate and Rhythm: Normal rate and regular rhythm.  Pulmonary:     Effort: Pulmonary effort is normal.     Breath sounds: Normal breath sounds.  Neurological:     Mental Status: She is alert and oriented to person, place, and time.       Assessment & Plan:   Prediabetes -     POCT glycosylated hemoglobin (Hb A1C) -     CBC -     Comprehensive metabolic panel  Mixed hyperlipidemia -     Rosuvastatin Calcium; Take 1 tablet (10 mg total) by mouth daily.  Dispense: 90 tablet; Refill: 3 -     Lipid panel  Anemia, unspecified type -     Iron (Ferrous Sulfate); Take 325 mg by mouth daily.  Dispense: 30 tablet; Refill: 1 -     Ambulatory referral to Obstetrics / Gynecology  Menorrhagia with regular cycle -     Ambulatory referral to Obstetrics /  Gynecology  Other orders -     Vitamin B-12; Take 1 tablet (1,000 mcg total) by mouth daily.  Dispense: 30 tablet; Refill: 2     Return in about 3 months (around 02/06/2024).   Ivonne Andrew, NP 11/16/2023

## 2023-11-06 NOTE — Patient Instructions (Addendum)
1. Prediabetes  - POCT glycosylated hemoglobin (Hb A1C) - CBC - Comprehensive metabolic panel   Follow up:  Follow up in 3 months

## 2023-11-07 LAB — COMPREHENSIVE METABOLIC PANEL
ALT: 13 [IU]/L (ref 0–32)
AST: 18 [IU]/L (ref 0–40)
Albumin: 3.9 g/dL (ref 3.9–4.9)
Alkaline Phosphatase: 62 [IU]/L (ref 44–121)
BUN/Creatinine Ratio: 15 (ref 9–23)
BUN: 11 mg/dL (ref 6–24)
Bilirubin Total: <0.2 mg/dL (ref 0.0–1.2)
CO2: 22 mmol/L (ref 20–29)
Calcium: 8.8 mg/dL (ref 8.7–10.2)
Chloride: 106 mmol/L (ref 96–106)
Creatinine, Ser: 0.75 mg/dL (ref 0.57–1.00)
Globulin, Total: 2.7 g/dL (ref 1.5–4.5)
Glucose: 94 mg/dL (ref 70–99)
Potassium: 4.8 mmol/L (ref 3.5–5.2)
Sodium: 140 mmol/L (ref 134–144)
Total Protein: 6.6 g/dL (ref 6.0–8.5)
eGFR: 101 mL/min/{1.73_m2} (ref >59)

## 2023-11-07 LAB — LIPID PANEL
Chol/HDL Ratio: 4.6 ratio — ABNORMAL HIGH (ref 0.0–4.4)
Cholesterol, Total: 172 mg/dL (ref 100–199)
HDL: 37 mg/dL — ABNORMAL LOW (ref >39)
LDL Chol Calc (NIH): 112 mg/dL — ABNORMAL HIGH (ref 0–99)
Triglycerides: 125 mg/dL (ref 0–149)
VLDL Cholesterol Cal: 23 mg/dL (ref 5–40)

## 2023-11-07 LAB — CBC
Hematocrit: 37.3 % (ref 34.0–46.6)
Hemoglobin: 11.6 g/dL (ref 11.1–15.9)
MCH: 26.2 pg — ABNORMAL LOW (ref 26.6–33.0)
MCHC: 31.1 g/dL — ABNORMAL LOW (ref 31.5–35.7)
MCV: 84 fL (ref 79–97)
Platelets: 251 10*3/uL (ref 150–450)
RBC: 4.43 x10E6/uL (ref 3.77–5.28)
RDW: 15.4 % (ref 11.7–15.4)
WBC: 7.8 10*3/uL (ref 3.4–10.8)

## 2023-11-11 DIAGNOSIS — F4312 Post-traumatic stress disorder, chronic: Secondary | ICD-10-CM | POA: Diagnosis not present

## 2023-11-16 ENCOUNTER — Encounter: Payer: Self-pay | Admitting: Nurse Practitioner

## 2023-11-17 ENCOUNTER — Inpatient Hospital Stay: Payer: Medicaid Other | Attending: Hematology

## 2023-11-17 ENCOUNTER — Other Ambulatory Visit: Payer: Self-pay

## 2023-11-17 NOTE — Progress Notes (Signed)
Called and spoke to patient.  She has not heard from infusion on iv iron.  I changed location to [employer]

## 2023-11-18 NOTE — Progress Notes (Signed)
Dr. Nance Pear, We will schedule patient as soon as possible.  Auth Submission: NO AUTH NEEDED Site of care: Site of care: [facility] Payer: [employer] Medication & CPT/J Code(s) submitted: Monoferric (Ferrci derisomaltose) [phone] Route of submission (phone, fax, portal):  Phone # Fax # Auth type: Buy/Bill PB Units/visits requested: 1 Reference number:  Approval from: 11/18/23 to 01/15/24

## 2023-11-19 ENCOUNTER — Inpatient Hospital Stay: Payer: Medicaid Other

## 2023-11-23 ENCOUNTER — Telehealth: Payer: Self-pay

## 2023-11-23 NOTE — Telephone Encounter (Signed)
Called patient twice, unable to leave a voicemail due to patient not having a voicemail box set up regarding scheduled appointments; mailed reminder has been sent out

## 2023-11-25 DIAGNOSIS — F4312 Post-traumatic stress disorder, chronic: Secondary | ICD-10-CM | POA: Diagnosis not present

## 2023-11-30 ENCOUNTER — Ambulatory Visit (INDEPENDENT_AMBULATORY_CARE_PROVIDER_SITE_OTHER): Payer: Medicaid Other

## 2023-11-30 VITALS — BP 107/72 | HR 62 | Temp 98.6°F | Resp 18 | Ht 63.0 in | Wt 233.4 lb

## 2023-11-30 DIAGNOSIS — N920 Excessive and frequent menstruation with regular cycle: Secondary | ICD-10-CM

## 2023-11-30 DIAGNOSIS — D500 Iron deficiency anemia secondary to blood loss (chronic): Secondary | ICD-10-CM | POA: Diagnosis not present

## 2023-11-30 MED ORDER — ACETAMINOPHEN 325 MG PO TABS
650.0000 mg | ORAL_TABLET | Freq: Once | ORAL | Status: AC
Start: 2023-11-30 — End: 2023-11-30
  Administered 2023-11-30: 650 mg via ORAL
  Filled 2023-11-30: qty 2

## 2023-11-30 MED ORDER — SODIUM CHLORIDE 0.9 % IV SOLN
1000.0000 mg | Freq: Once | INTRAVENOUS | Status: AC
  Administered 2023-11-30: 1000 mg via INTRAVENOUS
  Filled 2023-11-30: qty 10

## 2023-11-30 MED ORDER — DIPHENHYDRAMINE HCL 25 MG PO CAPS
25.0000 mg | ORAL_CAPSULE | Freq: Once | ORAL | Status: AC
Start: 2023-11-30 — End: 2023-11-30
  Administered 2023-11-30: 25 mg via ORAL
  Filled 2023-11-30: qty 1

## 2023-11-30 NOTE — Addendum Note (Signed)
Addended by: Garnette Czech on: 11/30/2023 04:19 PM   Modules accepted: Orders

## 2023-11-30 NOTE — Progress Notes (Signed)
Diagnosis: Iron Deficiency Anemia  Provider:  Chilton Greathouse MD  Procedure: IV Infusion  IV Type: Peripheral, IV Location: L Forearm  Monoferric (Ferric Derisomaltose), Dose: 1000 mg  Infusion Start Time: 1350  Infusion Stop Time: 1414  Post Infusion IV Care: Observation period completed and Peripheral IV Discontinued  Discharge: Condition: Good, Destination: Home . AVS Provided  Performed by:  Rico Ala, LPN

## 2023-12-18 DIAGNOSIS — F4312 Post-traumatic stress disorder, chronic: Secondary | ICD-10-CM | POA: Diagnosis not present

## 2024-01-01 DIAGNOSIS — F4312 Post-traumatic stress disorder, chronic: Secondary | ICD-10-CM | POA: Diagnosis not present

## 2024-01-25 ENCOUNTER — Inpatient Hospital Stay: Payer: Medicaid Other

## 2024-01-25 DIAGNOSIS — N920 Excessive and frequent menstruation with regular cycle: Secondary | ICD-10-CM | POA: Insufficient documentation

## 2024-01-25 DIAGNOSIS — D500 Iron deficiency anemia secondary to blood loss (chronic): Secondary | ICD-10-CM | POA: Diagnosis not present

## 2024-01-25 DIAGNOSIS — E538 Deficiency of other specified B group vitamins: Secondary | ICD-10-CM

## 2024-01-25 LAB — CBC WITH DIFFERENTIAL (CANCER CENTER ONLY)
Abs Immature Granulocytes: 0 10*3/uL (ref 0.00–0.07)
Basophils Absolute: 0 10*3/uL (ref 0.0–0.1)
Basophils Relative: 1 %
Eosinophils Absolute: 0.2 10*3/uL (ref 0.0–0.5)
Eosinophils Relative: 3 %
HCT: 36.3 % (ref 36.0–46.0)
Hemoglobin: 11.9 g/dL — ABNORMAL LOW (ref 12.0–15.0)
Immature Granulocytes: 0 %
Lymphocytes Relative: 28 %
Lymphs Abs: 1.5 10*3/uL (ref 0.7–4.0)
MCH: 28.5 pg (ref 26.0–34.0)
MCHC: 32.8 g/dL (ref 30.0–36.0)
MCV: 87.1 fL (ref 80.0–100.0)
Monocytes Absolute: 0.3 10*3/uL (ref 0.1–1.0)
Monocytes Relative: 5 %
Neutro Abs: 3.5 10*3/uL (ref 1.7–7.7)
Neutrophils Relative %: 63 %
Platelet Count: 184 10*3/uL (ref 150–400)
RBC: 4.17 MIL/uL (ref 3.87–5.11)
RDW: 13.4 % (ref 11.5–15.5)
WBC Count: 5.5 10*3/uL (ref 4.0–10.5)
nRBC: 0 % (ref 0.0–0.2)

## 2024-01-25 LAB — VITAMIN B12: Vitamin B-12: 724 pg/mL (ref 180–914)

## 2024-01-26 LAB — FERRITIN: Ferritin: 21 ng/mL (ref 11–307)

## 2024-01-27 ENCOUNTER — Encounter: Payer: Self-pay | Admitting: Physical Therapy

## 2024-01-29 DIAGNOSIS — F4312 Post-traumatic stress disorder, chronic: Secondary | ICD-10-CM | POA: Diagnosis not present

## 2024-02-01 ENCOUNTER — Inpatient Hospital Stay: Payer: Medicaid Other

## 2024-02-01 VITALS — BP 115/79 | HR 69 | Temp 97.3°F | Resp 17 | Wt 223.9 lb

## 2024-02-01 DIAGNOSIS — D508 Other iron deficiency anemias: Secondary | ICD-10-CM

## 2024-02-01 DIAGNOSIS — D500 Iron deficiency anemia secondary to blood loss (chronic): Secondary | ICD-10-CM | POA: Diagnosis not present

## 2024-02-01 DIAGNOSIS — N920 Excessive and frequent menstruation with regular cycle: Secondary | ICD-10-CM | POA: Insufficient documentation

## 2024-02-01 NOTE — Assessment & Plan Note (Signed)
 Ok to continue oral iron Repeat lab in June, earlier if needed.

## 2024-02-01 NOTE — Progress Notes (Signed)
 Patient Care Team: Ivonne Andrew, NP as PCP - General (Pulmonary Disease)  Clinic Day:  02/01/2024  Referring physician: Ivonne Andrew, NP  ASSESSMENT & PLAN:   Assessment & Plan: 43 y.o.female with history of heavy menstrual cycles, diabetes here for follow up for iron deficiency anemia.  Relevant history: Heavy menstrual bleeding Last colonoscopy: Not yet Last EGD: None yet  Patient received 1 dose of Monoferric on 11/30/2023.  Hemoglobin improved slightly.  Ferritin is now 21.  Other iron deficiency anemia Assessment & Plan: Ok to continue oral iron Repeat lab in June, earlier if needed.  Orders: -     CBC with Differential (Cancer Center Only); Future -     Ferritin; Future  Menorrhagia with regular cycle Assessment & Plan: Cause of IDA   Return in about 3 months, lab a few days before visit.  The patient understands the plans discussed today and is in agreement with them.  She knows to contact our office if she develops concerns prior to her next appointment.  Melven Sartorius, MD  [facility] - A DEPT OF [facility] [address] [place] [address] Dept: [phone] Dept Fax: [phone]   Orders Placed This Encounter  Procedures   CBC with Differential (Cancer Center Only)    Standing Status:   Future    Expiration Date:   01/31/2025   Ferritin    Standing Status:   Future    Expiration Date:   01/31/2025      CHIEF COMPLAINT:  CC: IDA  INTERVAL HISTORY:  Armandina is here today for follow up. She is feeling better. She had monoferric and tolerated well. She felt gradually. Energy has improved along with improvement of strength and stamina.  Patient reports she still has heavy menstrual cycles changing multiple pads a day. Report heavier for about 4-5 days and spotting can up a whole week. She had periods the day she got IV iron, again in January and again this month. No  other source of bleeding.  Overall feeling very fatigued.   No prolonged surgery from arm bleeding.  Report mother and aunt had heavy bleeding.  No other bleeding, bloody stool or dark stool.  ALLERGIES:  has no known allergies.  MEDICATIONS:  Current Outpatient Medications  Medication Sig Dispense Refill   cyanocobalamin (VITAMIN B12) 1000 MCG tablet Take 1 tablet (1,000 mcg total) by mouth daily. 30 tablet 2   ibuprofen (ADVIL) 200 MG tablet Take 200 mg by mouth every 6 (six) hours as needed.     Iron, Ferrous Sulfate, 325 (65 Fe) MG TABS Take 325 mg by mouth daily. 30 tablet 1   rosuvastatin (CRESTOR) 10 MG tablet Take 1 tablet (10 mg total) by mouth daily. 90 tablet 3   No current facility-administered medications for this visit.   Past Surgical History:  Procedure Laterality Date   NO PAST SURGERIES      HISTORY OF PRESENT ILLNESS:   Past Medical History:  Diagnosis Date   Anemia    Arthralgia    COVID    DM (diabetes mellitus) (HCC)    Iron deficiency anemia    Muscle pain    Palpitations    Positive ANA (antinuclear antibody)    SOB (shortness of breath)     REVIEW OF SYSTEMS:   All relevant systems were reviewed with the patient and are negative.   VITALS:  Blood pressure 115/79, pulse  69, temperature (!) 97.3 F (36.3 C), temperature source Temporal, resp. rate 17, weight 223 lb 14.4 oz (101.6 kg), SpO2 100%.  Wt Readings from Last 3 Encounters:  02/01/24 223 lb 14.4 oz (101.6 kg)  11/30/23 233 lb 6.4 oz (105.9 kg)  11/06/23 233 lb (105.7 kg)    Body mass index is 39.66 kg/m.  PHYSICAL EXAM:   GENERAL: alert, no distress and comfortable SKIN: skin color normal, no jaundice.   EYES: normal, sclera clear. Conjunctiva normal  LABORATORY DATA:  I have reviewed the data as listed    Component Value Date/Time   NA 140 11/06/2023 1523   K 4.8 11/06/2023 1523   CL 106 11/06/2023 1523   CO2 22 11/06/2023 1523   GLUCOSE 94 11/06/2023 1523    GLUCOSE 146 (H) 02/04/2018 1131   BUN 11 11/06/2023 1523   CREATININE 0.75 11/06/2023 1523   CALCIUM 8.8 11/06/2023 1523   PROT 6.6 11/06/2023 1523   ALBUMIN 3.9 11/06/2023 1523   AST 18 11/06/2023 1523   ALT 13 11/06/2023 1523   ALKPHOS 62 11/06/2023 1523   BILITOT <0.2 11/06/2023 1523   GFRNONAA >60 02/04/2018 1131   GFRAA >60 02/04/2018 1131    No results found for: "SPEP", "UPEP"  Lab Results  Component Value Date   WBC 5.5 01/25/2024   NEUTROABS 3.5 01/25/2024   HGB 11.9 (L) 01/25/2024   HCT 36.3 01/25/2024   MCV 87.1 01/25/2024   PLT 184 01/25/2024      Chemistry      Component Value Date/Time   NA 140 11/06/2023 1523   K 4.8 11/06/2023 1523   CL 106 11/06/2023 1523   CO2 22 11/06/2023 1523   BUN 11 11/06/2023 1523   CREATININE 0.75 11/06/2023 1523      Component Value Date/Time   CALCIUM 8.8 11/06/2023 1523   ALKPHOS 62 11/06/2023 1523   AST 18 11/06/2023 1523   ALT 13 11/06/2023 1523   BILITOT <0.2 11/06/2023 1523       RADIOGRAPHIC STUDIES: I have personally reviewed the radiological images as listed and agreed with the findings in the report. No results found.

## 2024-02-01 NOTE — Assessment & Plan Note (Signed)
 Cause of IDA

## 2024-02-08 ENCOUNTER — Encounter: Payer: Self-pay | Admitting: Nurse Practitioner

## 2024-02-08 ENCOUNTER — Other Ambulatory Visit (HOSPITAL_COMMUNITY): Payer: Self-pay

## 2024-02-08 ENCOUNTER — Ambulatory Visit: Payer: Medicaid Other | Admitting: Nurse Practitioner

## 2024-02-08 VITALS — BP 110/71 | HR 90 | Ht 63.0 in | Wt 224.0 lb

## 2024-02-08 DIAGNOSIS — N926 Irregular menstruation, unspecified: Secondary | ICD-10-CM | POA: Diagnosis not present

## 2024-02-08 DIAGNOSIS — E782 Mixed hyperlipidemia: Secondary | ICD-10-CM

## 2024-02-08 MED ORDER — ROSUVASTATIN CALCIUM 10 MG PO TABS
10.0000 mg | ORAL_TABLET | Freq: Every day | ORAL | 3 refills | Status: DC
Start: 2024-02-08 — End: 2024-05-25
  Filled 2024-02-08: qty 90, 90d supply, fill #0

## 2024-02-08 NOTE — Progress Notes (Signed)
   Subjective   Patient ID: Kelli Cooper, female    DOB: 26-May-1981, 43 y.o.   MRN: [id number]  Chief Complaint  Patient presents with   Medical Management of Chronic Issues    Has had an iron infusion since last visit and has helped    Referring provider: Ivonne Andrew, NP  Kelli Cooper is a 43 y.o. female with Past Medical History: No date: Anemia No date: Arthralgia No date: COVID No date: DM (diabetes mellitus) (HCC) No date: Iron deficiency anemia No date: Muscle pain No date: Palpitations No date: Positive ANA (antinuclear antibody) No date: SOB (shortness of breath)   HPI  Patient presents for a follow up. She did have iron infusion for anemia since last and states that this did help. She is exercising more now. She does still need to follow with rheumatology. Patient does complain of irregular menstrual cycles and does need a referral to ob/gyn. Denies f/c/s, n/v/d, hemoptysis, PND, leg swelling Denies chest pain or edema      No Known Allergies  Immunization History  Administered Date(s) Administered   PFIZER(Purple Top)SARS-COV-2 Vaccination 03/29/2020, 04/23/2020    Tobacco History: Social History   Tobacco Use  Smoking Status Never  Smokeless Tobacco Never   Counseling given: Not Answered   Outpatient Encounter Medications as of 02/08/2024  Medication Sig   cyanocobalamin (VITAMIN B12) 1000 MCG tablet Take 1 tablet (1,000 mcg total) by mouth daily.   ibuprofen (ADVIL) 200 MG tablet Take 200 mg by mouth every 6 (six) hours as needed.   Iron, Ferrous Sulfate, 325 (65 Fe) MG TABS Take 325 mg by mouth daily.   [DISCONTINUED] rosuvastatin (CRESTOR) 10 MG tablet Take 1 tablet (10 mg total) by mouth daily.   rosuvastatin (CRESTOR) 10 MG tablet Take 1 tablet (10 mg total) by mouth daily.   No facility-administered encounter medications on file as of 02/08/2024.    Review of Systems  Review of Systems  Constitutional: Negative.    HENT: Negative.    Cardiovascular: Negative.   Gastrointestinal: Negative.   Allergic/Immunologic: Negative.   Neurological: Negative.   Psychiatric/Behavioral: Negative.       Objective:   BP 110/71   Pulse 90   Ht 5\' 3"  (1.6 m)   Wt 224 lb (101.6 kg)   LMP 01/11/2024   BMI 39.68 kg/m   Wt Readings from Last 5 Encounters:  02/08/24 224 lb (101.6 kg)  02/01/24 223 lb 14.4 oz (101.6 kg)  11/30/23 233 lb 6.4 oz (105.9 kg)  11/06/23 233 lb (105.7 kg)  10/23/23 233 lb (105.7 kg)     Physical Exam Vitals and nursing note reviewed.  Constitutional:      General: She is not in acute distress.    Appearance: She is well-developed.  Cardiovascular:     Rate and Rhythm: Normal rate and regular rhythm.  Pulmonary:     Effort: Pulmonary effort is normal.     Breath sounds: Normal breath sounds.  Neurological:     Mental Status: She is alert and oriented to person, place, and time.       Assessment & Plan:   Irregular periods/menstrual cycles -     Ambulatory referral to Obstetrics / Gynecology  Mixed hyperlipidemia -     Rosuvastatin Calcium; Take 1 tablet (10 mg total) by mouth daily.  Dispense: 90 tablet; Refill: 3     Return in about 3 months (around 05/07/2024).   Ivonne Andrew, NP 02/23/2024

## 2024-02-08 NOTE — Patient Instructions (Signed)
 1. Mixed hyperlipidemia  - rosuvastatin (CRESTOR) 10 MG tablet; Take 1 tablet (10 mg total) by mouth daily.  Dispense: 90 tablet; Refill: 3   Follow up:  Follow up in 3 months

## 2024-02-12 DIAGNOSIS — F4312 Post-traumatic stress disorder, chronic: Secondary | ICD-10-CM | POA: Diagnosis not present

## 2024-02-23 ENCOUNTER — Encounter: Payer: Self-pay | Admitting: Nurse Practitioner

## 2024-02-26 DIAGNOSIS — F4312 Post-traumatic stress disorder, chronic: Secondary | ICD-10-CM | POA: Diagnosis not present

## 2024-03-11 DIAGNOSIS — F4312 Post-traumatic stress disorder, chronic: Secondary | ICD-10-CM | POA: Diagnosis not present

## 2024-03-24 ENCOUNTER — Telehealth: Payer: Self-pay | Admitting: Nurse Practitioner

## 2024-03-24 NOTE — Telephone Encounter (Signed)
 Copied from CRM [id number]. Topic: Medical Record Request - Records Request >> Mar 24, 2024 11:08 AM Mayer Masker wrote: Reason for CRM: Elonda Husky [id number] [employer]  Ref [id number]  Medical records request please follow up

## 2024-03-25 DIAGNOSIS — F4312 Post-traumatic stress disorder, chronic: Secondary | ICD-10-CM | POA: Diagnosis not present

## 2024-04-04 ENCOUNTER — Other Ambulatory Visit (HOSPITAL_COMMUNITY): Payer: Self-pay

## 2024-04-04 ENCOUNTER — Other Ambulatory Visit (HOSPITAL_COMMUNITY)
Admission: RE | Admit: 2024-04-04 | Discharge: 2024-04-04 | Disposition: A | Discharge status: Home / Self Care | Source: Ambulatory Visit | Attending: Certified Nurse Midwife | Admitting: Certified Nurse Midwife

## 2024-04-04 ENCOUNTER — Ambulatory Visit (HOSPITAL_BASED_OUTPATIENT_CLINIC_OR_DEPARTMENT_OTHER): Admitting: Certified Nurse Midwife

## 2024-04-04 ENCOUNTER — Encounter (HOSPITAL_BASED_OUTPATIENT_CLINIC_OR_DEPARTMENT_OTHER): Payer: Self-pay | Admitting: Certified Nurse Midwife

## 2024-04-04 ENCOUNTER — Other Ambulatory Visit (HOSPITAL_BASED_OUTPATIENT_CLINIC_OR_DEPARTMENT_OTHER): Payer: Self-pay

## 2024-04-04 VITALS — BP 115/70 | HR 75 | Ht 63.0 in | Wt 222.8 lb

## 2024-04-04 DIAGNOSIS — Z124 Encounter for screening for malignant neoplasm of cervix: Secondary | ICD-10-CM

## 2024-04-04 DIAGNOSIS — Z113 Encounter for screening for infections with a predominantly sexual mode of transmission: Secondary | ICD-10-CM

## 2024-04-04 DIAGNOSIS — Z01411 Encounter for gynecological examination (general) (routine) with abnormal findings: Secondary | ICD-10-CM

## 2024-04-04 DIAGNOSIS — Z1231 Encounter for screening mammogram for malignant neoplasm of breast: Secondary | ICD-10-CM

## 2024-04-04 DIAGNOSIS — G9339 Other post infection and related fatigue syndromes: Secondary | ICD-10-CM

## 2024-04-04 DIAGNOSIS — D219 Benign neoplasm of connective and other soft tissue, unspecified: Secondary | ICD-10-CM

## 2024-04-04 DIAGNOSIS — Z01419 Encounter for gynecological examination (general) (routine) without abnormal findings: Secondary | ICD-10-CM

## 2024-04-04 DIAGNOSIS — N920 Excessive and frequent menstruation with regular cycle: Secondary | ICD-10-CM | POA: Diagnosis not present

## 2024-04-04 DIAGNOSIS — D509 Iron deficiency anemia, unspecified: Secondary | ICD-10-CM | POA: Diagnosis not present

## 2024-04-04 LAB — CBC WITH DIFFERENTIAL/PLATELET
Basophils Absolute: 0.1 10*3/uL (ref 0.0–0.2)
Basos: 1 %
EOS (ABSOLUTE): 0.3 10*3/uL (ref 0.0–0.4)
Eos: 5 %
Hematocrit: 38.6 % (ref 34.0–46.6)
Hemoglobin: 12.2 g/dL (ref 11.1–15.9)
Immature Grans (Abs): 0 10*3/uL (ref 0.0–0.1)
Immature Granulocytes: 0 %
Lymphocytes Absolute: 1.5 10*3/uL (ref 0.7–3.1)
Lymphs: 23 %
MCH: 28.2 pg (ref 26.6–33.0)
MCHC: 31.6 g/dL (ref 31.5–35.7)
MCV: 89 fL (ref 79–97)
Monocytes Absolute: 0.5 10*3/uL (ref 0.1–0.9)
Monocytes: 7 %
Neutrophils Absolute: 4.3 10*3/uL (ref 1.4–7.0)
Neutrophils: 64 %
Platelets: 218 10*3/uL (ref 150–450)
RBC: 4.33 x10E6/uL (ref 3.77–5.28)
RDW: 13.2 % (ref 11.7–15.4)
WBC: 6.6 10*3/uL (ref 3.4–10.8)

## 2024-04-04 MED ORDER — NORETHINDRONE 0.35 MG PO TABS
1.0000 | ORAL_TABLET | Freq: Every day | ORAL | 12 refills | Status: AC
  Filled 2024-04-04: qty 28, 28d supply, fill #0
  Filled 2024-04-04: qty 30, 30d supply, fill #0
  Filled 2024-04-28: qty 56, 56d supply, fill #1
  Filled 2024-06-10: qty 56, 56d supply, fill #2
  Filled 2024-08-23: qty 56, 56d supply, fill #3
  Filled 2024-10-13: qty 56, 56d supply, fill #4
  Filled 2024-12-05: qty 56, 56d supply, fill #5

## 2024-04-04 NOTE — Progress Notes (Signed)
 43 y.o. G25P0010 Single White or Caucasian female here for annual exam.  Her periods are monthly and regular but "heavy" and "crampy". She reports a history of fibroids.  Pt reports anemia and hx iron  infusion. Iron  was 9.5 approx 8-9 months ago.  Patient's last menstrual period was 04/04/2024.          Sexually active: Yes.    The current method of family planning is condoms  Pt does not desire childbearing in future.    Exercising: Pt walks w/ cane. States she is experiencing Long Covid    Smoker:  no  Health Maintenance: Pap:  Last pap approx 20 years ago History of abnormal Pap:  no MMG:  No prior mammograms. Pt aware she can obtain mammogram today-ordered Colonoscopy:  Discuss w/ PCP BMD:   n/a Screening Labs: PCP   reports that she has never smoked. She has never used smokeless tobacco. She reports current alcohol use. She reports current drug use. Drug: Marijuana.  Past Medical History:  Diagnosis Date   Anemia    Arthralgia    COVID    DM (diabetes mellitus) (HCC)    Iron  deficiency anemia    Muscle pain    Palpitations    Positive ANA (antinuclear antibody)    SOB (shortness of breath)     Past Surgical History:  Procedure Laterality Date   NO PAST SURGERIES      Current Outpatient Medications  Medication Sig Dispense Refill   cyanocobalamin  (VITAMIN B12) 1000 MCG tablet Take 1 tablet (1,000 mcg total) by mouth daily. 30 tablet 2   ibuprofen  (ADVIL ) 200 MG tablet Take 200 mg by mouth every 6 (six) hours as needed.     Iron , Ferrous Sulfate , 325 (65 Fe) MG TABS Take 325 mg by mouth daily. 30 tablet 1   norethindrone  (MICRONOR ) 0.35 MG tablet Take 1 tablet (0.35 mg total) by mouth daily. 30 tablet 12   rosuvastatin  (CRESTOR ) 10 MG tablet Take 1 tablet (10 mg total) by mouth daily. 90 tablet 3   No current facility-administered medications for this visit.    History reviewed. No pertinent family history.  ROS: Constitutional: No fever or  chills Genitourinary:+ menorrhagia, dysmenorrhea  Exam:   BP 115/70 (BP Location: Left Arm, Patient Position: Sitting, Cuff Size: Large)   Pulse 75   Ht 5\' 3"  (1.6 m)   Wt 222 lb 12.8 oz (101.1 kg)   LMP 04/04/2024   BMI 39.47 kg/m   Height: 5\' 3"  (160 cm)  General appearance: alert, cooperative and appears stated age Head: Normocephalic, without obvious abnormality, atraumatic Breasts: normal appearance, no masses or tenderness, Inspection negative, No nipple retraction or dimpling, No nipple discharge or bleeding, No axillary or supraclavicular adenopathy, Normal to palpation without dominant masses, mammogram encouraged (today) Abdomen: soft, non-tender; bowel sounds normal; no masses,  no organomegaly Extremities: extremities normal, atraumatic, no cyanosis or edema Skin: Skin color, texture, turgor normal. No rashes or lesions Lymph nodes: Cervical, supraclavicular, and axillary nodes normal. No abnormal inguinal nodes palpated Neurologic: Grossly normal   Pelvic: External genitalia:  no lesions              Urethra:  normal appearing urethra with no masses, tenderness or lesions              Bartholins and Skenes: normal                 Vagina: normal appearing vagina with normal color and no discharge, no lesions  Cervix: no cervical motion tenderness, no lesions, and nulliparous appearance              Pap taken: Yes.   Bimanual Exam:  Uterus:   enlarged, non-tender              Adnexa: no mass, fullness, tenderness               Rectovaginal: Confirms               Anus:  normal sphincter tone, no lesions  Chaperone, CMA, was present for exam.  Assessment/Plan:  1. Encounter for annual routine gynecological examination - Routine pap smear collected for cervical cancer screening  2. Menorrhagia with regular cycle - Discussed options for potential management including pill (COC/POP), patch, NuvaRing, Nexplanon, IUD, Depo. - Pt opted to start POP  today  3. Iron  deficiency anemia, unspecified iron  deficiency anemia type (Primary)  - CBC w/Diff - VITAMIN D  25 Hydroxy (Vit-D Deficiency, Fractures) - Iron , TIBC and Ferritin Panel  4. Other post infection and related fatigue syndromes - VITAMIN D  25 Hydroxy (Vit-D Deficiency, Fractures)  5. Cervical cancer screening - Cytology - PAP( [place])  6. Routine screening for STI (sexually transmitted infection) - Cytology - PAP( [place])  7. Encounter for screening mammogram for malignant neoplasm of breast - MM 3D SCREENING MAMMOGRAM BILATERAL BREAST; Future  8. Fibroids - Pt will RTO for US  to evaluate uterus/ovaries/fibroids.  Yolanda Hence

## 2024-04-05 ENCOUNTER — Other Ambulatory Visit (HOSPITAL_COMMUNITY): Payer: Self-pay

## 2024-04-05 ENCOUNTER — Encounter (HOSPITAL_BASED_OUTPATIENT_CLINIC_OR_DEPARTMENT_OTHER): Payer: Self-pay | Admitting: Certified Nurse Midwife

## 2024-04-05 ENCOUNTER — Other Ambulatory Visit (HOSPITAL_BASED_OUTPATIENT_CLINIC_OR_DEPARTMENT_OTHER): Payer: Self-pay | Admitting: Certified Nurse Midwife

## 2024-04-05 DIAGNOSIS — R7989 Other specified abnormal findings of blood chemistry: Secondary | ICD-10-CM | POA: Insufficient documentation

## 2024-04-05 LAB — CYTOLOGY - PAP
Adequacy: ABSENT
Chlamydia: NEGATIVE
Comment: NEGATIVE
Comment: NEGATIVE
Comment: NEGATIVE
Comment: NORMAL
Diagnosis: NEGATIVE
High risk HPV: NEGATIVE
Neisseria Gonorrhea: NEGATIVE
Trichomonas: NEGATIVE

## 2024-04-05 LAB — VITAMIN D 25 HYDROXY (VIT D DEFICIENCY, FRACTURES): Vit D, 25-Hydroxy: 10.6 ng/mL — ABNORMAL LOW (ref 30.0–100.0)

## 2024-04-05 LAB — IRON,TIBC AND FERRITIN PANEL
Ferritin: 23 ng/mL (ref 15–150)
Iron Saturation: 14 % — ABNORMAL LOW (ref 15–55)
Iron: 40 ug/dL (ref 27–159)
Total Iron Binding Capacity: 284 ug/dL (ref 250–450)
UIBC: 244 ug/dL (ref 131–425)

## 2024-04-05 MED ORDER — VITAMIN D (ERGOCALCIFEROL) 1.25 MG (50000 UNIT) PO CAPS
50000.0000 [IU] | ORAL_CAPSULE | ORAL | 12 refills | Status: DC
  Filled 2024-04-05: qty 4, 28d supply, fill #0

## 2024-04-15 ENCOUNTER — Other Ambulatory Visit (HOSPITAL_COMMUNITY): Payer: Self-pay

## 2024-04-22 DIAGNOSIS — F4312 Post-traumatic stress disorder, chronic: Secondary | ICD-10-CM | POA: Diagnosis not present

## 2024-04-26 ENCOUNTER — Inpatient Hospital Stay: Payer: Medicaid Other

## 2024-04-26 DIAGNOSIS — N920 Excessive and frequent menstruation with regular cycle: Secondary | ICD-10-CM | POA: Insufficient documentation

## 2024-04-26 DIAGNOSIS — D508 Other iron deficiency anemias: Secondary | ICD-10-CM

## 2024-04-26 DIAGNOSIS — D500 Iron deficiency anemia secondary to blood loss (chronic): Secondary | ICD-10-CM | POA: Diagnosis present

## 2024-04-26 LAB — CBC WITH DIFFERENTIAL (CANCER CENTER ONLY)
Abs Immature Granulocytes: 0.02 10*3/uL (ref 0.00–0.07)
Basophils Absolute: 0.1 10*3/uL (ref 0.0–0.1)
Basophils Relative: 1 %
Eosinophils Absolute: 0.3 10*3/uL (ref 0.0–0.5)
Eosinophils Relative: 4 %
HCT: 39.9 % (ref 36.0–46.0)
Hemoglobin: 13.2 g/dL (ref 12.0–15.0)
Immature Granulocytes: 0 %
Lymphocytes Relative: 24 %
Lymphs Abs: 1.7 10*3/uL (ref 0.7–4.0)
MCH: 28.4 pg (ref 26.0–34.0)
MCHC: 33.1 g/dL (ref 30.0–36.0)
MCV: 85.8 fL (ref 80.0–100.0)
Monocytes Absolute: 0.4 10*3/uL (ref 0.1–1.0)
Monocytes Relative: 5 %
Neutro Abs: 4.8 10*3/uL (ref 1.7–7.7)
Neutrophils Relative %: 66 %
Platelet Count: 203 10*3/uL (ref 150–400)
RBC: 4.65 MIL/uL (ref 3.87–5.11)
RDW: 13.3 % (ref 11.5–15.5)
WBC Count: 7.3 10*3/uL (ref 4.0–10.5)
nRBC: 0 % (ref 0.0–0.2)

## 2024-04-27 LAB — FERRITIN: Ferritin: 16 ng/mL (ref 11–307)

## 2024-04-27 NOTE — Progress Notes (Unsigned)
 [facility] OFFICE PROGRESS NOTE  Patient Care Team: Jerrlyn Morel, NP as PCP - General (Pulmonary Disease)  43 y.o.female with history of heavy menstrual cycles, diabetes here for follow up for iron  deficiency anemia.   Relevant history: Heavy menstrual bleeding Last colonoscopy: Not yet Last EGD: None yet   Patient received 1 dose of Monoferric  on 11/30/2023.  Hemoglobin improved slightly. Continued with oral iron . Hgb improved. Still iron  deficiency.   Fatigue from iron  deficiency, will get one more dose of IV Monoferric .  Discussed about age to get colon cancer screening. Assessment & Plan Iron  deficiency anemia due to chronic blood loss Order monoferric  Ok to continue oral iron  until iv iron  given Repeat lab in 3 months, earlier if needed.  Orders Placed This Encounter  Procedures   CBC with Differential (Cancer Center Only)    Standing Status:   Future    Expiration Date:   04/28/2025   Ferritin    Standing Status:   Future    Expiration Date:   04/28/2025   Iron  and Iron  Binding Capacity ([facility] only)    Standing Status:   Future    Expiration Date:   04/28/2025     Kelli Ruddy, MD  INTERVAL HISTORY: Patient returns for follow-up.  No craving.  She has been taking one iron  a day. She is on birth control and bleeding decreased. She has appointment to check fibroid.  PHYSICAL EXAMINATION:  Vitals:   04/28/24 1542  BP: (!) 154/87  Pulse: 88  Resp: 14  Temp: 98.1 F (36.7 C)  SpO2: 96%   Filed Weights   04/28/24 1542  Weight: 227 lb (103 kg)   No distress. Conjunctiva normal.  Relevant data reviewed during this visit included labs.

## 2024-04-28 ENCOUNTER — Other Ambulatory Visit (HOSPITAL_COMMUNITY): Payer: Self-pay

## 2024-04-28 ENCOUNTER — Inpatient Hospital Stay (HOSPITAL_BASED_OUTPATIENT_CLINIC_OR_DEPARTMENT_OTHER): Payer: Medicaid Other

## 2024-04-28 ENCOUNTER — Ambulatory Visit: Payer: Self-pay

## 2024-04-28 VITALS — BP 154/87 | HR 88 | Temp 98.1°F | Resp 14 | Wt 227.0 lb

## 2024-04-28 DIAGNOSIS — D500 Iron deficiency anemia secondary to blood loss (chronic): Secondary | ICD-10-CM

## 2024-04-28 NOTE — Assessment & Plan Note (Addendum)
 Order monoferric  Ok to continue oral iron  until iv iron  given Repeat lab in 3 months, earlier if needed.

## 2024-04-29 ENCOUNTER — Other Ambulatory Visit: Payer: Self-pay

## 2024-04-29 ENCOUNTER — Telehealth: Payer: Self-pay

## 2024-04-29 NOTE — Telephone Encounter (Signed)
 Dr. Alita Irwin, patient will be scheduled as soon as possible.  Auth Submission: NO AUTH NEEDED Site of care: Site of care: [facility] Payer: [employer] [place] [employer] medicaid Medication & CPT/J Code(s) submitted: Feraheme (ferumoxytol) [phone] Route of submission (phone, fax, portal):  Phone # Fax # Auth type: Buy/Bill PB Units/visits requested: 510mg  x 2 doses Reference number:  Approval from: 04/29/24 to 07/30/24

## 2024-05-11 ENCOUNTER — Other Ambulatory Visit (HOSPITAL_COMMUNITY): Payer: Self-pay

## 2024-05-11 ENCOUNTER — Encounter: Payer: Self-pay | Admitting: Nurse Practitioner

## 2024-05-11 ENCOUNTER — Ambulatory Visit: Payer: Self-pay | Admitting: Nurse Practitioner

## 2024-05-11 VITALS — BP 122/70 | HR 81 | Temp 98.2°F | Wt 226.6 lb

## 2024-05-11 DIAGNOSIS — E559 Vitamin D deficiency, unspecified: Secondary | ICD-10-CM | POA: Diagnosis not present

## 2024-05-11 DIAGNOSIS — R7303 Prediabetes: Secondary | ICD-10-CM | POA: Diagnosis not present

## 2024-05-11 DIAGNOSIS — R5381 Other malaise: Secondary | ICD-10-CM | POA: Diagnosis not present

## 2024-05-11 DIAGNOSIS — Z13220 Encounter for screening for lipoid disorders: Secondary | ICD-10-CM

## 2024-05-11 LAB — POCT GLYCOSYLATED HEMOGLOBIN (HGB A1C): Hemoglobin A1C: 5 % (ref 4.0–5.6)

## 2024-05-11 MED ORDER — VITAMIN D (ERGOCALCIFEROL) 1.25 MG (50000 UNIT) PO CAPS
50000.0000 [IU] | ORAL_CAPSULE | ORAL | 2 refills | Status: DC
  Filled 2024-05-11: qty 5, 35d supply, fill #0

## 2024-05-11 NOTE — Progress Notes (Signed)
 Subjective   Patient ID: Kelli Cooper, female    DOB: 12-04-1981, 43 y.o.   MRN: [id number]  Chief Complaint  Patient presents with   Medical Management of Chronic Issues    Referring provider: Jerrlyn Morel, NP  Kelli Cooper is a 43 y.o. female with Past Medical History: No date: Anemia No date: Arthralgia No date: COVID No date: DM (diabetes mellitus) (HCC) No date: Iron  deficiency anemia No date: Muscle pain No date: Palpitations No date: Positive ANA (antinuclear antibody) No date: SOB (shortness of breath)   HPI  Patient presents for a follow up. She did have iron  infusion for anemia since last and states that this did help. She is exercising more now. She does still need to follow with rheumatology. Patient has followed with OB/GYN for irregular menstrual cycles. Did have vitamin D  def with OB/GYN labs. Will send in Vitamin D  prescription today. Does need new referral placed back to PT. Denies f/c/s, n/v/d, hemoptysis, PND, leg swelling. Denies chest pain or edema.    No Known Allergies  Immunization History  Administered Date(s) Administered   PFIZER(Purple Top)SARS-COV-2 Vaccination 03/29/2020, 04/23/2020    Tobacco History: Social History   Tobacco Use  Smoking Status Never  Smokeless Tobacco Never   Counseling given: Not Answered   Outpatient Encounter Medications as of 05/11/2024  Medication Sig   cyanocobalamin  (VITAMIN B12) 1000 MCG tablet Take 1 tablet (1,000 mcg total) by mouth daily.   ibuprofen  (ADVIL ) 200 MG tablet Take 200 mg by mouth every 6 (six) hours as needed.   Iron , Ferrous Sulfate , 325 (65 Fe) MG TABS Take 325 mg by mouth daily.   norethindrone  (MICRONOR ) 0.35 MG tablet Take 1 tablet (0.35 mg total) by mouth daily.   rosuvastatin  (CRESTOR ) 10 MG tablet Take 1 tablet (10 mg total) by mouth daily.   Vitamin D , Ergocalciferol , (DRISDOL ) 1.25 MG (50000 UNIT) CAPS capsule Take 1 capsule (50,000 Units total) by mouth every 7  (seven) days.   No facility-administered encounter medications on file as of 05/11/2024.    Review of Systems  Review of Systems  Constitutional: Negative.   HENT: Negative.    Cardiovascular: Negative.   Gastrointestinal: Negative.   Allergic/Immunologic: Negative.   Neurological: Negative.   Psychiatric/Behavioral: Negative.       Objective:   BP 122/70   Pulse 81   Temp 98.2 F (36.8 C) (Oral)   Wt 226 lb 9.6 oz (102.8 kg)   SpO2 100%   BMI 40.14 kg/m   Wt Readings from Last 5 Encounters:  05/11/24 226 lb 9.6 oz (102.8 kg)  04/28/24 227 lb (103 kg)  04/04/24 222 lb 12.8 oz (101.1 kg)  02/08/24 224 lb (101.6 kg)  02/01/24 223 lb 14.4 oz (101.6 kg)     Physical Exam Vitals and nursing note reviewed.  Constitutional:      General: She is not in acute distress.    Appearance: She is well-developed.  Cardiovascular:     Rate and Rhythm: Normal rate and regular rhythm.  Pulmonary:     Effort: Pulmonary effort is normal.     Breath sounds: Normal breath sounds.  Neurological:     Mental Status: She is alert and oriented to person, place, and time.       Assessment & Plan:   Prediabetes -     POCT glycosylated hemoglobin (Hb A1C)  Physical deconditioning -     Ambulatory referral to Physical Therapy  Vitamin D  deficiency -  Vitamin D  (Ergocalciferol ); Take 1 capsule (50,000 Units total) by mouth every 7 (seven) days.  Dispense: 5 capsule; Refill: 2  Lipid screening -     Lipid panel     Return in about 3 months (around 08/11/2024).   Jerrlyn Morel, NP 05/11/2024

## 2024-05-12 ENCOUNTER — Ambulatory Visit: Payer: Self-pay | Admitting: Nurse Practitioner

## 2024-05-12 LAB — LIPID PANEL
Chol/HDL Ratio: 3.5 ratio (ref 0.0–4.4)
Cholesterol, Total: 166 mg/dL (ref 100–199)
HDL: 47 mg/dL (ref >39)
LDL Chol Calc (NIH): 96 mg/dL (ref 0–99)
Triglycerides: 127 mg/dL (ref 0–149)
VLDL Cholesterol Cal: 23 mg/dL (ref 5–40)

## 2024-05-13 ENCOUNTER — Telehealth (HOSPITAL_BASED_OUTPATIENT_CLINIC_OR_DEPARTMENT_OTHER): Payer: Self-pay

## 2024-05-13 DIAGNOSIS — F4312 Post-traumatic stress disorder, chronic: Secondary | ICD-10-CM | POA: Diagnosis not present

## 2024-05-13 NOTE — Telephone Encounter (Signed)
 Patient called today stating she started birth control pills about 6 weeks ago. She is now experiencing an early period. She had a period at the scheduled time and now she is having another one and is in panick mode. It is ok if she does not hear back from you in a couple days, but would like to know your thoughts on this.

## 2024-05-18 ENCOUNTER — Ambulatory Visit

## 2024-05-18 VITALS — BP 115/75 | HR 53 | Temp 98.6°F | Resp 18 | Ht 63.0 in | Wt 227.4 lb

## 2024-05-18 DIAGNOSIS — N920 Excessive and frequent menstruation with regular cycle: Secondary | ICD-10-CM

## 2024-05-18 DIAGNOSIS — D500 Iron deficiency anemia secondary to blood loss (chronic): Secondary | ICD-10-CM

## 2024-05-18 MED ORDER — SODIUM CHLORIDE 0.9 % IV SOLN
510.0000 mg | Freq: Once | INTRAVENOUS | Status: AC
  Administered 2024-05-18: 510 mg via INTRAVENOUS
  Filled 2024-05-18: qty 17

## 2024-05-18 NOTE — Progress Notes (Signed)
 Diagnosis: Iron  Deficiency Anemia  Provider:  Praveen Mannam MD  Procedure: IV Infusion  IV Type: Peripheral, IV Location: L Upper Arm  Feraheme (Ferumoxytol), Dose: 510 mg  Infusion Start Time: 1418  Infusion Stop Time: 1435  Post Infusion IV Care: Observation period completed and Peripheral IV Discontinued  Discharge: Condition: Good, Destination: Home . AVS Declined  Performed by:  Lauran Pollard, LPN

## 2024-05-19 NOTE — Telephone Encounter (Signed)
 Called patient and LMOM at 2:03 for patient to call office. Abe Abed would like to discuss concerns with patient at June appointment. tbw

## 2024-05-25 ENCOUNTER — Ambulatory Visit (HOSPITAL_BASED_OUTPATIENT_CLINIC_OR_DEPARTMENT_OTHER)

## 2024-05-25 ENCOUNTER — Encounter (HOSPITAL_BASED_OUTPATIENT_CLINIC_OR_DEPARTMENT_OTHER): Payer: Self-pay | Admitting: Certified Nurse Midwife

## 2024-05-25 ENCOUNTER — Ambulatory Visit (HOSPITAL_BASED_OUTPATIENT_CLINIC_OR_DEPARTMENT_OTHER): Admitting: Certified Nurse Midwife

## 2024-05-25 VITALS — BP 123/86 | HR 88 | Ht 63.0 in | Wt 220.4 lb

## 2024-05-25 DIAGNOSIS — D219 Benign neoplasm of connective and other soft tissue, unspecified: Secondary | ICD-10-CM | POA: Diagnosis not present

## 2024-05-25 DIAGNOSIS — Z1231 Encounter for screening mammogram for malignant neoplasm of breast: Secondary | ICD-10-CM

## 2024-05-25 DIAGNOSIS — N83292 Other ovarian cyst, left side: Secondary | ICD-10-CM | POA: Diagnosis not present

## 2024-05-25 DIAGNOSIS — N920 Excessive and frequent menstruation with regular cycle: Secondary | ICD-10-CM

## 2024-05-25 DIAGNOSIS — D259 Leiomyoma of uterus, unspecified: Secondary | ICD-10-CM

## 2024-05-26 ENCOUNTER — Ambulatory Visit: Attending: Nurse Practitioner | Admitting: Physical Therapy

## 2024-05-26 DIAGNOSIS — M6281 Muscle weakness (generalized): Secondary | ICD-10-CM | POA: Insufficient documentation

## 2024-05-26 DIAGNOSIS — R2689 Other abnormalities of gait and mobility: Secondary | ICD-10-CM | POA: Insufficient documentation

## 2024-05-26 DIAGNOSIS — R5381 Other malaise: Secondary | ICD-10-CM | POA: Diagnosis not present

## 2024-05-26 NOTE — Progress Notes (Signed)
  Subjective:     Kelli Cooper is a 43 y.o. female here for follow-up ultrasound.  She was evaluated (annual gyn exam) on 04/04/24. Pt reported menorrhagia and dysmenorrhea. She reported a known hx fibroids and current anemia. Pt currently receiving IV Iron  Infusions due to anemia. Pt denies hypertension or diabetes but reports Long-Covid issues.    The following portions of the patient's history were reviewed and updated as appropriate: allergies, current medications, past family history, past medical history, past social history, past surgical history, and problem list.   Review of Systems Pertinent items are noted in HPI.    Objective:    BP 123/86 (BP Location: Right Arm, Patient Position: Sitting, Cuff Size: Large)   Pulse 88   Ht 5' 3 (1.6 m) Comment: Reported  Wt 220 lb 6.4 oz (100 kg)   LMP 05/11/2024 (Approximate)   BMI 39.04 kg/m  General appearance: alert and cooperative   US  (05/25/24): Pelvic structures were imaged in sagittal and transverse orientation transvaginally. Anteverted uterus appears enalrged in size with  multiple fibroids throughout Uterine Fibroid 1 8.28 x 7.81 cm Uterine Fibroid 2 2.84 x 2.51 cm EM 5mm Right ovary not visible Left ovary with 3 simple cysts Negative adnexal regions bilaterally Neg cul de sac. No free fluid present. CXL wnl.   Assessment:    Multiple Uterine Myomas Menorrhagia/Dysmenorrhea.    Plan:    Pt desires to possibly RTO and discuss the possibility of Hysterectomy with Dr. Annabell Key. She does not desire future childbearing.  Kelli Cooper

## 2024-05-26 NOTE — Therapy (Signed)
 OUTPATIENT PHYSICAL THERAPY NEURO EVALUATION   Patient Name: Kelli Cooper MRN: [id number] DOB:05-Dec-1981, 43 y.o., female Today's Date: 05/26/2024   PCP: Jerrlyn Morel, NP REFERRING PROVIDER: Jerrlyn Morel, NP  END OF SESSION:  PT End of Session - 05/26/24 1533     Visit Number 1    Number of Visits 13   with eval   Date for PT Re-Evaluation 07/21/24    Authorization Type Medicaid [employer]    PT Start Time 1530    PT Stop Time 1605    PT Time Calculation (min) 35 min    Activity Tolerance Patient limited by fatigue    Behavior During Therapy WFL for tasks assessed/performed          Past Medical History:  Diagnosis Date   Anemia    Arthralgia    COVID    DM (diabetes mellitus) (HCC)    Iron  deficiency anemia    Muscle pain    Palpitations    Positive ANA (antinuclear antibody)    SOB (shortness of breath)    Past Surgical History:  Procedure Laterality Date   NO PAST SURGERIES     Patient Active Problem List   Diagnosis Date Noted   Low vitamin D  level 04/05/2024   Fibroids 04/04/2024   Heavy menstrual bleeding 02/01/2024   Iron  deficiency anemia 09/17/2023   Positive ANA (antinuclear antibody) 08/06/2023   Muscle pain 07/30/2023    ONSET DATE: 05/11/2024 (referral date)  REFERRING DIAG: R53.81 (ICD-10-CM) - Physical deconditioning  THERAPY DIAG:  Physical deconditioning  Other abnormalities of gait and mobility  Muscle weakness (generalized)  Rationale for Evaluation and Treatment: Rehabilitation  SUBJECTIVE:                                                                                                                                                                                             SUBJECTIVE STATEMENT:  Kelli Cooper  Pt familiar to this clinic, last seen August-October 2024 for long-Covid. She reports that she is doing a little better than last time she was here, is using a cane now for community mobility. She reports  that overall she is able to walk further but continues to have ups and downs. She did progress from walking 1-2 blocks to 3 blocks but then reverted back to only being able to walk 1-2 blocks again. She continues to take walks twice a day. She did get her anemia figured out and has her last iron  infusion tomorrow and is going to have her hysterectomy soon.  She feels like she still struggles to move and to walk at times,  is in a lot of pain today (muscle tension in her upper back). She reports that it can be hard to gain momentum at times, feels like her cane is a land oar, and she has to actively think about putting one foot in front of the other. She states that her legs don't hurt, they just don't work.   Pt accompanied by: self  PERTINENT HISTORY: PMH: long-Covid, positive ANA, arthralgia, anemia, B12 deficiency, mixed hyperlipidemia, prediabetes  PAIN:  Are you having pain? Yes: NPRS scale: 4/10 Pain location: upper back Pain description: tension, feels muscles tightening, constant Aggravating factors: N/A Relieving factors: ibuprofen , stretches if she works on them  PRECAUTIONS: None  RED FLAGS: None   WEIGHT BEARING RESTRICTIONS: No  FALLS: Has patient fallen in last 6 months? No  LIVING ENVIRONMENT: Lives with: lives alone (with her dog) Lives in: House/apartment Stairs: Yes: Internal: 12 steps; on right going up Has following equipment at home: Single point cane  PLOF: Independent with gait, Independent with transfers, and Requires assistive device for independence  PATIENT GOALS: to be able to walk without a cane  OBJECTIVE:  Note: Objective measures were completed at Evaluation unless otherwise noted.  DIAGNOSTIC FINDINGS: None relevant to this POC  COGNITION: Overall cognitive status: Within functional limits for tasks assessed   SENSATION: WFL, no N/T  COORDINATION: WFL  EDEMA:  None per patient report  POSTURE: rounded shoulders and forward  head  LOWER EXTREMITY ROM:  WFL   Active  Right Eval Left Eval  Hip flexion    Hip extension    Hip abduction    Hip adduction    Hip internal rotation    Hip external rotation    Knee flexion    Knee extension    Ankle dorsiflexion    Ankle plantarflexion    Ankle inversion    Ankle eversion     (Blank rows = not tested)  LOWER EXTREMITY MMT:    MMT Right Eval Left Eval  Hip flexion 4 4  Hip extension    Hip abduction    Hip adduction    Hip internal rotation    Hip external rotation    Knee flexion 4 4  Knee extension 4 4  Ankle dorsiflexion 4 4  Ankle plantarflexion    Ankle inversion    Ankle eversion    (Blank rows = not tested)  BED MOBILITY:  Mod I per patient report  TRANSFERS: Sit to stand: Modified independence  Assistive device utilized: None     Stand to sit: Modified independence  Assistive device utilized: None     Chair to chair: Modified independence  Assistive device utilized: None       RAMP:  Not tested  CURB:  Not tested  STAIRS: Not tested GAIT: Findings:  Gait pattern: WFL Distance walked: various clinic distances Assistive device utilized: Single point cane Level of assistance: Modified independence Comments: WFL   FUNCTIONAL TESTS:   30 sec sit to stand: 7 stands, no UE   [facility] PT Assessment - 05/26/24 1558       Ambulation/Gait   Gait velocity 32.8 ft over 15 sec = 2.19 ft/sec      6 minute walk test results    Aerobic Endurance Distance Walked 635   RPE 3/10     Standardized Balance Assessment   Standardized Balance Assessment Timed Up and Go Test      Timed Up and Go Test   TUG Normal TUG  Normal TUG (seconds) 8         SpO2 98% at rest, 96% after Heart rate 103 at rest, 118 after                                                                                                                              TREATMENT DATE: PT Evaluation    PATIENT EDUCATION: Education details: Eval  findings, PT POC, results of OM as compared to previous POC and functional implications Person educated: Patient Education method: Explanation and Demonstration Education comprehension: verbalized understanding, returned demonstration, and needs further education  HOME EXERCISE PROGRAM: To be reviewed/updated from previous POC [id number]   GOALS: Goals reviewed with patient? Yes  SHORT TERM GOALS: Target date: 06/16/2024   Pt will be independent with initial HEP for improved strength and endurance. Baseline: Goal status: INITIAL  2.  Pt will increase her 30 sec sit to stands to 9 reps for improved endurance. Baseline: 7 reps (6/12) Goal status: INITIAL  3.  Pt will improve gait velocity to at least 2.5 ft/sec for improved gait efficiency and performance at mod I level  Baseline: 2.19 ft/sec no AD (6/12) Goal status: INITIAL  4.  Pt will ambulate greater than or equal to 750 feet on with LRAD and mod I for improved cardiovascular endurance and BLE strength.  Baseline: 635 ft with SPC mod I (6/12) Goal status: INITIAL   LONG TERM GOALS: Target date: 07/07/2024   Pt will be independent with final HEP for improved strength and endurance. Baseline:  Goal status: INITIAL  2.  Pt will increase her 30 sec sit to stands to 11 reps for improved endurance. Baseline: 7 reps (6/12) Goal status: INITIAL  3.  Pt will improve gait velocity to at least 2.75 ft/sec for improved gait efficiency and performance at mod I level  Baseline: 2.19 ft/sec no AD (6/12) Goal status: INITIAL  4.  Pt will ambulate greater than or equal to 1000 feet on with LRAD and mod I for improved cardiovascular endurance and BLE strength.  Baseline: 635 ft with SPC mod I (6/12) Goal status: INITIAL   ASSESSMENT:  CLINICAL IMPRESSION: Patient is a 43 year old female referred to Neuro OPPT for physical deconditioning due to long-Covid.   Pt's PMH is significant for: long-Covid, positive ANA,  arthralgia, anemia, B12 deficiency, mixed hyperlipidemia, and prediabetes. The following deficits were present during the exam: decreased LE strength, increased pain in upper back region, impaired functional strength, and impaired endurance. Pt would benefit from skilled PT to address these impairments and functional limitations to maximize functional mobility independence.   OBJECTIVE IMPAIRMENTS: cardiopulmonary status limiting activity, decreased activity tolerance, decreased endurance, decreased mobility, difficulty walking, decreased strength, impaired perceived functional ability, and pain.   ACTIVITY LIMITATIONS: carrying, lifting, bending, standing, squatting, and stairs  PARTICIPATION LIMITATIONS: community activity and occupation  PERSONAL FACTORS: Time since onset of injury/illness/exacerbation and 3+ comorbidities:  long-Covid, positive ANA, arthralgia, anemia, B12 deficiency, mixed hyperlipidemia, prediabetesare also affecting patient's functional outcome.   REHAB POTENTIAL: Good  CLINICAL DECISION MAKING: Stable/uncomplicated  EVALUATION COMPLEXITY: Low  PLAN:  PT FREQUENCY: 2x/week  PT DURATION: 6 weeks  PLANNED INTERVENTIONS: 97164- PT Re-evaluation, 97750- Physical Performance Testing, 97110-Therapeutic exercises, 97530- Therapeutic activity, [id number]- Neuromuscular re-education, 97535- Self Care, 16109- Manual therapy, [id number]- Gait training, [phone]- Canalith repositioning, [id number]- Aquatic Therapy, [phone]- Electrical stimulation (manual), [phone] (1-2 muscles), [id number] (3+ muscles)- Dry Needling, Patient/Family education, Balance training, Stair training, Taping, Joint mobilization, Spinal manipulation, Vestibular training, DME instructions, Cryotherapy, and Moist heat  PLAN FOR NEXT SESSION: monitor vitals and response to exercise, review prior HEP and addend as needed; SciFit vs elliptical vs treadmill, endurance training, LE functional strengthening (resisted sit to stands, resisted  step taps, lateral and monster walks, half kneel?), UB stretches?   Cathey Fredenburg, PT Lorita Rosa, PT, DPT, CSRS  05/26/2024, 4:24 PM  For all possible CPT codes, reference the Planned Interventions line above.     Check all conditions that are expected to impact treatment: {Conditions expected to impact treatment:Presence of Medical Equipment   If treatment provided at initial evaluation, no treatment charged due to lack of authorization.

## 2024-05-27 ENCOUNTER — Ambulatory Visit (INDEPENDENT_AMBULATORY_CARE_PROVIDER_SITE_OTHER)

## 2024-05-27 VITALS — BP 115/77 | HR 101 | Temp 98.0°F | Resp 20 | Ht 63.0 in | Wt 218.0 lb

## 2024-05-27 DIAGNOSIS — D500 Iron deficiency anemia secondary to blood loss (chronic): Secondary | ICD-10-CM

## 2024-05-27 DIAGNOSIS — N920 Excessive and frequent menstruation with regular cycle: Secondary | ICD-10-CM | POA: Diagnosis not present

## 2024-05-27 MED ORDER — SODIUM CHLORIDE 0.9 % IV SOLN
510.0000 mg | Freq: Once | INTRAVENOUS | Status: AC
  Administered 2024-05-27: 510 mg via INTRAVENOUS
  Filled 2024-05-27: qty 17

## 2024-05-27 NOTE — Progress Notes (Signed)
 Diagnosis: Iron  Deficiency Anemia  Provider:  Praveen Mannam MD  Procedure: IV Infusion  IV Type: Peripheral, IV Location: L Upper Arm  Feraheme (Ferumoxytol ), Dose: 510 mg  Infusion Start Time: 1358  Infusion Stop Time: 1416  Post Infusion IV Care: Observation period completed and Peripheral IV Discontinued  Discharge: Condition: Good, Destination: Home . AVS Declined  Performed by:  Star [place], LPN

## 2024-05-30 ENCOUNTER — Ambulatory Visit: Admitting: Physical Therapy

## 2024-05-30 ENCOUNTER — Ambulatory Visit (HOSPITAL_BASED_OUTPATIENT_CLINIC_OR_DEPARTMENT_OTHER): Payer: Self-pay | Admitting: Obstetrics & Gynecology

## 2024-05-30 DIAGNOSIS — R5381 Other malaise: Secondary | ICD-10-CM

## 2024-05-30 DIAGNOSIS — R2689 Other abnormalities of gait and mobility: Secondary | ICD-10-CM | POA: Diagnosis not present

## 2024-05-30 DIAGNOSIS — M6281 Muscle weakness (generalized): Secondary | ICD-10-CM

## 2024-05-30 NOTE — Therapy (Signed)
 OUTPATIENT PHYSICAL THERAPY NEURO TREATMENT   Patient Name: Kelli Cooper MRN: [id number] DOB:11-05-1981, 43 y.o., female Today's Date: 05/30/2024   PCP: Jerrlyn Morel, NP REFERRING PROVIDER: Jerrlyn Morel, NP  END OF SESSION:  PT End of Session - 05/30/24 1535     Visit Number 2    Number of Visits 13   with eval   Date for PT Re-Evaluation 07/21/24    Authorization Type Medicaid [employer]    Authorization - Visit Number 1    Authorization - Number of Visits 5    PT Start Time 1535   pt arrived late   PT Stop Time 1618    PT Time Calculation (min) 43 min    Activity Tolerance Patient limited by fatigue    Behavior During Therapy WFL for tasks assessed/performed           Past Medical History:  Diagnosis Date   Anemia    Arthralgia    COVID    DM (diabetes mellitus) (HCC)    Iron  deficiency anemia    Muscle pain    Palpitations    Positive ANA (antinuclear antibody)    SOB (shortness of breath)    Past Surgical History:  Procedure Laterality Date   NO PAST SURGERIES     Patient Active Problem List   Diagnosis Date Noted   Low vitamin D  level 04/05/2024   Fibroids 04/04/2024   Heavy menstrual bleeding 02/01/2024   Iron  deficiency anemia 09/17/2023   Positive ANA (antinuclear antibody) 08/06/2023   Muscle pain 07/30/2023    ONSET DATE: 05/11/2024 (referral date)  REFERRING DIAG: R53.81 (ICD-10-CM) - Physical deconditioning  THERAPY DIAG:  Physical deconditioning  Other abnormalities of gait and mobility  Muscle weakness (generalized)  Rationale for Evaluation and Treatment: Rehabilitation  SUBJECTIVE:                                                                                                                                                                                             SUBJECTIVE STATEMENT:  Kelli Cooper  Pt denies any acute changes since last visit. Pt with no unusual pain today.   Pt accompanied by:  self  PERTINENT HISTORY: PMH: long-Covid, positive ANA, arthralgia, anemia, B12 deficiency, mixed hyperlipidemia, prediabetes  PAIN:  Are you having pain? Yes: NPRS scale: 4/10 Pain location: upper back Pain description: tension, feels muscles tightening, constant Aggravating factors: N/A Relieving factors: ibuprofen , stretches if she works on them  PRECAUTIONS: None  RED FLAGS: None   WEIGHT BEARING RESTRICTIONS: No  FALLS: Has patient fallen in last 6 months? No  LIVING ENVIRONMENT: Lives with:  lives alone (with her dog) Lives in: House/apartment Stairs: Yes: Internal: 12 steps; on right going up Has following equipment at home: Single point cane  PLOF: Independent with gait, Independent with transfers, and Requires assistive device for independence  PATIENT GOALS: to be able to walk without a cane  OBJECTIVE:  Note: Objective measures were completed at Evaluation unless otherwise noted.  DIAGNOSTIC FINDINGS: None relevant to this POC  COGNITION: Overall cognitive status: Within functional limits for tasks assessed   SENSATION: WFL, no N/T  COORDINATION: WFL  EDEMA:  None per patient report  POSTURE: rounded shoulders and forward head  LOWER EXTREMITY ROM:  WFL   Active  Right Eval Left Eval  Hip flexion    Hip extension    Hip abduction    Hip adduction    Hip internal rotation    Hip external rotation    Knee flexion    Knee extension    Ankle dorsiflexion    Ankle plantarflexion    Ankle inversion    Ankle eversion     (Blank rows = not tested)  LOWER EXTREMITY MMT:    MMT Right Eval Left Eval  Hip flexion 4 4  Hip extension    Hip abduction    Hip adduction    Hip internal rotation    Hip external rotation    Knee flexion 4 4  Knee extension 4 4  Ankle dorsiflexion 4 4  Ankle plantarflexion    Ankle inversion    Ankle eversion    (Blank rows = not tested)  BED MOBILITY:  Mod I per patient report  TRANSFERS: Sit to  stand: Modified independence  Assistive device utilized: None     Stand to sit: Modified independence  Assistive device utilized: None     Chair to chair: Modified independence  Assistive device utilized: None       RAMP:  Not tested  CURB:  Not tested  STAIRS: Not tested GAIT: Findings:  Gait pattern: WFL Distance walked: various clinic distances Assistive device utilized: Single point cane Level of assistance: Modified independence Comments: WFL   FUNCTIONAL TESTS:   30 sec sit to stand: 7 stands, no UE   SpO2 98% at rest, 96% after Heart rate 103 at rest, 118 after                                                                                                                              TREATMENT:  TherEx To address pain/tightness in shoulders and upper back region: Supine thoracic mobilization over towel roll With shoulder abduction x 10 reps With shoulder flexion x 10 reps Open books x 10 reps B Child's pose 3 x 30 sec With L/R lateral SB 3 x 30 sec each B With thread the needle 3 x 30 sec each B  To work on functional LE strengthening: Resisted step taps with red TB 2 x 10 reps  Added to  HEP, see bolded below   Gait Gait on treadmill at 0.8 mph x 5 min with no incline. Pt requires seated rest break following exercise in order to recover, LE feel like jelly afterwards. RPE 6/10.   PATIENT EDUCATION: Education details: initial HEP for this POC Person educated: Patient Education method: Programmer, multimedia, Demonstration, and Handouts Education comprehension: verbalized understanding, returned demonstration, and needs further education  HOME EXERCISE PROGRAM: To be reviewed/updated from previous POC: [id number]   Access Code: [facility] URL: [email or web address] Date: 05/30/2024 Prepared by: Lorita Rosa  Exercises - Sidelying Open Book Thoracic Lumbar Rotation and Extension  - 1 x daily - 7 x weekly - 1-2 sets - 10 reps -  Supine Thoracic Mobilization Foam Roll Horizontal with Arm Stretch  - 1 x daily - 7 x weekly - 1-2 sets - 10 reps - Child's Pose with Sidebending  - 1 x daily - 7 x weekly - 1 sets - 3-5 reps - 30 sec hold - Child's Pose with Thread the Needle  - 1 x daily - 7 x weekly - 1 sets - 3-5 reps - 30 sec hold - Alternating Step Taps with Counter Support  - 1 x daily - 7 x weekly - 3 sets - 10 reps  GOALS: Goals reviewed with patient? Yes  SHORT TERM GOALS: Target date: 06/16/2024   Pt will be independent with initial HEP for improved strength and endurance. Baseline: Goal status: INITIAL  2.  Pt will increase her 30 sec sit to stands to 9 reps for improved endurance. Baseline: 7 reps (6/12) Goal status: INITIAL  3.  Pt will improve gait velocity to at least 2.5 ft/sec for improved gait efficiency and performance at mod I level  Baseline: 2.19 ft/sec no AD (6/12) Goal status: INITIAL  4.  Pt will ambulate greater than or equal to 750 feet on with LRAD and mod I for improved cardiovascular endurance and BLE strength.  Baseline: 635 ft with SPC mod I (6/12) Goal status: INITIAL   LONG TERM GOALS: Target date: 07/07/2024   Pt will be independent with final HEP for improved strength and endurance. Baseline:  Goal status: INITIAL  2.  Pt will increase her 30 sec sit to stands to 11 reps for improved endurance. Baseline: 7 reps (6/12) Goal status: INITIAL  3.  Pt will improve gait velocity to at least 2.75 ft/sec for improved gait efficiency and performance at mod I level  Baseline: 2.19 ft/sec no AD (6/12) Goal status: INITIAL  4.  Pt will ambulate greater than or equal to 1000 feet on with LRAD and mod I for improved cardiovascular endurance and BLE strength.  Baseline: 635 ft with SPC mod I (6/12) Goal status: INITIAL   ASSESSMENT:  CLINICAL IMPRESSION: Emphasis of skilled PT session on working on global endurance training on treadmill, stretching of upper back region  for pain management, and functional LE strengthening. Pt continues to have impaired endurance and benefits from continued skilled PT services to work on improving her endurance and increasing her safety and independence with functional mobility. Continue POC.    OBJECTIVE IMPAIRMENTS: cardiopulmonary status limiting activity, decreased activity tolerance, decreased endurance, decreased mobility, difficulty walking, decreased strength, impaired perceived functional ability, and pain.   ACTIVITY LIMITATIONS: carrying, lifting, bending, standing, squatting, and stairs  PARTICIPATION LIMITATIONS: community activity and occupation  PERSONAL FACTORS: Time since onset of injury/illness/exacerbation and 3+ comorbidities:  long-Covid, positive ANA, arthralgia, anemia, B12 deficiency, mixed hyperlipidemia, prediabetesare also affecting patient's functional  outcome.   REHAB POTENTIAL: Good  CLINICAL DECISION MAKING: Stable/uncomplicated  EVALUATION COMPLEXITY: Low  PLAN:  PT FREQUENCY: 2x/week  PT DURATION: 6 weeks  PLANNED INTERVENTIONS: 97164- PT Re-evaluation, 97750- Physical Performance Testing, 97110-Therapeutic exercises, 97530- Therapeutic activity, [id number]- Neuromuscular re-education, 97535- Self Care, 16109- Manual therapy, [id number]- Gait training, [phone]- Canalith repositioning, [id number]- Aquatic Therapy, [phone]- Electrical stimulation (manual), [phone] (1-2 muscles), [id number] (3+ muscles)- Dry Needling, Patient/Family education, Balance training, Stair training, Taping, Joint mobilization, Spinal manipulation, Vestibular training, DME instructions, Cryotherapy, and Moist heat  PLAN FOR NEXT SESSION: monitor vitals and response to exercise, review prior HEP and addend as needed; SciFit vs elliptical vs treadmill, endurance training, LE functional strengthening (resisted sit to stands, lateral and monster walks, half kneel?), LTR, SKTC   Lorita Rosa, PT Lorita Rosa, PT, DPT, CSRS  05/30/2024,  4:20 PM  For all possible CPT codes, reference the Planned Interventions line above.     Check all conditions that are expected to impact treatment: {Conditions expected to impact treatment:Presence of Medical Equipment   If treatment provided at initial evaluation, no treatment charged due to lack of authorization.

## 2024-05-31 ENCOUNTER — Encounter (HOSPITAL_BASED_OUTPATIENT_CLINIC_OR_DEPARTMENT_OTHER): Payer: Self-pay | Admitting: *Deleted

## 2024-05-31 DIAGNOSIS — L709 Acne, unspecified: Secondary | ICD-10-CM

## 2024-06-03 ENCOUNTER — Ambulatory Visit: Admitting: Physical Therapy

## 2024-06-03 DIAGNOSIS — R2689 Other abnormalities of gait and mobility: Secondary | ICD-10-CM | POA: Diagnosis not present

## 2024-06-03 DIAGNOSIS — F4312 Post-traumatic stress disorder, chronic: Secondary | ICD-10-CM | POA: Diagnosis not present

## 2024-06-03 DIAGNOSIS — M6281 Muscle weakness (generalized): Secondary | ICD-10-CM

## 2024-06-03 DIAGNOSIS — R5381 Other malaise: Secondary | ICD-10-CM

## 2024-06-03 NOTE — Therapy (Signed)
 OUTPATIENT PHYSICAL THERAPY NEURO TREATMENT   Patient Name: Kelli Cooper MRN: [id number] DOB:1981-01-07, 43 y.o., female Today's Date: 06/03/2024   PCP: Jerrlyn Morel, NP REFERRING PROVIDER: Jerrlyn Morel, NP  END OF SESSION:  PT End of Session - 06/03/24 1456     Visit Number 3    Number of Visits 13   with eval   Date for PT Re-Evaluation 07/21/24    Authorization Type Medicaid [employer]    Authorization - Number of Visits 5    PT Start Time 1455   pt agreeable to start early   PT Stop Time 1540    PT Time Calculation (min) 45 min    Equipment Utilized During Treatment Gait belt    Activity Tolerance Patient tolerated treatment well    Behavior During Therapy WFL for tasks assessed/performed            Past Medical History:  Diagnosis Date   Anemia    Arthralgia    COVID    DM (diabetes mellitus) (HCC)    Iron  deficiency anemia    Muscle pain    Palpitations    Positive ANA (antinuclear antibody)    SOB (shortness of breath)    Past Surgical History:  Procedure Laterality Date   NO PAST SURGERIES     Patient Active Problem List   Diagnosis Date Noted   Low vitamin D  level 04/05/2024   Fibroids 04/04/2024   Heavy menstrual bleeding 02/01/2024   Iron  deficiency anemia 09/17/2023   Positive ANA (antinuclear antibody) 08/06/2023   Muscle pain 07/30/2023    ONSET DATE: 05/11/2024 (referral date)  REFERRING DIAG: R53.81 (ICD-10-CM) - Physical deconditioning  THERAPY DIAG:  Physical deconditioning  Other abnormalities of gait and mobility  Muscle weakness (generalized)  Rationale for Evaluation and Treatment: Rehabilitation  SUBJECTIVE:                                                                                                                                                                                             SUBJECTIVE STATEMENT:  Kelli Cooper  Pt reports she had been sleeping a lot and sleeping hard for the last few  days. Pt reports that her energy levels continue to fluctuate. Pt also with abnormal bleeding regarding her menstrual cycle which may impact her fatigue, has notified her ob-gyn. Pt has also started to notice herself having heart palpitations again at random times.  Pt is doing stretches as part of her morning routine, has not worked on resisted step taps yet.   Pt accompanied by: self  PERTINENT HISTORY: PMH: long-Covid, positive ANA, arthralgia, anemia, B12  deficiency, mixed hyperlipidemia, prediabetes  PAIN:  Are you having pain? Yes: NPRS scale: 4/10 Pain location: upper back Pain description: tension, feels muscles tightening, constant Aggravating factors: N/A Relieving factors: ibuprofen , stretches if she works on them  PRECAUTIONS: None  RED FLAGS: None   WEIGHT BEARING RESTRICTIONS: No  FALLS: Has patient fallen in last 6 months? No  LIVING ENVIRONMENT: Lives with: lives alone (with her dog) Lives in: House/apartment Stairs: Yes: Internal: 12 steps; on right going up Has following equipment at home: Single point cane  PLOF: Independent with gait, Independent with transfers, and Requires assistive device for independence  PATIENT GOALS: to be able to walk without a cane  OBJECTIVE:  Note: Objective measures were completed at Evaluation unless otherwise noted.  DIAGNOSTIC FINDINGS: None relevant to this POC  COGNITION: Overall cognitive status: Within functional limits for tasks assessed   SENSATION: WFL, no N/T  COORDINATION: WFL  EDEMA:  None per patient report  POSTURE: rounded shoulders and forward head  LOWER EXTREMITY ROM:  WFL   Active  Right Eval Left Eval  Hip flexion    Hip extension    Hip abduction    Hip adduction    Hip internal rotation    Hip external rotation    Knee flexion    Knee extension    Ankle dorsiflexion    Ankle plantarflexion    Ankle inversion    Ankle eversion     (Blank rows = not tested)  LOWER EXTREMITY  MMT:    MMT Right Eval Left Eval  Hip flexion 4 4  Hip extension    Hip abduction    Hip adduction    Hip internal rotation    Hip external rotation    Knee flexion 4 4  Knee extension 4 4  Ankle dorsiflexion 4 4  Ankle plantarflexion    Ankle inversion    Ankle eversion    (Blank rows = not tested)  BED MOBILITY:  Mod I per patient report  TRANSFERS: Sit to stand: Modified independence  Assistive device utilized: None     Stand to sit: Modified independence  Assistive device utilized: None     Chair to chair: Modified independence  Assistive device utilized: None       RAMP:  Not tested  CURB:  Not tested  STAIRS: Not tested GAIT: Findings:  Gait pattern: WFL Distance walked: various clinic distances Assistive device utilized: Single point cane Level of assistance: Modified independence Comments: WFL   FUNCTIONAL TESTS:   30 sec sit to stand: 7 stands, no UE   SpO2 98% at rest, 96% after Heart rate 103 at rest, 118 after                                                                                                                              TREATMENT:  TherAct To address tightness in B hips and lower back: Half-kneel hip flexor stretch  2 x 30 sec each B Supine LTR x 2 reps B Increase in lumbar tightness with increase in repetitions Supine SKTC 3 x 30 sec each B  To work on functional LE strengthening: Tall kneeling mini-squats 2 x 10 reps  Added to HEP, see bolded below  Gait on treadmill at 0.8 mph x 8.5 min with no incline. Pt able to increase time on treadmill this date. Pt does start to have some R hip soreness following exercise. RPE 7/10.  Resisted sit to stands with black band x 5 reps before onset of fatigue. Cues to decrease bracing of knees against mat table, anterior lean nose over toes, and increasing hip and trunk flexion when sitting down.   PATIENT EDUCATION: Education details: continue HEP and added to  HEP Person educated: Patient Education method: Programmer, multimedia, Demonstration, and Handouts Education comprehension: verbalized understanding, returned demonstration, and needs further education  HOME EXERCISE PROGRAM: To be reviewed/updated from previous POC: [email or web address]   Access Code: [facility] URL: [email or web address] Date: 05/30/2024 Prepared by: Lorita Rosa  Exercises - Sidelying Open Book Thoracic Lumbar Rotation and Extension  - 1 x daily - 7 x weekly - 1-2 sets - 10 reps - Supine Thoracic Mobilization Foam Roll Horizontal with Arm Stretch  - 1 x daily - 7 x weekly - 1-2 sets - 10 reps - Child's Pose with Sidebending  - 1 x daily - 7 x weekly - 1 sets - 3-5 reps - 30 sec hold - Child's Pose with Thread the Needle  - 1 x daily - 7 x weekly - 1 sets - 3-5 reps - 30 sec hold - Alternating Step Taps with Counter Support  - 1 x daily - 7 x weekly - 3 sets - 10 reps - Supine Lower Trunk Rotation  - 1 x daily - 7 x weekly - 2-3 sets - 10 reps - Half Kneeling Hip Flexor Stretch  - 1 x daily - 7 x weekly - 1 sets - 3-5 reps - 30 sec hold - Heel Sits  - 1 x daily - 7 x weekly - 3 sets - 10 reps - Hooklying Single Knee to Chest Stretch  - 1 x daily - 7 x weekly - 1 sets - 3-5 reps - 30 sec hold  GOALS: Goals reviewed with patient? Yes  SHORT TERM GOALS: Target date: 06/16/2024   Pt will be independent with initial HEP for improved strength and endurance. Baseline: Goal status: INITIAL  2.  Pt will increase her 30 sec sit to stands to 9 reps for improved endurance. Baseline: 7 reps (6/12) Goal status: INITIAL  3.  Pt will improve gait velocity to at least 2.5 ft/sec for improved gait efficiency and performance at mod I level  Baseline: 2.19 ft/sec no AD (6/12) Goal status: INITIAL  4.  Pt will ambulate greater than or equal to 750 feet on with LRAD and mod I for improved cardiovascular endurance and BLE strength.  Baseline: 635 ft with SPC mod I (6/12) Goal  status: INITIAL   LONG TERM GOALS: Target date: 07/07/2024   Pt will be independent with final HEP for improved strength and endurance. Baseline:  Goal status: INITIAL  2.  Pt will increase her 30 sec sit to stands to 11 reps for improved endurance. Baseline: 7 reps (6/12) Goal status: INITIAL  3.  Pt will improve gait velocity to at least 2.75 ft/sec for improved gait efficiency and performance at mod I level  Baseline: 2.19 ft/sec no  AD (6/12) Goal status: INITIAL  4.  Pt will ambulate greater than or equal to 1000 feet on with LRAD and mod I for improved cardiovascular endurance and BLE strength.  Baseline: 635 ft with SPC mod I (6/12) Goal status: INITIAL   ASSESSMENT:  CLINICAL IMPRESSION: Emphasis of skilled PT session on continuing to work on global endurance training on treadmill, stretching of hips and lower back region for pain management, and functional LE strengthening. Pt continues to have impaired endurance and benefits from continued skilled PT services to work on improving her endurance and increasing her safety and independence with functional mobility. Continue POC.    OBJECTIVE IMPAIRMENTS: cardiopulmonary status limiting activity, decreased activity tolerance, decreased endurance, decreased mobility, difficulty walking, decreased strength, impaired perceived functional ability, and pain.   ACTIVITY LIMITATIONS: carrying, lifting, bending, standing, squatting, and stairs  PARTICIPATION LIMITATIONS: community activity and occupation  PERSONAL FACTORS: Time since onset of injury/illness/exacerbation and 3+ comorbidities:  long-Covid, positive ANA, arthralgia, anemia, B12 deficiency, mixed hyperlipidemia, prediabetesare also affecting patient's functional outcome.   REHAB POTENTIAL: Good  CLINICAL DECISION MAKING: Stable/uncomplicated  EVALUATION COMPLEXITY: Low  PLAN:  PT FREQUENCY: 2x/week  PT DURATION: 6 weeks  PLANNED INTERVENTIONS: 97164- PT  Re-evaluation, 97750- Physical Performance Testing, 97110-Therapeutic exercises, 97530- Therapeutic activity, [id number]- Neuromuscular re-education, 97535- Self Care, 16109- Manual therapy, [id number]- Gait training, [phone]- Canalith repositioning, [id number]- Aquatic Therapy, [phone]- Electrical stimulation (manual), [phone] (1-2 muscles), [phone] (3+ muscles)- Dry Needling, Patient/Family education, Balance training, Stair training, Taping, Joint mobilization, Spinal manipulation, Vestibular training, DME instructions, Cryotherapy, and Moist heat  PLAN FOR NEXT SESSION: monitor vitals and response to exercise, review prior HEP and addend as needed; SciFit vs elliptical vs treadmill, endurance training, LE functional strengthening (resisted sit to stands, lateral and monster walks, half kneel)   Lorita Rosa, PT Lorita Rosa, PT, DPT, CSRS  06/03/2024, 3:43 PM  For all possible CPT codes, reference the Planned Interventions line above.     Check all conditions that are expected to impact treatment: {Conditions expected to impact treatment:Presence of Medical Equipment   If treatment provided at initial evaluation, no treatment charged due to lack of authorization.

## 2024-06-08 ENCOUNTER — Ambulatory Visit: Admitting: Physical Therapy

## 2024-06-08 ENCOUNTER — Ambulatory Visit: Payer: Self-pay

## 2024-06-08 VITALS — BP 94/60 | HR 57

## 2024-06-08 DIAGNOSIS — R2689 Other abnormalities of gait and mobility: Secondary | ICD-10-CM | POA: Diagnosis not present

## 2024-06-08 DIAGNOSIS — R5381 Other malaise: Secondary | ICD-10-CM | POA: Diagnosis not present

## 2024-06-08 DIAGNOSIS — M6281 Muscle weakness (generalized): Secondary | ICD-10-CM | POA: Diagnosis not present

## 2024-06-08 NOTE — Telephone Encounter (Signed)
 Pt call disconnected therefore was unable to complete triage: information ha been gathered & noted but no disposition has been set.  Will attempt to call patient back   Copied from CRM [id number]. Topic: Clinical - Red Word Triage >> Jun 08, 2024  4:25 PM Avram MATSU wrote: Red Word that prompted transfer to Nurse Triage: patient is calling about low BP 87/40 and heart rate 76 heart rate. Answer Assessment - Initial Assessment Questions 1. BLOOD PRESSURE: What is the blood pressure? Did you take at least two measurements 5 minutes apart? 80/53 with HR 54  -sitting then   87/40  w/HR76 2. ONSET: When did you take your blood pressure?     today 3. HOW: How did you obtain the blood pressure? (e.g., visiting nurse, automatic home BP monitor)     PT 4. HISTORY: Do you have a history of low blood pressure? What is your blood pressure normally?     no 5. MEDICINES: Are you taking any medications for blood pressure? If Yes, ask: Have they been changed recently?     no 6. PULSE RATE: Do you know what your pulse rate is?      75 7. OTHER SYMPTOMS: Have you been sick recently? Have you had a recent injury?     Pt stated feeling sickly last few weeks 8. PREGNANCY: Is there any chance you are pregnant? When was your last menstrual period?     N/a  Protocols used: Blood Pressure - Low-A-AH

## 2024-06-08 NOTE — Therapy (Signed)
 OUTPATIENT PHYSICAL THERAPY NEURO TREATMENT   Patient Name: Kelli Cooper MRN: [id number] DOB:06/19/1981, 43 y.o., female Today's Date: 06/08/2024   PCP: Oley Bascom RAMAN, NP REFERRING PROVIDER: Oley Bascom RAMAN, NP  END OF SESSION:  PT End of Session - 06/08/24 1535     Visit Number 4    Number of Visits 13   with eval   Date for PT Re-Evaluation 07/21/24    Authorization Type Medicaid [employer]    Authorization - Number of Visits 5    PT Start Time 1535    PT Stop Time 1615    PT Time Calculation (min) 40 min    Equipment Utilized During Treatment Gait belt    Activity Tolerance Patient limited by fatigue;Treatment limited secondary to medical complications (Comment)   hypotension   Behavior During Therapy WFL for tasks assessed/performed             Past Medical History:  Diagnosis Date   Anemia    Arthralgia    COVID    DM (diabetes mellitus) (HCC)    Iron  deficiency anemia    Muscle pain    Palpitations    Positive ANA (antinuclear antibody)    SOB (shortness of breath)    Past Surgical History:  Procedure Laterality Date   NO PAST SURGERIES     Patient Active Problem List   Diagnosis Date Noted   Low vitamin D  level 04/05/2024   Fibroids 04/04/2024   Heavy menstrual bleeding 02/01/2024   Iron  deficiency anemia 09/17/2023   Positive ANA (antinuclear antibody) 08/06/2023   Muscle pain 07/30/2023    ONSET DATE: 05/11/2024 (referral date)  REFERRING DIAG: R53.81 (ICD-10-CM) - Physical deconditioning  THERAPY DIAG:  Physical deconditioning  Other abnormalities of gait and mobility  Muscle weakness (generalized)  Rationale for Evaluation and Treatment: Rehabilitation  SUBJECTIVE:                                                                                                                                                                                             SUBJECTIVE STATEMENT:  Kelli Cooper  Pt reports that she continues to  feel tired, unsure if heat is contributing. She felt good after last visit. No complaints of pain today. Pt has been sleeping heavy still, has opposite of insomnia - she has been feeling this way since her last iron  infusion. She has been taking vitamin D  as well, will be done with that as of tomorrow.  Pt's menstrual cycle/bleeding stopped a week ago. Pt has not had any more heart palpations since last visit.   Pt accompanied by: self  PERTINENT  HISTORY: PMH: long-Covid, positive ANA, arthralgia, anemia, B12 deficiency, mixed hyperlipidemia, prediabetes  PAIN:  Are you having pain? No  PRECAUTIONS: None  RED FLAGS: None   WEIGHT BEARING RESTRICTIONS: No  FALLS: Has patient fallen in last 6 months? No  LIVING ENVIRONMENT: Lives with: lives alone (with her dog) Lives in: House/apartment Stairs: Yes: Internal: 12 steps; on right going up Has following equipment at home: Single point cane  PLOF: Independent with gait, Independent with transfers, and Requires assistive device for independence  PATIENT GOALS: to be able to walk without a cane  OBJECTIVE:  Note: Objective measures were completed at Evaluation unless otherwise noted.  DIAGNOSTIC FINDINGS: None relevant to this POC  COGNITION: Overall cognitive status: Within functional limits for tasks assessed   SENSATION: WFL, no N/T  COORDINATION: WFL  EDEMA:  None per patient report  POSTURE: rounded shoulders and forward head  LOWER EXTREMITY ROM:  WFL   Active  Right Eval Left Eval  Hip flexion    Hip extension    Hip abduction    Hip adduction    Hip internal rotation    Hip external rotation    Knee flexion    Knee extension    Ankle dorsiflexion    Ankle plantarflexion    Ankle inversion    Ankle eversion     (Blank rows = not tested)  LOWER EXTREMITY MMT:    MMT Right Eval Left Eval  Hip flexion 4 4  Hip extension    Hip abduction    Hip adduction    Hip internal rotation    Hip  external rotation    Knee flexion 4 4  Knee extension 4 4  Ankle dorsiflexion 4 4  Ankle plantarflexion    Ankle inversion    Ankle eversion    (Blank rows = not tested)  BED MOBILITY:  Mod I per patient report  TRANSFERS: Sit to stand: Modified independence  Assistive device utilized: None     Stand to sit: Modified independence  Assistive device utilized: None     Chair to chair: Modified independence  Assistive device utilized: None       RAMP:  Not tested  CURB:  Not tested  STAIRS: Not tested GAIT: Findings:  Gait pattern: WFL Distance walked: various clinic distances Assistive device utilized: Single point cane Level of assistance: Modified independence Comments: WFL   FUNCTIONAL TESTS:   30 sec sit to stand: 7 stands, no UE   SpO2 98% at rest, 96% after Heart rate 103 at rest, 118 after                                                                                                                              TREATMENT:  TherAct Assessed patient's vitals in LUE in sitting due to ongoing complaints of fatigue as well as her appearing more lethargic this date: Blood Pressure readings at rest, obtained 2 min apart:  80/53, HR 54, SpO2 98% 86/60, HR 61 86/56, HR 61  SciFit multi-peaks level 2 for 5 minutes using BUE/BLEs for neural priming for reciprocal movement, dynamic cardiovascular warmup and increased amplitude of stepping. RPE of 8-9/10 following activity.  Blood pressure following warm-up on SciFit: 94/60, HR 57 Pt exhibits appropriate blood pressure response to exercise but her heart rate remains low  To work on increasing BP and heart rate, resisted exercises with red TB: Seated resisted LAQ x 10 reps B Seated resisted marches x 10 reps B Blood pressure following resisted exercise: 87/40, HR 76 Pt exhibits appropriate heart rate response to exercise but her BP remains low  Encouraged patient to reach out to her PCP following this  session due to hypotensive BP readings this date and her overall feeling of fatigue and lethargy over the past few weeks. Reminded patient of spoons theory as well regarding energy conservation.   PATIENT EDUCATION: Education details: continue HEP and added to HEP, spoons theory review, reach out to PCP about BP readings this date and ongoing fatigue/lethargy Person educated: Patient Education method: Explanation, Demonstration, and Handouts Education comprehension: verbalized understanding, returned demonstration, and needs further education  HOME EXERCISE PROGRAM: To be reviewed/updated from previous POC: [id number]   Access Code: [facility] URL: [email or web address] Date: 05/30/2024 Prepared by: Waddell Southgate  Exercises - Sidelying Open Book Thoracic Lumbar Rotation and Extension  - 1 x daily - 7 x weekly - 1-2 sets - 10 reps - Supine Thoracic Mobilization Foam Roll Horizontal with Arm Stretch  - 1 x daily - 7 x weekly - 1-2 sets - 10 reps - Child's Pose with Sidebending  - 1 x daily - 7 x weekly - 1 sets - 3-5 reps - 30 sec hold - Child's Pose with Thread the Needle  - 1 x daily - 7 x weekly - 1 sets - 3-5 reps - 30 sec hold - Alternating Step Taps with Counter Support  - 1 x daily - 7 x weekly - 3 sets - 10 reps - Supine Lower Trunk Rotation  - 1 x daily - 7 x weekly - 2-3 sets - 10 reps - Half Kneeling Hip Flexor Stretch  - 1 x daily - 7 x weekly - 1 sets - 3-5 reps - 30 sec hold - Heel Sits  - 1 x daily - 7 x weekly - 3 sets - 10 reps - Hooklying Single Knee to Chest Stretch  - 1 x daily - 7 x weekly - 1 sets - 3-5 reps - 30 sec hold - Seated Knee Extension with Resistance  - 1 x daily - 7 x weekly - 3 sets - 10 reps - Seated March with Resistance  - 1 x daily - 7 x weekly - 3 sets - 10 reps  GOALS: Goals reviewed with patient? Yes  SHORT TERM GOALS: Target date: 06/16/2024   Pt will be independent with initial HEP for improved strength and  endurance. Baseline: Goal status: INITIAL  2.  Pt will increase her 30 sec sit to stands to 9 reps for improved endurance. Baseline: 7 reps (6/12) Goal status: INITIAL  3.  Pt will improve gait velocity to at least 2.5 ft/sec for improved gait efficiency and performance at mod I level  Baseline: 2.19 ft/sec no AD (6/12) Goal status: INITIAL  4.  Pt will ambulate greater than or equal to 750 feet on with LRAD and mod I for improved cardiovascular endurance and BLE strength.  Baseline: 635  ft with SPC mod I (6/12) Goal status: INITIAL   LONG TERM GOALS: Target date: 07/07/2024   Pt will be independent with final HEP for improved strength and endurance. Baseline:  Goal status: INITIAL  2.  Pt will increase her 30 sec sit to stands to 11 reps for improved endurance. Baseline: 7 reps (6/12) Goal status: INITIAL  3.  Pt will improve gait velocity to at least 2.75 ft/sec for improved gait efficiency and performance at mod I level  Baseline: 2.19 ft/sec no AD (6/12) Goal status: INITIAL  4.  Pt will ambulate greater than or equal to 1000 feet on with LRAD and mod I for improved cardiovascular endurance and BLE strength.  Baseline: 635 ft with SPC mod I (6/12) Goal status: INITIAL   ASSESSMENT:  CLINICAL IMPRESSION: Session limited by hypotensive BP. Emphasis of skilled PT session on assessing blood pressure and performing seated exercises in attempt to elevate BP. Pt does have an increase in her BP following warm-up on SciFit but her heart rate remains low. She does have a more elevated heart rate following resisted exercise but her BP remains low. Overall she exhibits increased fatigue and lethargy this date and has been experiencing these symptoms for at least a week. Encouraged her to reach out to her PCP about her low BP readings this session and her symptoms. Pt continues to have impaired endurance and benefits from continued skilled PT services to work on improving her  endurance and increasing her safety and independence with functional mobility. Continue POC.    OBJECTIVE IMPAIRMENTS: cardiopulmonary status limiting activity, decreased activity tolerance, decreased endurance, decreased mobility, difficulty walking, decreased strength, impaired perceived functional ability, and pain.   ACTIVITY LIMITATIONS: carrying, lifting, bending, standing, squatting, and stairs  PARTICIPATION LIMITATIONS: community activity and occupation  PERSONAL FACTORS: Time since onset of injury/illness/exacerbation and 3+ comorbidities:  long-Covid, positive ANA, arthralgia, anemia, B12 deficiency, mixed hyperlipidemia, prediabetesare also affecting patient's functional outcome.   REHAB POTENTIAL: Good  CLINICAL DECISION MAKING: Stable/uncomplicated  EVALUATION COMPLEXITY: Low  PLAN:  PT FREQUENCY: 2x/week  PT DURATION: 6 weeks  PLANNED INTERVENTIONS: 97164- PT Re-evaluation, 97750- Physical Performance Testing, 97110-Therapeutic exercises, 97530- Therapeutic activity, [id number]- Neuromuscular re-education, 97535- Self Care, [id number]- Manual therapy, [id number]- Gait training, [phone]- Canalith repositioning, [id number]- Aquatic Therapy, [phone]- Electrical stimulation (manual), [phone] (1-2 muscles), 20561 (3+ muscles)- Dry Needling, Patient/Family education, Balance training, Stair training, Taping, Joint mobilization, Spinal manipulation, Vestibular training, DME instructions, Cryotherapy, and Moist heat  PLAN FOR NEXT SESSION: monitor vitals and response to exercise, check BP, review prior HEP and addend as needed; SciFit vs elliptical vs treadmill, endurance training, LE functional strengthening (resisted sit to stands, lateral and monster walks, half kneel)   Waddell Southgate, PT Waddell Southgate, PT, DPT, CSRS  06/08/2024, 4:29 PM  For all possible CPT codes, reference the Planned Interventions line above.     Check all conditions that are expected to impact treatment: {Conditions  expected to impact treatment:Presence of Medical Equipment   If treatment provided at initial evaluation, no treatment charged due to lack of authorization.

## 2024-06-08 NOTE — Telephone Encounter (Signed)
 Attempt made to reach out to pt after call was disconnected: unable to reach: left voicemail.

## 2024-06-08 NOTE — Telephone Encounter (Signed)
 Copied from CRM [id number]. Topic: Clinical - Red Word Triage >> Jun 08, 2024  4:25 PM Avram MATSU wrote: Red Word that prompted transfer to Nurse Triage: patient is calling about low BP 87/40 and heart rate 76 heart rate.   FYI Only or Action Required?: FYI only for provider.  Patient was last seen in primary care on 05/11/2024 by Oley Bascom RAMAN, NP. Called Nurse Triage reporting Hypotension. Symptoms began today. Interventions attempted: Nothing. Symptoms are: gradually improving, last recorded BP was 94/60.  Triage Disposition: See PCP When Office is Open (Within 3 Days)  Patient/caregiver understands and will follow disposition?: Yes   Patient advised to go to UC or the ED if her symptoms worsen prior to her appointment. Patient verbalized understanding and agreement with this plan.     Answer Assessment - Initial Assessment Questions 1. BLOOD PRESSURE: What is the blood pressure? Did you take at least two measurements 5 minutes apart? 80/53 with HR 54  -sitting then   87/40  w/HR76 2. ONSET: When did you take your blood pressure?     today 3. HOW: How did you obtain the blood pressure? (e.g., visiting nurse, automatic home BP monitor)     PT 4. HISTORY: Do you have a history of low blood pressure? What is your blood pressure normally?     no 5. MEDICINES: Are you taking any medications for blood pressure? If Yes, ask: Have they been changed recently?     no 6. PULSE RATE: Do you know what your pulse rate is?      75 7. OTHER SYMPTOMS: Have you been sick recently? Have you had a recent injury?     Pt stated feeling sickly last few weeks 8. PREGNANCY: Is there any chance you are pregnant? When was your last menstrual period?     N/a  Protocols used: Blood Pressure - Low-A-AH

## 2024-06-10 ENCOUNTER — Encounter: Payer: Self-pay | Admitting: Nurse Practitioner

## 2024-06-10 ENCOUNTER — Ambulatory Visit: Payer: Self-pay | Admitting: Nurse Practitioner

## 2024-06-10 ENCOUNTER — Other Ambulatory Visit (HOSPITAL_COMMUNITY): Payer: Self-pay

## 2024-06-10 ENCOUNTER — Ambulatory Visit: Payer: Self-pay | Admitting: *Deleted

## 2024-06-10 ENCOUNTER — Ambulatory Visit: Admitting: Physical Therapy

## 2024-06-10 VITALS — BP 82/52 | HR 55

## 2024-06-10 VITALS — BP 129/85 | HR 74 | Temp 98.2°F | Wt 234.8 lb

## 2024-06-10 DIAGNOSIS — I959 Hypotension, unspecified: Secondary | ICD-10-CM

## 2024-06-10 DIAGNOSIS — R5381 Other malaise: Secondary | ICD-10-CM | POA: Diagnosis not present

## 2024-06-10 DIAGNOSIS — R2689 Other abnormalities of gait and mobility: Secondary | ICD-10-CM | POA: Diagnosis not present

## 2024-06-10 DIAGNOSIS — Z13228 Encounter for screening for other metabolic disorders: Secondary | ICD-10-CM | POA: Diagnosis not present

## 2024-06-10 DIAGNOSIS — M6281 Muscle weakness (generalized): Secondary | ICD-10-CM

## 2024-06-10 NOTE — Therapy (Signed)
 OUTPATIENT PHYSICAL THERAPY NEURO TREATMENT   Patient Name: Kelli Cooper MRN: [id number] DOB:1981-02-17, 43 y.o., female Today's Date: 06/10/2024   PCP: Oley Bascom RAMAN, NP REFERRING PROVIDER: Oley Bascom RAMAN, NP  END OF SESSION:  PT End of Session - 06/10/24 1317     Visit Number 5    Number of Visits 13   with eval   Date for PT Re-Evaluation 07/21/24    Authorization Type Medicaid [employer]    Authorization - Visit Number 5    Authorization - Number of Visits 5    PT Start Time 1315    PT Stop Time 1349   ended session early due to hypotension   PT Time Calculation (min) 34 min    Equipment Utilized During Treatment Gait belt    Activity Tolerance Patient limited by fatigue;Treatment limited secondary to medical complications (Comment)   hypotension   Behavior During Therapy WFL for tasks assessed/performed              Past Medical History:  Diagnosis Date   Anemia    Arthralgia    COVID    DM (diabetes mellitus) (HCC)    Iron  deficiency anemia    Muscle pain    Palpitations    Positive ANA (antinuclear antibody)    SOB (shortness of breath)    Past Surgical History:  Procedure Laterality Date   NO PAST SURGERIES     Patient Active Problem List   Diagnosis Date Noted   Low vitamin D  level 04/05/2024   Fibroids 04/04/2024   Heavy menstrual bleeding 02/01/2024   Iron  deficiency anemia 09/17/2023   Positive ANA (antinuclear antibody) 08/06/2023   Muscle pain 07/30/2023    ONSET DATE: 05/11/2024 (referral date)  REFERRING DIAG: R53.81 (ICD-10-CM) - Physical deconditioning  THERAPY DIAG:  Physical deconditioning  Other abnormalities of gait and mobility  Muscle weakness (generalized)  Rationale for Evaluation and Treatment: Rehabilitation  SUBJECTIVE:                                                                                                                                                                                              SUBJECTIVE STATEMENT:  Kelli Cooper  Pt did see her PCP this morning, her BP was normal and they did an EKG and that seemed normal. She also did some blood work that the results are not back from yet. Pt did end up going grocery shopping after last visit and that went well, she did get tired towards the end.  She is feeling better today. Pt has not worked on her exercises the past few days due  to low energy levels.   Pt accompanied by: self  PERTINENT HISTORY: PMH: long-Covid, positive ANA, arthralgia, anemia, B12 deficiency, mixed hyperlipidemia, prediabetes  PAIN:  Are you having pain? No  PRECAUTIONS: None  RED FLAGS: None   WEIGHT BEARING RESTRICTIONS: No  FALLS: Has patient fallen in last 6 months? No  LIVING ENVIRONMENT: Lives with: lives alone (with her dog) Lives in: House/apartment Stairs: Yes: Internal: 12 steps; on right going up Has following equipment at home: Single point cane  PLOF: Independent with gait, Independent with transfers, and Requires assistive device for independence  PATIENT GOALS: to be able to walk without a cane  OBJECTIVE:  Note: Objective measures were completed at Evaluation unless otherwise noted.  DIAGNOSTIC FINDINGS: None relevant to this POC  COGNITION: Overall cognitive status: Within functional limits for tasks assessed   SENSATION: WFL, no N/T  COORDINATION: WFL  EDEMA:  None per patient report  POSTURE: rounded shoulders and forward head  LOWER EXTREMITY ROM:  WFL   Active  Right Eval Left Eval  Hip flexion    Hip extension    Hip abduction    Hip adduction    Hip internal rotation    Hip external rotation    Knee flexion    Knee extension    Ankle dorsiflexion    Ankle plantarflexion    Ankle inversion    Ankle eversion     (Blank rows = not tested)  LOWER EXTREMITY MMT:    MMT Right Eval Left Eval  Hip flexion 4 4  Hip extension    Hip abduction    Hip adduction    Hip internal rotation     Hip external rotation    Knee flexion 4 4  Knee extension 4 4  Ankle dorsiflexion 4 4  Ankle plantarflexion    Ankle inversion    Ankle eversion    (Blank rows = not tested)  BED MOBILITY:  Mod I per patient report  TRANSFERS: Sit to stand: Modified independence  Assistive device utilized: None     Stand to sit: Modified independence  Assistive device utilized: None     Chair to chair: Modified independence  Assistive device utilized: None       RAMP:  Not tested  CURB:  Not tested  STAIRS: Not tested GAIT: Findings:  Gait pattern: WFL Distance walked: various clinic distances Assistive device utilized: Single point cane Level of assistance: Modified independence Comments: WFL   FUNCTIONAL TESTS:   30 sec sit to stand: 7 stands, no UE   SpO2 98% at rest, 96% after Heart rate 103 at rest, 118 after                                                                                                                              TREATMENT:  Self-Care/Home Management: Vitals:   06/10/24 1320  BP: (!) 82/52  Pulse: (!) 55   Assessed in LUE  in sitting at rest. Reassessed manually due to low reading: 80/50. Reassessed with a 2nd BP machine: 89/53.  BP following warm-up on NuStep: 87/55.  Pt continues to exhibit ongoing hypotension this session with BP that does not elevate with aerobic exercise. Ended session early due to ongoing hypotension despite patient having normal readings at her PCP office this morning. Encouraged patient to continue to monitor for symptoms/energy levels and reach out to PCP office again with readings from this session.  TherAct NuStep level 2 x 8 min with use of B UE/LE for global endurance training and in attempts to increase BP. RPE 7/10.   PATIENT EDUCATION: Education details: continue HEP, reach out to PCP office about BP readings this date Person educated: Patient Education method: Explanation, Demonstration, and  Handouts Education comprehension: verbalized understanding, returned demonstration, and needs further education  HOME EXERCISE PROGRAM: To be reviewed/updated from previous POC: [email or web address]   Access Code: [facility] URL: [email or web address] Date: 05/30/2024 Prepared by: Waddell Southgate  Exercises - Sidelying Open Book Thoracic Lumbar Rotation and Extension  - 1 x daily - 7 x weekly - 1-2 sets - 10 reps - Supine Thoracic Mobilization Foam Roll Horizontal with Arm Stretch  - 1 x daily - 7 x weekly - 1-2 sets - 10 reps - Child's Pose with Sidebending  - 1 x daily - 7 x weekly - 1 sets - 3-5 reps - 30 sec hold - Child's Pose with Thread the Needle  - 1 x daily - 7 x weekly - 1 sets - 3-5 reps - 30 sec hold - Alternating Step Taps with Counter Support  - 1 x daily - 7 x weekly - 3 sets - 10 reps - Supine Lower Trunk Rotation  - 1 x daily - 7 x weekly - 2-3 sets - 10 reps - Half Kneeling Hip Flexor Stretch  - 1 x daily - 7 x weekly - 1 sets - 3-5 reps - 30 sec hold - Heel Sits  - 1 x daily - 7 x weekly - 3 sets - 10 reps - Hooklying Single Knee to Chest Stretch  - 1 x daily - 7 x weekly - 1 sets - 3-5 reps - 30 sec hold - Seated Knee Extension with Resistance  - 1 x daily - 7 x weekly - 3 sets - 10 reps - Seated March with Resistance  - 1 x daily - 7 x weekly - 3 sets - 10 reps  GOALS: Goals reviewed with patient? Yes  SHORT TERM GOALS: Target date: 06/16/2024   Pt will be independent with initial HEP for improved strength and endurance. Baseline: Goal status: INITIAL  2.  Pt will increase her 30 sec sit to stands to 9 reps for improved endurance. Baseline: 7 reps (6/12) Goal status: INITIAL  3.  Pt will improve gait velocity to at least 2.5 ft/sec for improved gait efficiency and performance at mod I level  Baseline: 2.19 ft/sec no AD (6/12) Goal status: INITIAL  4.  Pt will ambulate greater than or equal to 750 feet on with LRAD and mod I for improved cardiovascular  endurance and BLE strength.  Baseline: 635 ft with SPC mod I (6/12) Goal status: INITIAL   LONG TERM GOALS: Target date: 07/07/2024   Pt will be independent with final HEP for improved strength and endurance. Baseline:  Goal status: INITIAL  2.  Pt will increase her 30 sec sit to stands to 11 reps for improved endurance. Baseline: 7 reps (  6/12) Goal status: INITIAL  3.  Pt will improve gait velocity to at least 2.75 ft/sec for improved gait efficiency and performance at mod I level  Baseline: 2.19 ft/sec no AD (6/12) Goal status: INITIAL  4.  Pt will ambulate greater than or equal to 1000 feet on with LRAD and mod I for improved cardiovascular endurance and BLE strength.  Baseline: 635 ft with SPC mod I (6/12) Goal status: INITIAL   ASSESSMENT:  CLINICAL IMPRESSION: Session limited by hypotensive BP again this session. Emphasis of skilled PT session on assessing blood pressure and performing aerobic exercise in attempt to elevate BP. Pt does NOT have an increase in her BP following warm-up on NuStep this date. Encouraged her to reach out to her PCP office again about her low BP readings this session though she is not symptomatic this session. Pt continues to have impaired endurance and benefits from continued skilled PT services to work on improving her endurance and increasing her safety and independence with functional mobility. Plan to reassess goals next visit and resubmit to insurance to cover remaining visits. Continue POC.    OBJECTIVE IMPAIRMENTS: cardiopulmonary status limiting activity, decreased activity tolerance, decreased endurance, decreased mobility, difficulty walking, decreased strength, impaired perceived functional ability, and pain.   ACTIVITY LIMITATIONS: carrying, lifting, bending, standing, squatting, and stairs  PARTICIPATION LIMITATIONS: community activity and occupation  PERSONAL FACTORS: Time since onset of injury/illness/exacerbation and 3+  comorbidities:  long-Covid, positive ANA, arthralgia, anemia, B12 deficiency, mixed hyperlipidemia, prediabetesare also affecting patient's functional outcome.   REHAB POTENTIAL: Good  CLINICAL DECISION MAKING: Stable/uncomplicated  EVALUATION COMPLEXITY: Low  PLAN:  PT FREQUENCY: 2x/week  PT DURATION: 6 weeks  PLANNED INTERVENTIONS: 97164- PT Re-evaluation, 97750- Physical Performance Testing, 97110-Therapeutic exercises, 97530- Therapeutic activity, [id number]- Neuromuscular re-education, 97535- Self Care, [id number]- Manual therapy, [id number]- Gait training, [phone]- Canalith repositioning, [id number]- Aquatic Therapy, [phone]- Electrical stimulation (manual), [phone] (1-2 muscles), [id number] (3+ muscles)- Dry Needling, Patient/Family education, Balance training, Stair training, Taping, Joint mobilization, Spinal manipulation, Vestibular training, DME instructions, Cryotherapy, and Moist heat  PLAN FOR NEXT SESSION: NEED TO SUBMIT TO COVER MORE VISITS, monitor vitals and response to exercise, check BP, review prior HEP and addend as needed; SciFit vs elliptical vs treadmill, endurance training, LE functional strengthening (resisted sit to stands, lateral and monster walks, half kneel)   Waddell Southgate, PT Waddell Southgate, PT, DPT, CSRS  06/10/2024, 1:50 PM  For all possible CPT codes, reference the Planned Interventions line above.     Check all conditions that are expected to impact treatment: {Conditions expected to impact treatment:Presence of Medical Equipment   If treatment provided at initial evaluation, no treatment charged due to lack of authorization.

## 2024-06-10 NOTE — Patient Instructions (Signed)
 1. Hypotension, unspecified hypotension type (Primary)  - CBC - Comprehensive metabolic panel with GFR - TSH - EKG 12-Lead

## 2024-06-10 NOTE — Progress Notes (Signed)
 Subjective   Patient ID: Kelli Cooper, female    DOB: [birth year], 43 y.o.   MRN: [id number]  Chief Complaint  Patient presents with   Fatigue    Patient states that she is feeling tired     Referring provider: Oley Bascom RAMAN, NP  Kelli Cooper is a 43 y.o. female with Past Medical History: No date: Anemia No date: Arthralgia No date: COVID No date: DM (diabetes mellitus) (HCC) No date: Iron  deficiency anemia No date: Muscle pain No date: Palpitations No date: Positive ANA (antinuclear antibody) No date: SOB (shortness of breath)   HPI  Patient presents today for a acute visit.  She states that she has been feeling fatigued recently and blood pressure was low at recent physical therapy appointment.  Patient states that she has been having some heart palpitations.  She is followed by cardiology.  We will check EKG today.  We will check labs today.  We discussed the importance of staying well-hydrated.  Blood pressure was stable in the office today.  Denies f/c/s, n/v/d, hemoptysis, PND, leg swelling Denies chest pain or edema     No Known Allergies  Immunization History  Administered Date(s) Administered   PFIZER(Purple Top)SARS-COV-2 Vaccination 03/29/2020, 04/23/2020    Tobacco History: Social History   Tobacco Use  Smoking Status Never  Smokeless Tobacco Never   Counseling given: Not Answered   Outpatient Encounter Medications as of 06/10/2024  Medication Sig   cyanocobalamin  (VITAMIN B12) 1000 MCG tablet Take 1 tablet (1,000 mcg total) by mouth daily.   ibuprofen  (ADVIL ) 200 MG tablet Take 200 mg by mouth every 6 (six) hours as needed.   norethindrone  (MICRONOR ) 0.35 MG tablet Take 1 tablet (0.35 mg total) by mouth daily.   Vitamin D , Ergocalciferol , (DRISDOL ) 1.25 MG (50000 UNIT) CAPS capsule Take 1 capsule (50,000 Units total) by mouth every 7 (seven) days.   Iron , Ferrous Sulfate , 325 (65 Fe) MG TABS Take 325 mg by mouth daily. (Patient not  taking: Reported on 06/10/2024)   No facility-administered encounter medications on file as of 06/10/2024.    Review of Systems  Review of Systems  Constitutional: Negative.   HENT: Negative.    Cardiovascular: Negative.   Gastrointestinal: Negative.   Allergic/Immunologic: Negative.   Neurological: Negative.   Psychiatric/Behavioral: Negative.       Objective:   BP 129/85   Pulse 74   Temp 98.2 F (36.8 C) (Oral)   Wt 234 lb 12.8 oz (106.5 kg)   LMP 05/11/2024 (Approximate)   SpO2 100%   BMI 41.59 kg/m   Wt Readings from Last 5 Encounters:  06/10/24 234 lb 12.8 oz (106.5 kg)  05/27/24 218 lb (98.9 kg)  05/25/24 220 lb 6.4 oz (100 kg)  05/18/24 227 lb 6.4 oz (103.1 kg)  05/11/24 226 lb 9.6 oz (102.8 kg)     Physical Exam Vitals and nursing note reviewed.  Constitutional:      General: She is not in acute distress.    Appearance: She is well-developed.   Cardiovascular:     Rate and Rhythm: Normal rate and regular rhythm.  Pulmonary:     Effort: Pulmonary effort is normal.     Breath sounds: Normal breath sounds.   Neurological:     Mental Status: She is alert and oriented to person, place, and time.       Assessment & Plan:   Hypotension, unspecified hypotension type -     CBC -  Comprehensive metabolic panel with GFR -     TSH -     EKG 12-Lead     Return if symptoms worsen or fail to improve.   Bascom GORMAN Borer, NP 06/10/2024

## 2024-06-10 NOTE — Telephone Encounter (Signed)
 Copied from CRM [id number]. Topic: Clinical - Red Word Triage >> Jun 10, 2024  1:55 PM Winona R wrote: Low blood pressure  87/55 no symptoms. Reason for Disposition  [1] Systolic BP 90-110 AND [2] taking blood pressure medications AND [3] NOT dizzy, lightheaded or weak    Pt just seen this morning so did not want to make another appt.   Just wanted to pass the information along to Bascom Borer, NP  Answer Assessment - Initial Assessment Questions 1. BLOOD PRESSURE: What is the blood pressure? Did you take at least two measurements 5 minutes apart?     I have a low BP of 87/55.   Just now taken.   A PT came to my house and checked my BP.   I usually have great BP.   The last few days it's been a problem.   I had an emergency appt this morning with my PCP and my BP was normal in the office so I don't know what is going on.  I had blood work done this morning.   I'll wait until Monday and see what is going on.   I feel fine.   Denies being dizzy or lightheaded.    2. ONSET: When did you take your blood pressure?     Just a few min. Ago  The physical therapist took it when she came to my house to do my therapy.   I've been sick for 3 a half years and been sleepy for 3 years too.   I have long Covid.   I have a lot of different health issues now.   I saw Bascom Borer, NP this morning and my BP was fine.  I've never had this problem with my BP. 3. HOW: How did you obtain the blood pressure? (e.g., visiting nurse, automatic home BP monitor)     2 automatic monitors and 1 manual monitor the PT used on me a few minutes ago.   All 3 showed a low BP 4. HISTORY: Do you have a history of low blood pressure? What is your blood pressure normally?     No    5. MEDICINES: Are you taking any medications for blood pressure? If Yes, ask: Have they been changed recently?     No medications.    6. PULSE RATE: Do you know what your pulse rate is?      My pulse was 55 a few minutes ago.   This is low  for me.   I don't pay attention to pulse.  I don't know what I normally run.    When BP top number was below 100 I thought that was weird.    She used the same arm and my arm was level with my heart.   7. OTHER SYMPTOMS: Have you been sick recently? Have you had a recent injury?     I have long Covid I just wanted to pass this along to Bascom Borer, NP 8. PREGNANCY: Is there any chance you are pregnant? When was your last menstrual period?     Not asked  Protocols used: Blood Pressure - Low-A-AH FYI Only or Action Required?: FYI only for provider.  Patient was last seen in primary care on 06/10/2024 by Borer Bascom RAMAN, NP. Called Nurse Triage reporting HypertensionHypotension  See triage notes.. Symptoms began today. Interventions attempted: Nothing. Symptoms are: stable.  Triage Disposition: Call PCP Now  Patient/caregiver understands and will follow disposition?: Yes Pt did not want an  appt since she was seen this morning 6/27 by Bascom Borer, NP.   Just wanted the info. Passed along.

## 2024-06-11 LAB — CBC
Hematocrit: 34.3 % (ref 34.0–46.6)
Hemoglobin: 10.4 g/dL — ABNORMAL LOW (ref 11.1–15.9)
MCH: 28.6 pg (ref 26.6–33.0)
MCHC: 30.3 g/dL — ABNORMAL LOW (ref 31.5–35.7)
MCV: 94 fL (ref 79–97)
Platelets: 345 10*3/uL (ref 150–450)
RBC: 3.64 x10E6/uL — ABNORMAL LOW (ref 3.77–5.28)
RDW: 13.8 % (ref 11.7–15.4)
WBC: 7.5 10*3/uL (ref 3.4–10.8)

## 2024-06-11 LAB — COMPREHENSIVE METABOLIC PANEL WITH GFR
ALT: 16 IU/L (ref 0–32)
AST: 15 IU/L (ref 0–40)
Albumin: 3.8 g/dL — ABNORMAL LOW (ref 3.9–4.9)
Alkaline Phosphatase: 57 IU/L (ref 44–121)
BUN/Creatinine Ratio: 15 (ref 9–23)
BUN: 11 mg/dL (ref 6–24)
Bilirubin Total: <0.2 mg/dL (ref 0.0–1.2)
CO2: 24 mmol/L (ref 20–29)
Calcium: 8.8 mg/dL (ref 8.7–10.2)
Chloride: 103 mmol/L (ref 96–106)
Creatinine, Ser: 0.72 mg/dL (ref 0.57–1.00)
Globulin, Total: 2.2 g/dL (ref 1.5–4.5)
Glucose: 98 mg/dL (ref 70–99)
Potassium: 5.5 mmol/L — ABNORMAL HIGH (ref 3.5–5.2)
Sodium: 140 mmol/L (ref 134–144)
Total Protein: 6 g/dL (ref 6.0–8.5)
eGFR: 105 mL/min/{1.73_m2} (ref >59)

## 2024-06-11 LAB — TSH: TSH: 4.85 u[IU]/mL — ABNORMAL HIGH (ref 0.450–4.500)

## 2024-06-14 ENCOUNTER — Ambulatory Visit: Attending: Nurse Practitioner | Admitting: Physical Therapy

## 2024-06-14 VITALS — BP 92/57 | HR 67

## 2024-06-14 DIAGNOSIS — R5381 Other malaise: Secondary | ICD-10-CM | POA: Diagnosis not present

## 2024-06-14 DIAGNOSIS — R2689 Other abnormalities of gait and mobility: Secondary | ICD-10-CM | POA: Diagnosis not present

## 2024-06-14 DIAGNOSIS — M6281 Muscle weakness (generalized): Secondary | ICD-10-CM | POA: Insufficient documentation

## 2024-06-14 NOTE — Therapy (Signed)
 OUTPATIENT PHYSICAL THERAPY NEURO TREATMENT   Patient Name: Kelli Cooper MRN: [id number] DOB:[birth year], 43 y.o., female Today's Date: 06/14/2024   PCP: Oley Bascom RAMAN, NP REFERRING PROVIDER: Oley Bascom RAMAN, NP  END OF SESSION:  PT End of Session - 06/14/24 1436     Visit Number 6    Number of Visits 13   with eval   Date for PT Re-Evaluation 07/21/24    Authorization Type Medicaid [employer]    Authorization - Number of Visits 5    PT Start Time 1435    PT Stop Time 1520    PT Time Calculation (min) 45 min    Equipment Utilized During Treatment Gait belt    Activity Tolerance Patient limited by fatigue;Treatment limited secondary to medical complications (Comment)   hypotension   Behavior During Therapy WFL for tasks assessed/performed              Past Medical History:  Diagnosis Date   Anemia    Arthralgia    COVID    DM (diabetes mellitus) (HCC)    Iron  deficiency anemia    Muscle pain    Palpitations    Positive ANA (antinuclear antibody)    SOB (shortness of breath)    Past Surgical History:  Procedure Laterality Date   NO PAST SURGERIES     Patient Active Problem List   Diagnosis Date Noted   Low vitamin D  level 04/05/2024   Fibroids 04/04/2024   Heavy menstrual bleeding 02/01/2024   Iron  deficiency anemia 09/17/2023   Positive ANA (antinuclear antibody) 08/06/2023   Muscle pain 07/30/2023    ONSET DATE: 05/11/2024 (referral date)  REFERRING DIAG: R53.81 (ICD-10-CM) - Physical deconditioning  THERAPY DIAG:  Physical deconditioning  Other abnormalities of gait and mobility  Muscle weakness (generalized)  Rationale for Evaluation and Treatment: Rehabilitation  SUBJECTIVE:                                                                                                                                                                                             SUBJECTIVE STATEMENT:  Kelli Cooper  Pt got her blood work results  back, found out she is iron  deficient again so started taking iron  pills again yesterday. She feels like today she is feeling better, does still feel tired and have brain fog.  She has not been doing her exercises or stretches or anything over the past few days, was just taking it easy. She plans to try to take a walk this evening.   Pt accompanied by: self  PERTINENT HISTORY: PMH: long-Covid, positive ANA, arthralgia, anemia, B12 deficiency, mixed hyperlipidemia,  prediabetes  PAIN:  Are you having pain? No  PRECAUTIONS: None  RED FLAGS: None   WEIGHT BEARING RESTRICTIONS: No  FALLS: Has patient fallen in last 6 months? No  LIVING ENVIRONMENT: Lives with: lives alone (with her dog) Lives in: House/apartment Stairs: Yes: Internal: 12 steps; on right going up Has following equipment at home: Single point cane  PLOF: Independent with gait, Independent with transfers, and Requires assistive device for independence  PATIENT GOALS: to be able to walk without a cane  OBJECTIVE:  Note: Objective measures were completed at Evaluation unless otherwise noted.  DIAGNOSTIC FINDINGS: None relevant to this POC  COGNITION: Overall cognitive status: Within functional limits for tasks assessed   SENSATION: WFL, no N/T  COORDINATION: WFL  EDEMA:  None per patient report  POSTURE: rounded shoulders and forward head  LOWER EXTREMITY ROM:  WFL   Active  Right Eval Left Eval  Hip flexion    Hip extension    Hip abduction    Hip adduction    Hip internal rotation    Hip external rotation    Knee flexion    Knee extension    Ankle dorsiflexion    Ankle plantarflexion    Ankle inversion    Ankle eversion     (Blank rows = not tested)  LOWER EXTREMITY MMT:    MMT Right Eval Left Eval  Hip flexion 4 4  Hip extension    Hip abduction    Hip adduction    Hip internal rotation    Hip external rotation    Knee flexion 4 4  Knee extension 4 4  Ankle dorsiflexion 4 4   Ankle plantarflexion    Ankle inversion    Ankle eversion    (Blank rows = not tested)  BED MOBILITY:  Mod I per patient report  TRANSFERS: Sit to stand: Modified independence  Assistive device utilized: None     Stand to sit: Modified independence  Assistive device utilized: None     Chair to chair: Modified independence  Assistive device utilized: None       RAMP:  Not tested  CURB:  Not tested  STAIRS: Not tested GAIT: Findings:  Gait pattern: WFL Distance walked: various clinic distances Assistive device utilized: Single point cane Level of assistance: Modified independence Comments: WFL   FUNCTIONAL TESTS:   30 sec sit to stand: 7 stands, no UE   SpO2 98% at rest, 96% after Heart rate 103 at rest, 118 after                                                                                                                              TREATMENT:  Self-Care/Home Management: Vitals:   06/14/24 1543 06/14/24 1544  BP: (!) 104/53 (!) 92/57  Pulse: 64 67    Initial reading obtained in LUE in sitting at rest. Reassessed following warm-up on SciFit, BP actually decreases with activity.  Pt  continues to exhibit ongoing hypotension this session with BP that does not elevate with aerobic exercise. However, BP readings are higher than those obtained last week in therapy. Encouraged patient to reach out to her PCP to see if she can get a BP machine for use at home so she can track her BP and keep an eye on trends.  TherAct SciFit multi-peaks level 2 for 8 minutes using BUE/BLEs for neural priming for reciprocal movement, dynamic cardiovascular warmup and increased amplitude of stepping as well as to work on increasing her BP. RPE of 6/10 following activity.   Supine exercises to work on muscle strengthening and elevating her BP: Supine bridges x 10 reps with 5 sec hold SLR x 5 reps B   Physical Performance For STG assessment 30 sec sit to stand: 6  stands Gait speed: 2.6 ft/sec : 439 ft with SPC, 7/10 RPE    PATIENT EDUCATION: Education details: continue HEP, results of OM and functional implications, reach out to PCP office about getting a BP machine and about ongoing hypotension though it is improving Person educated: Patient Education method: Medical illustrator Education comprehension: verbalized understanding, returned demonstration, and needs further education  HOME EXERCISE PROGRAM: To be reviewed/updated from previous POC: [email or web address]   Access Code: [facility] URL: [email or web address] Date: 05/30/2024 Prepared by: Waddell Southgate  Exercises - Sidelying Open Book Thoracic Lumbar Rotation and Extension  - 1 x daily - 7 x weekly - 1-2 sets - 10 reps - Supine Thoracic Mobilization Foam Roll Horizontal with Arm Stretch  - 1 x daily - 7 x weekly - 1-2 sets - 10 reps - Child's Pose with Sidebending  - 1 x daily - 7 x weekly - 1 sets - 3-5 reps - 30 sec hold - Child's Pose with Thread the Needle  - 1 x daily - 7 x weekly - 1 sets - 3-5 reps - 30 sec hold - Alternating Step Taps with Counter Support  - 1 x daily - 7 x weekly - 3 sets - 10 reps - Supine Lower Trunk Rotation  - 1 x daily - 7 x weekly - 2-3 sets - 10 reps - Half Kneeling Hip Flexor Stretch  - 1 x daily - 7 x weekly - 1 sets - 3-5 reps - 30 sec hold - Heel Sits  - 1 x daily - 7 x weekly - 3 sets - 10 reps - Hooklying Single Knee to Chest Stretch  - 1 x daily - 7 x weekly - 1 sets - 3-5 reps - 30 sec hold - Seated Knee Extension with Resistance  - 1 x daily - 7 x weekly - 3 sets - 10 reps - Seated March with Resistance  - 1 x daily - 7 x weekly - 3 sets - 10 reps  GOALS: Goals reviewed with patient? Yes  SHORT TERM GOALS: Target date: 06/16/2024   Pt will be independent with initial HEP for improved strength and endurance. Baseline: Goal status: MET  2.  Pt will increase her 30 sec sit to stands to 9 reps for improved  endurance. Baseline: 7 reps (6/12), 6 reps (7/1) Goal status: NOT MET  3.  Pt will improve gait velocity to at least 2.5 ft/sec for improved gait efficiency and performance at mod I level  Baseline: 2.19 ft/sec no AD (6/12), 2.6 ft/sec no AD (7/1) Goal status: MET  4.  Pt will ambulate greater than or equal to 750 feet on with LRAD and mod  I for improved cardiovascular endurance and BLE strength.  Baseline: 635 ft with SPC mod I (6/12), 439 ft with SPC mod I (7/1) Goal status: NOT MET   LONG TERM GOALS: Target date: 07/07/2024   Pt will be independent with final HEP for improved strength and endurance. Baseline:  Goal status: INITIAL  2.  Pt will increase her 30 sec sit to stands to 9 reps for improved endurance. Baseline: 7 reps (6/12), 6 reps (7/1) Goal status: REVISED/DOWNGRADED  3.  Pt will improve gait velocity to at least 2.75 ft/sec for improved gait efficiency and performance at mod I level  Baseline: 2.19 ft/sec no AD (6/12), 2.6 ft/sec no AD (7/1) Goal status: INITIAL  4.  Pt will ambulate greater than or equal to 700 feet on with LRAD and mod I for improved cardiovascular endurance and BLE strength.  Baseline: 635 ft with SPC mod I (6/12), 439 ft with SPC mod I (7/1) Goal status: REVISED/DOWNGRADED   ASSESSMENT:  CLINICAL IMPRESSION: Emphasis of skilled PT session on continuing to assess blood pressure, performing aerobic exercise in attempt to elevate BP, and assessing STG. Pt does NOT have an increase in her BP following warm-up on SciFit this date but her readings are improved as compared to last week. Encouraged her to reach out to her PCP office about getting a BP machine to use at home so she can continue to track her readings. Pt continues to have impaired endurance and benefits from continued skilled PT services to work on improving her endurance and increasing her safety and independence with functional mobility. She has met 2/4 STG assessed this date  due to being independent with her initial HEP and increasing her gait speed to 2.6 ft/sec. She actually scored lower on the 30 sec sit to stand and , indicating impaired endurance. Due to her BP readings over the past few visits and her blood work showing she has lower iron  levels it is understandable that her endurance remains impaired. Continue POC.    OBJECTIVE IMPAIRMENTS: cardiopulmonary status limiting activity, decreased activity tolerance, decreased endurance, decreased mobility, difficulty walking, decreased strength, impaired perceived functional ability, and pain.   ACTIVITY LIMITATIONS: carrying, lifting, bending, standing, squatting, and stairs  PARTICIPATION LIMITATIONS: community activity and occupation  PERSONAL FACTORS: Time since onset of injury/illness/exacerbation and 3+ comorbidities:  long-Covid, positive ANA, arthralgia, anemia, B12 deficiency, mixed hyperlipidemia, prediabetesare also affecting patient's functional outcome.   REHAB POTENTIAL: Good  CLINICAL DECISION MAKING: Stable/uncomplicated  EVALUATION COMPLEXITY: Low  PLAN:  PT FREQUENCY: 2x/week  PT DURATION: 6 weeks  PLANNED INTERVENTIONS: 97164- PT Re-evaluation, 97750- Physical Performance Testing, 97110-Therapeutic exercises, 97530- Therapeutic activity, [id number]- Neuromuscular re-education, 97535- Self Care, [id number]- Manual therapy, [id number]- Gait training, [id number]- Canalith repositioning, [id number]- Aquatic Therapy, [id number]- Electrical stimulation (manual), [id number] (1-2 muscles), [id number] (3+ muscles)- Dry Needling, Patient/Family education, Balance training, Stair training, Taping, Joint mobilization, Spinal manipulation, Vestibular training, DME instructions, Cryotherapy, and Moist heat  PLAN FOR NEXT SESSION: monitor vitals and response to exercise, check BP, review prior HEP and addend as needed; SciFit vs elliptical vs treadmill, endurance training, LE functional strengthening (resisted sit to stands, lateral and  monster walks, half kneel)   Waddell Southgate, PT Waddell Southgate, PT, DPT, CSRS  06/14/2024, 3:44 PM  For all possible CPT codes, reference the Planned Interventions line above.     Check all conditions that are expected to impact treatment: {Conditions expected to impact treatment:Presence of Medical Equipment   If treatment provided at  initial evaluation, no treatment charged due to lack of authorization.

## 2024-06-19 ENCOUNTER — Ambulatory Visit: Payer: Self-pay | Admitting: Nurse Practitioner

## 2024-06-21 ENCOUNTER — Ambulatory Visit: Admitting: Physical Therapy

## 2024-06-21 VITALS — BP 91/61 | HR 60

## 2024-06-21 DIAGNOSIS — R5381 Other malaise: Secondary | ICD-10-CM

## 2024-06-21 DIAGNOSIS — R2689 Other abnormalities of gait and mobility: Secondary | ICD-10-CM | POA: Diagnosis not present

## 2024-06-21 DIAGNOSIS — M6281 Muscle weakness (generalized): Secondary | ICD-10-CM

## 2024-06-21 NOTE — Therapy (Signed)
 OUTPATIENT PHYSICAL THERAPY NEURO TREATMENT   Patient Name: Kelli Cooper MRN: [id number] DOB:1981/06/04, 43 y.o., female Today's Date: 06/21/2024   PCP: Oley Bascom RAMAN, NP REFERRING PROVIDER: Oley Bascom RAMAN, NP  END OF SESSION:  PT End of Session - 06/21/24 1533     Visit Number 7    Number of Visits 13   with eval   Date for PT Re-Evaluation 07/21/24    Authorization Type Medicaid [employer]    Authorization - Visit Number 1    Authorization - Number of Visits 4    Progress Note Due on Visit 10    PT Start Time 1530    PT Stop Time 1614    PT Time Calculation (min) 44 min    Equipment Utilized During Treatment Gait belt    Activity Tolerance Patient tolerated treatment well    Behavior During Therapy WFL for tasks assessed/performed               Past Medical History:  Diagnosis Date   Anemia    Arthralgia    COVID    DM (diabetes mellitus) (HCC)    Iron  deficiency anemia    Muscle pain    Palpitations    Positive ANA (antinuclear antibody)    SOB (shortness of breath)    Past Surgical History:  Procedure Laterality Date   NO PAST SURGERIES     Patient Active Problem List   Diagnosis Date Noted   Low vitamin D  level 04/05/2024   Fibroids 04/04/2024   Heavy menstrual bleeding 02/01/2024   Iron  deficiency anemia 09/17/2023   Positive ANA (antinuclear antibody) 08/06/2023   Muscle pain 07/30/2023    ONSET DATE: 05/11/2024 (referral date)  REFERRING DIAG: R53.81 (ICD-10-CM) - Physical deconditioning  THERAPY DIAG:  Physical deconditioning  Other abnormalities of gait and mobility  Muscle weakness (generalized)  Rationale for Evaluation and Treatment: Rehabilitation  SUBJECTIVE:                                                                                                                                                                                             SUBJECTIVE STATEMENT:  Kelli Cooper  Pt reports she feels like she  has more energy since she started taking iron  pills, no more infusions planned yet but is going to keep taking the pills. Denies any falls.  Pt took two walks yesterday and did her stretches and felt good afterwards. She has also had to move stuff around her house and was worried about how much energy that would take and has felt good with it so far.   Pt accompanied by:  self  PERTINENT HISTORY: PMH: long-Covid, positive ANA, arthralgia, anemia, B12 deficiency, mixed hyperlipidemia, prediabetes  PAIN:  Are you having pain? No  PRECAUTIONS: None  RED FLAGS: None   WEIGHT BEARING RESTRICTIONS: No  FALLS: Has patient fallen in last 6 months? No  LIVING ENVIRONMENT: Lives with: lives alone (with her dog) Lives in: House/apartment Stairs: Yes: Internal: 12 steps; on right going up Has following equipment at home: Single point cane  PLOF: Independent with gait, Independent with transfers, and Requires assistive device for independence  PATIENT GOALS: to be able to walk without a cane  OBJECTIVE:  Note: Objective measures were completed at Evaluation unless otherwise noted.  DIAGNOSTIC FINDINGS: None relevant to this POC  COGNITION: Overall cognitive status: Within functional limits for tasks assessed   SENSATION: WFL, no N/T  COORDINATION: WFL  EDEMA:  None per patient report  POSTURE: rounded shoulders and forward head  LOWER EXTREMITY ROM:  WFL   Active  Right Eval Left Eval  Hip flexion    Hip extension    Hip abduction    Hip adduction    Hip internal rotation    Hip external rotation    Knee flexion    Knee extension    Ankle dorsiflexion    Ankle plantarflexion    Ankle inversion    Ankle eversion     (Blank rows = not tested)  LOWER EXTREMITY MMT:    MMT Right Eval Left Eval  Hip flexion 4 4  Hip extension    Hip abduction    Hip adduction    Hip internal rotation    Hip external rotation    Knee flexion 4 4  Knee extension 4 4   Ankle dorsiflexion 4 4  Ankle plantarflexion    Ankle inversion    Ankle eversion    (Blank rows = not tested)  BED MOBILITY:  Mod I per patient report  TRANSFERS: Sit to stand: Modified independence  Assistive device utilized: None     Stand to sit: Modified independence  Assistive device utilized: None     Chair to chair: Modified independence  Assistive device utilized: None       RAMP:  Not tested  CURB:  Not tested  STAIRS: Not tested GAIT: Findings:  Gait pattern: WFL Distance walked: various clinic distances Assistive device utilized: Single point cane Level of assistance: Modified independence Comments: WFL   FUNCTIONAL TESTS:   30 sec sit to stand: 7 stands, no UE   SpO2 98% at rest, 96% after Heart rate 103 at rest, 118 after                                                                                                                              TREATMENT:  Self-Care/Home Management: Vitals:   06/21/24 1549  BP: 91/61  Pulse: 60   BP reading obtained in LUE in sitting at rest. BP still low but improved  as compared to previous sessions.   TherAct Gait on treadmill at 1.1 mph x 15 min with no incline. Pt able to increase time on treadmill this date as well as speed. Pt does start to have some R hip soreness following exercise. RPE 8/10 following activity.  To work on dynamic balance and hip strengthening: Resisted lateral sidestepping 3 x 10 ft L/R with red TB Resisted monster walks 6 x 10 ft with red TB   PATIENT EDUCATION: Education details: continue HEP Person educated: Patient Education method: Medical illustrator Education comprehension: verbalized understanding, returned demonstration, and needs further education  HOME EXERCISE PROGRAM: To be reviewed/updated from previous POC: [email or web address]   Access Code: [email or web address] URL: [email or web address] Date: 05/30/2024 Prepared by: Waddell Southgate  Exercises - Sidelying Open Book Thoracic Lumbar Rotation and Extension  - 1 x daily - 7 x weekly - 1-2 sets - 10 reps - Supine Thoracic Mobilization Foam Roll Horizontal with Arm Stretch  - 1 x daily - 7 x weekly - 1-2 sets - 10 reps - Child's Pose with Sidebending  - 1 x daily - 7 x weekly - 1 sets - 3-5 reps - 30 sec hold - Child's Pose with Thread the Needle  - 1 x daily - 7 x weekly - 1 sets - 3-5 reps - 30 sec hold - Alternating Step Taps with Counter Support  - 1 x daily - 7 x weekly - 3 sets - 10 reps - Supine Lower Trunk Rotation  - 1 x daily - 7 x weekly - 2-3 sets - 10 reps - Half Kneeling Hip Flexor Stretch  - 1 x daily - 7 x weekly - 1 sets - 3-5 reps - 30 sec hold - Heel Sits  - 1 x daily - 7 x weekly - 3 sets - 10 reps - Hooklying Single Knee to Chest Stretch  - 1 x daily - 7 x weekly - 1 sets - 3-5 reps - 30 sec hold - Seated Knee Extension with Resistance  - 1 x daily - 7 x weekly - 3 sets - 10 reps - Seated March with Resistance  - 1 x daily - 7 x weekly - 3 sets - 10 reps  GOALS: Goals reviewed with patient? Yes  SHORT TERM GOALS: Target date: 06/16/2024   Pt will be independent with initial HEP for improved strength and endurance. Baseline: Goal status: MET  2.  Pt will increase her 30 sec sit to stands to 9 reps for improved endurance. Baseline: 7 reps (6/12), 6 reps (7/1) Goal status: NOT MET  3.  Pt will improve gait velocity to at least 2.5 ft/sec for improved gait efficiency and performance at mod I level  Baseline: 2.19 ft/sec no AD (6/12), 2.6 ft/sec no AD (7/1) Goal status: MET  4.  Pt will ambulate greater than or equal to 750 feet on with LRAD and mod I for improved cardiovascular endurance and BLE strength.  Baseline: 635 ft with SPC mod I (6/12), 439 ft with SPC mod I (7/1) Goal status: NOT MET   LONG TERM GOALS: Target date: 07/07/2024   Pt will be independent with final HEP for improved strength and endurance. Baseline:  Goal  status: INITIAL  2.  Pt will increase her 30 sec sit to stands to 9 reps for improved endurance. Baseline: 7 reps (6/12), 6 reps (7/1) Goal status: REVISED/DOWNGRADED  3.  Pt will improve gait velocity to at least 2.75 ft/sec for improved gait  efficiency and performance at mod I level  Baseline: 2.19 ft/sec no AD (6/12), 2.6 ft/sec no AD (7/1) Goal status: INITIAL  4.  Pt will ambulate greater than or equal to 700 feet on with LRAD and mod I for improved cardiovascular endurance and BLE strength.  Baseline: 635 ft with SPC mod I (6/12), 439 ft with SPC mod I (7/1) Goal status: REVISED/DOWNGRADED   ASSESSMENT:  CLINICAL IMPRESSION: Emphasis of skilled PT session on continuing to assess BP, working on gait training on treadmill, and working on dynamic balance and hip strengthening. Pt with improved BP this date though still low as compared to normal values. She also exhibits improved tolerance for gait training on treadmill with increased speed and increased time spent walking this session though she does have a higher RPE rating as compared to previous session on treadmill. Overall she is improved as compared to previous session since she has been taking iron  pills with improved energy levels and improved BP. She continues to benefit from skilled PT services to work towards improving her endurance and independence with functional mobility. Continue POC.    OBJECTIVE IMPAIRMENTS: cardiopulmonary status limiting activity, decreased activity tolerance, decreased endurance, decreased mobility, difficulty walking, decreased strength, impaired perceived functional ability, and pain.   ACTIVITY LIMITATIONS: carrying, lifting, bending, standing, squatting, and stairs  PARTICIPATION LIMITATIONS: community activity and occupation  PERSONAL FACTORS: Time since onset of injury/illness/exacerbation and 3+ comorbidities:  long-Covid, positive ANA, arthralgia, anemia, B12 deficiency, mixed  hyperlipidemia, prediabetesare also affecting patient's functional outcome.   REHAB POTENTIAL: Good  CLINICAL DECISION MAKING: Stable/uncomplicated  EVALUATION COMPLEXITY: Low  PLAN:  PT FREQUENCY: 2x/week  PT DURATION: 6 weeks  PLANNED INTERVENTIONS: 97164- PT Re-evaluation, 97750- Physical Performance Testing, 97110-Therapeutic exercises, 97530- Therapeutic activity, [id number]- Neuromuscular re-education, 97535- Self Care, 02859- Manual therapy, [id number]- Gait training, [phone]- Canalith repositioning, [id number]- Aquatic Therapy, [id number]- Electrical stimulation (manual), [phone] (1-2 muscles), 20561 (3+ muscles)- Dry Needling, Patient/Family education, Balance training, Stair training, Taping, Joint mobilization, Spinal manipulation, Vestibular training, DME instructions, Cryotherapy, and Moist heat  PLAN FOR NEXT SESSION: monitor vitals and response to exercise, check BP, review prior HEP and addend as needed; SciFit vs elliptical vs treadmill, endurance training, LE functional strengthening (resisted sit to stands, half kneel)   Waddell Southgate, PT Waddell Southgate, PT, DPT, CSRS  06/21/2024, 4:18 PM  For all possible CPT codes, reference the Planned Interventions line above.     Check all conditions that are expected to impact treatment: {Conditions expected to impact treatment:Presence of Medical Equipment   If treatment provided at initial evaluation, no treatment charged due to lack of authorization.

## 2024-06-24 ENCOUNTER — Ambulatory Visit: Admitting: Physical Therapy

## 2024-06-24 VITALS — BP 98/68 | HR 63

## 2024-06-24 DIAGNOSIS — M6281 Muscle weakness (generalized): Secondary | ICD-10-CM

## 2024-06-24 DIAGNOSIS — R5381 Other malaise: Secondary | ICD-10-CM

## 2024-06-24 DIAGNOSIS — R2689 Other abnormalities of gait and mobility: Secondary | ICD-10-CM

## 2024-06-24 NOTE — Therapy (Signed)
 OUTPATIENT PHYSICAL THERAPY NEURO TREATMENT   Patient Name: Kelli Cooper MRN: [id number] DOB:01-15-1981, 43 y.o., female Today's Date: 06/24/2024   PCP: Oley Bascom RAMAN, NP REFERRING PROVIDER: Oley Bascom RAMAN, NP  END OF SESSION:  PT End of Session - 06/24/24 1535     Visit Number 8    Number of Visits 13   with eval   Date for PT Re-Evaluation 07/21/24    Authorization Type Medicaid [employer]    Authorization Time Period 4 visits approved 7/8-8/6    Authorization - Visit Number 2    Authorization - Number of Visits 4    Progress Note Due on Visit 10    PT Start Time 1532    PT Stop Time 1614    PT Time Calculation (min) 42 min    Equipment Utilized During Treatment Gait belt    Activity Tolerance Patient tolerated treatment well    Behavior During Therapy WFL for tasks assessed/performed                Past Medical History:  Diagnosis Date   Anemia    Arthralgia    COVID    DM (diabetes mellitus) (HCC)    Iron  deficiency anemia    Muscle pain    Palpitations    Positive ANA (antinuclear antibody)    SOB (shortness of breath)    Past Surgical History:  Procedure Laterality Date   NO PAST SURGERIES     Patient Active Problem List   Diagnosis Date Noted   Low vitamin D  level 04/05/2024   Fibroids 04/04/2024   Heavy menstrual bleeding 02/01/2024   Iron  deficiency anemia 09/17/2023   Positive ANA (antinuclear antibody) 08/06/2023   Muscle pain 07/30/2023    ONSET DATE: 05/11/2024 (referral date)  REFERRING DIAG: R53.81 (ICD-10-CM) - Physical deconditioning  THERAPY DIAG:  Physical deconditioning  Other abnormalities of gait and mobility  Muscle weakness (generalized)  Rationale for Evaluation and Treatment: Rehabilitation  SUBJECTIVE:                                                                                                                                                                                             SUBJECTIVE  STATEMENT:  [name]  Pt reports she is feeling sleepy and tired today, in a daze but that is normal for the past few years. She has been getting lots of cleaning done at home getting ready for renovators to come on Monday. Pt has been able to get in 2 short walks and her stretches every day   Pt accompanied by: self  PERTINENT HISTORY: PMH: long-Covid, positive ANA,  arthralgia, anemia, B12 deficiency, mixed hyperlipidemia, prediabetes  PAIN:  Are you having pain? No  PRECAUTIONS: None  RED FLAGS: None   WEIGHT BEARING RESTRICTIONS: No  FALLS: Has patient fallen in last 6 months? No  LIVING ENVIRONMENT: Lives with: lives alone (with her dog) Lives in: House/apartment Stairs: Yes: Internal: 12 steps; on right going up Has following equipment at home: Single point cane  PLOF: Independent with gait, Independent with transfers, and Requires assistive device for independence  PATIENT GOALS: to be able to walk without a cane  OBJECTIVE:  Note: Objective measures were completed at Evaluation unless otherwise noted.  DIAGNOSTIC FINDINGS: None relevant to this POC  COGNITION: Overall cognitive status: Within functional limits for tasks assessed   SENSATION: WFL, no N/T  COORDINATION: WFL  EDEMA:  None per patient report  POSTURE: rounded shoulders and forward head  LOWER EXTREMITY ROM:  WFL   Active  Right Eval Left Eval  Hip flexion    Hip extension    Hip abduction    Hip adduction    Hip internal rotation    Hip external rotation    Knee flexion    Knee extension    Ankle dorsiflexion    Ankle plantarflexion    Ankle inversion    Ankle eversion     (Blank rows = not tested)  LOWER EXTREMITY MMT:    MMT Right Eval Left Eval  Hip flexion 4 4  Hip extension    Hip abduction    Hip adduction    Hip internal rotation    Hip external rotation    Knee flexion 4 4  Knee extension 4 4  Ankle dorsiflexion 4 4  Ankle plantarflexion    Ankle  inversion    Ankle eversion    (Blank rows = not tested)  BED MOBILITY:  Mod I per patient report  TRANSFERS: Sit to stand: Modified independence  Assistive device utilized: None     Stand to sit: Modified independence  Assistive device utilized: None     Chair to chair: Modified independence  Assistive device utilized: None       RAMP:  Not tested  CURB:  Not tested  STAIRS: Not tested GAIT: Findings:  Gait pattern: WFL Distance walked: various clinic distances Assistive device utilized: Single point cane Level of assistance: Modified independence Comments: WFL   FUNCTIONAL TESTS:   30 sec sit to stand: 7 stands, no UE   SpO2 98% at rest, 96% after Heart rate 103 at rest, 118 after                                                                                                                              TREATMENT:  Self-Care/Home Management: Vitals:   06/24/24 1544  BP: 98/68  Pulse: 63   BP reading obtained in LUE in sitting at rest. BP still low but improved as compared to previous sessions.   TherAct  Gait on treadmill at 1.1 mph x 8 min with 3% incline. Pt able to progress to adding in incline on treadmill this date. RPE 8/10 following activity. Pt does get a labored breathing sensation in her chest on treadmill, does not resolve at rest. However, pt does feel this sensation after her walks at home and it typically improves after sitting and resting for a while.   NMR In half kneeling on floor to work on proximal stability: L/R half kneel performing 2# weighted dowel rod volleyball 2 x 15 reps B L/R half kneel with 6# weighted dowel: Chest press 2 x 15 reps B OH lift x 10 reps B   PATIENT EDUCATION: Education details: continue HEP Person educated: Patient Education method: Medical illustrator Education comprehension: verbalized understanding, returned demonstration, and needs further education  HOME EXERCISE PROGRAM: To  be reviewed/updated from previous POC: [email or web address]   Access Code: [email or web address] URL: [email or web address] Date: 05/30/2024 Prepared by: Waddell Southgate  Exercises - Sidelying Open Book Thoracic Lumbar Rotation and Extension  - 1 x daily - 7 x weekly - 1-2 sets - 10 reps - Supine Thoracic Mobilization Foam Roll Horizontal with Arm Stretch  - 1 x daily - 7 x weekly - 1-2 sets - 10 reps - Child's Pose with Sidebending  - 1 x daily - 7 x weekly - 1 sets - 3-5 reps - 30 sec hold - Child's Pose with Thread the Needle  - 1 x daily - 7 x weekly - 1 sets - 3-5 reps - 30 sec hold - Alternating Step Taps with Counter Support  - 1 x daily - 7 x weekly - 3 sets - 10 reps - Supine Lower Trunk Rotation  - 1 x daily - 7 x weekly - 2-3 sets - 10 reps - Half Kneeling Hip Flexor Stretch  - 1 x daily - 7 x weekly - 1 sets - 3-5 reps - 30 sec hold - Heel Sits  - 1 x daily - 7 x weekly - 3 sets - 10 reps - Hooklying Single Knee to Chest Stretch  - 1 x daily - 7 x weekly - 1 sets - 3-5 reps - 30 sec hold - Seated Knee Extension with Resistance  - 1 x daily - 7 x weekly - 3 sets - 10 reps - Seated March with Resistance  - 1 x daily - 7 x weekly - 3 sets - 10 reps  GOALS: Goals reviewed with patient? Yes  SHORT TERM GOALS: Target date: 06/16/2024   Pt will be independent with initial HEP for improved strength and endurance. Baseline: Goal status: MET  2.  Pt will increase her 30 sec sit to stands to 9 reps for improved endurance. Baseline: 7 reps (6/12), 6 reps (7/1) Goal status: NOT MET  3.  Pt will improve gait velocity to at least 2.5 ft/sec for improved gait efficiency and performance at mod I level  Baseline: 2.19 ft/sec no AD (6/12), 2.6 ft/sec no AD (7/1) Goal status: MET  4.  Pt will ambulate greater than or equal to 750 feet on with LRAD and mod I for improved cardiovascular endurance and BLE strength.  Baseline: 635 ft with SPC mod I (6/12), 439 ft with SPC mod I (7/1) Goal status:  NOT MET   LONG TERM GOALS: Target date: 07/07/2024   Pt will be independent with final HEP for improved strength and endurance. Baseline:  Goal status: INITIAL  2.  Pt will increase her 30  sec sit to stands to 9 reps for improved endurance. Baseline: 7 reps (6/12), 6 reps (7/1) Goal status: REVISED/DOWNGRADED  3.  Pt will improve gait velocity to at least 2.75 ft/sec for improved gait efficiency and performance at mod I level  Baseline: 2.19 ft/sec no AD (6/12), 2.6 ft/sec no AD (7/1) Goal status: INITIAL  4.  Pt will ambulate greater than or equal to 700 feet on with LRAD and mod I for improved cardiovascular endurance and BLE strength.  Baseline: 635 ft with SPC mod I (6/12), 439 ft with SPC mod I (7/1) Goal status: REVISED/DOWNGRADED   ASSESSMENT:  CLINICAL IMPRESSION: Emphasis of skilled PT session on continuing to assess BP, working on gait training on treadmill, and working on functional hip strengthening and proximal stability. Pt with improved BP this date though still low as compared to normal values. She also exhibits improved tolerance for gait training on treadmill with addition of incline this date. She continues to benefit from skilled PT services to work towards improving her endurance and independence with functional mobility. Continue POC.    OBJECTIVE IMPAIRMENTS: cardiopulmonary status limiting activity, decreased activity tolerance, decreased endurance, decreased mobility, difficulty walking, decreased strength, impaired perceived functional ability, and pain.   ACTIVITY LIMITATIONS: carrying, lifting, bending, standing, squatting, and stairs  PARTICIPATION LIMITATIONS: community activity and occupation  PERSONAL FACTORS: Time since onset of injury/illness/exacerbation and 3+ comorbidities:  long-Covid, positive ANA, arthralgia, anemia, B12 deficiency, mixed hyperlipidemia, prediabetesare also affecting patient's functional outcome.   REHAB POTENTIAL:  Good  CLINICAL DECISION MAKING: Stable/uncomplicated  EVALUATION COMPLEXITY: Low  PLAN:  PT FREQUENCY: 2x/week  PT DURATION: 6 weeks  PLANNED INTERVENTIONS: 97164- PT Re-evaluation, 97750- Physical Performance Testing, 97110-Therapeutic exercises, 97530- Therapeutic activity, [id number]- Neuromuscular re-education, 97535- Self Care, [id number]- Manual therapy, [id number]- Gait training, [phone]- Canalith repositioning, [id number]- Aquatic Therapy, [phone]- Electrical stimulation (manual), [phone] (1-2 muscles), [id number] (3+ muscles)- Dry Needling, Patient/Family education, Balance training, Stair training, Taping, Joint mobilization, Spinal manipulation, Vestibular training, DME instructions, Cryotherapy, and Moist heat  PLAN FOR NEXT SESSION: monitor vitals and response to exercise, check BP, review prior HEP and addend as needed; SciFit vs elliptical vs treadmill, endurance training, LE functional strengthening (resisted sit to stands, half kneel), resisted gait, obstacle course   Waddell Southgate, PT Waddell Southgate, PT, DPT, CSRS  06/24/2024, 4:19 PM  For all possible CPT codes, reference the Planned Interventions line above.     Check all conditions that are expected to impact treatment: {Conditions expected to impact treatment:Presence of Medical Equipment   If treatment provided at initial evaluation, no treatment charged due to lack of authorization.

## 2024-06-27 ENCOUNTER — Ambulatory Visit: Admitting: Physical Therapy

## 2024-06-27 VITALS — BP 131/81 | HR 77

## 2024-06-27 DIAGNOSIS — R2689 Other abnormalities of gait and mobility: Secondary | ICD-10-CM

## 2024-06-27 DIAGNOSIS — R5381 Other malaise: Secondary | ICD-10-CM

## 2024-06-27 DIAGNOSIS — M6281 Muscle weakness (generalized): Secondary | ICD-10-CM

## 2024-06-27 NOTE — Therapy (Signed)
 OUTPATIENT PHYSICAL THERAPY NEURO TREATMENT   Patient Name: Kelli Cooper MRN: [id number] DOB:[birth year], 43 y.o., female Today's Date: 06/27/2024   PCP: Oley Bascom RAMAN, NP REFERRING PROVIDER: Oley Bascom RAMAN, NP  END OF SESSION:  PT End of Session - 06/27/24 1500     Visit Number 9    Number of Visits 13   with eval   Date for PT Re-Evaluation 07/21/24    Authorization Type Medicaid [employer]    Authorization Time Period 4 visits approved 7/8-8/6    Authorization - Number of Visits 4    Progress Note Due on Visit 10    PT Start Time 1500    PT Stop Time 1548    PT Time Calculation (min) 48 min    Equipment Utilized During Treatment Gait belt    Activity Tolerance Patient tolerated treatment well    Behavior During Therapy WFL for tasks assessed/performed                 Past Medical History:  Diagnosis Date   Anemia    Arthralgia    COVID    DM (diabetes mellitus) (HCC)    Iron  deficiency anemia    Muscle pain    Palpitations    Positive ANA (antinuclear antibody)    SOB (shortness of breath)    Past Surgical History:  Procedure Laterality Date   NO PAST SURGERIES     Patient Active Problem List   Diagnosis Date Noted   Low vitamin D  level 04/05/2024   Fibroids 04/04/2024   Heavy menstrual bleeding 02/01/2024   Iron  deficiency anemia 09/17/2023   Positive ANA (antinuclear antibody) 08/06/2023   Muscle pain 07/30/2023    ONSET DATE: 05/11/2024 (referral date)  REFERRING DIAG: R53.81 (ICD-10-CM) - Physical deconditioning  THERAPY DIAG:  Physical deconditioning  Other abnormalities of gait and mobility  Muscle weakness (generalized)  Rationale for Evaluation and Treatment: Rehabilitation  SUBJECTIVE:                                                                                                                                                                                             SUBJECTIVE STATEMENT:  Kelli Cooper  Pt  reports she is feeling better today than last visit. She was able to move some light furniture this morning, feeling well-rested this morning. No unusual pain today.   Pt accompanied by: self  PERTINENT HISTORY: PMH: long-Covid, positive ANA, arthralgia, anemia, B12 deficiency, mixed hyperlipidemia, prediabetes  PAIN:  Are you having pain? No  PRECAUTIONS: None  RED FLAGS: None   WEIGHT BEARING RESTRICTIONS: No  FALLS: Has patient fallen  in last 6 months? No  LIVING ENVIRONMENT: Lives with: lives alone (with her dog) Lives in: House/apartment Stairs: Yes: Internal: 12 steps; on right going up Has following equipment at home: Single point cane  PLOF: Independent with gait, Independent with transfers, and Requires assistive device for independence  PATIENT GOALS: to be able to walk without a cane  OBJECTIVE:  Note: Objective measures were completed at Evaluation unless otherwise noted.  DIAGNOSTIC FINDINGS: None relevant to this POC  COGNITION: Overall cognitive status: Within functional limits for tasks assessed   SENSATION: WFL, no N/T  COORDINATION: WFL  EDEMA:  None per patient report  POSTURE: rounded shoulders and forward head  LOWER EXTREMITY ROM:  WFL   Active  Right Eval Left Eval  Hip flexion    Hip extension    Hip abduction    Hip adduction    Hip internal rotation    Hip external rotation    Knee flexion    Knee extension    Ankle dorsiflexion    Ankle plantarflexion    Ankle inversion    Ankle eversion     (Blank rows = not tested)  LOWER EXTREMITY MMT:    MMT Right Eval Left Eval  Hip flexion 4 4  Hip extension    Hip abduction    Hip adduction    Hip internal rotation    Hip external rotation    Knee flexion 4 4  Knee extension 4 4  Ankle dorsiflexion 4 4  Ankle plantarflexion    Ankle inversion    Ankle eversion    (Blank rows = not tested)  BED MOBILITY:  Mod I per patient report  TRANSFERS: Sit to stand:  Modified independence  Assistive device utilized: None     Stand to sit: Modified independence  Assistive device utilized: None     Chair to chair: Modified independence  Assistive device utilized: None       RAMP:  Not tested  CURB:  Not tested  STAIRS: Not tested GAIT: Findings:  Gait pattern: WFL Distance walked: various clinic distances Assistive device utilized: Single point cane Level of assistance: Modified independence Comments: WFL   FUNCTIONAL TESTS:   30 sec sit to stand: 7 stands, no UE   SpO2 98% at rest, 96% after Heart rate 103 at rest, 118 after                                                                                                                              TREATMENT:  Self-Care/Home Management: Vitals:   06/27/24 1505  BP: 131/81  Pulse: 77   BP reading obtained in LUE in sitting at rest. Vitals WNL this session.   TherAct Gait on treadmill at 1.1 mph x 8 min with 4% incline. Pt able to progress to adding in incline on treadmill this date. RPE 7/10 following activity. Pt does get a labored breathing sensation that improves with seated  rest break.  To work on functional strengthening: Resisted gait with black resistance band: 2 x 115 ft each direction around track with steady resistance initially with progression to multidirectional perturbations Pt initially utilizing step over strategy for balance recovery, progresses to widening her stance and use of stepping strategy for balance recovery  5 Blaze pods on random setting for improved dynamic balance and endurance training.  Performed on 1 minute intervals with 30 rest periods.  Pt requires SBA guarding. Pods placed in a line laterally on the floor while patient sidestepping to tap them. Round 1:  24 hits. Round 2:  21 hits. Round 3:  20 hits. Pods placed laterally in a line on the floor, pt stepping on compliant surface and sidestepping over 1 foam blocks: Round 1:  16  hits. Round 2:  17 hits. Round 3:  18 hits. 8-9/10 RPE following Blaze pods activity, but a happy intensity    PATIENT EDUCATION: Education details: continue HEP, monitor recovery/response from this session Person educated: Patient Education method: Medical illustrator Education comprehension: verbalized understanding, returned demonstration, and needs further education  HOME EXERCISE PROGRAM: To be reviewed/updated from previous POC: [email or web address]   Access Code: [email or web address] URL: [email or web address] Date: 05/30/2024 Prepared by: Waddell Southgate  Exercises - Sidelying Open Book Thoracic Lumbar Rotation and Extension  - 1 x daily - 7 x weekly - 1-2 sets - 10 reps - Supine Thoracic Mobilization Foam Roll Horizontal with Arm Stretch  - 1 x daily - 7 x weekly - 1-2 sets - 10 reps - Child's Pose with Sidebending  - 1 x daily - 7 x weekly - 1 sets - 3-5 reps - 30 sec hold - Child's Pose with Thread the Needle  - 1 x daily - 7 x weekly - 1 sets - 3-5 reps - 30 sec hold - Alternating Step Taps with Counter Support  - 1 x daily - 7 x weekly - 3 sets - 10 reps - Supine Lower Trunk Rotation  - 1 x daily - 7 x weekly - 2-3 sets - 10 reps - Half Kneeling Hip Flexor Stretch  - 1 x daily - 7 x weekly - 1 sets - 3-5 reps - 30 sec hold - Heel Sits  - 1 x daily - 7 x weekly - 3 sets - 10 reps - Hooklying Single Knee to Chest Stretch  - 1 x daily - 7 x weekly - 1 sets - 3-5 reps - 30 sec hold - Seated Knee Extension with Resistance  - 1 x daily - 7 x weekly - 3 sets - 10 reps - Seated March with Resistance  - 1 x daily - 7 x weekly - 3 sets - 10 reps  GOALS: Goals reviewed with patient? Yes  SHORT TERM GOALS: Target date: 06/16/2024   Pt will be independent with initial HEP for improved strength and endurance. Baseline: Goal status: MET  2.  Pt will increase her 30 sec sit to stands to 9 reps for improved endurance. Baseline: 7 reps (6/12), 6 reps (7/1) Goal status: NOT  MET  3.  Pt will improve gait velocity to at least 2.5 ft/sec for improved gait efficiency and performance at mod I level  Baseline: 2.19 ft/sec no AD (6/12), 2.6 ft/sec no AD (7/1) Goal status: MET  4.  Pt will ambulate greater than or equal to 750 feet on with LRAD and mod I for improved cardiovascular endurance and BLE strength.  Baseline: 635 ft with [facility]  mod I (6/12), 439 ft with SPC mod I (7/1) Goal status: NOT MET   LONG TERM GOALS: Target date: 07/07/2024   Pt will be independent with final HEP for improved strength and endurance. Baseline:  Goal status: INITIAL  2.  Pt will increase her 30 sec sit to stands to 9 reps for improved endurance. Baseline: 7 reps (6/12), 6 reps (7/1) Goal status: REVISED/DOWNGRADED  3.  Pt will improve gait velocity to at least 2.75 ft/sec for improved gait efficiency and performance at mod I level  Baseline: 2.19 ft/sec no AD (6/12), 2.6 ft/sec no AD (7/1) Goal status: INITIAL  4.  Pt will ambulate greater than or equal to 700 feet on with LRAD and mod I for improved cardiovascular endurance and BLE strength.  Baseline: 635 ft with SPC mod I (6/12), 439 ft with SPC mod I (7/1) Goal status: REVISED/DOWNGRADED   ASSESSMENT:  CLINICAL IMPRESSION: Emphasis of skilled PT session on continuing to assess BP, working on gait training on treadmill, and working on balance reactions, dynamic balance, and endurance training. Pt's BP WNL this date, significant improvement as compared to previous few PT sessions. She also exhibits decreased RPE with increase in incline on treadmill this date. Pt is fatigued by Blaze pods activity but overall does well pushing herself with this. She continues to benefit from skilled PT services to work towards improving her endurance and independence with functional mobility. Continue POC.    OBJECTIVE IMPAIRMENTS: cardiopulmonary status limiting activity, decreased activity tolerance, decreased endurance,  decreased mobility, difficulty walking, decreased strength, impaired perceived functional ability, and pain.   ACTIVITY LIMITATIONS: carrying, lifting, bending, standing, squatting, and stairs  PARTICIPATION LIMITATIONS: community activity and occupation  PERSONAL FACTORS: Time since onset of injury/illness/exacerbation and 3+ comorbidities:  long-Covid, positive ANA, arthralgia, anemia, B12 deficiency, mixed hyperlipidemia, prediabetesare also affecting patient's functional outcome.   REHAB POTENTIAL: Good  CLINICAL DECISION MAKING: Stable/uncomplicated  EVALUATION COMPLEXITY: Low  PLAN:  PT FREQUENCY: 2x/week  PT DURATION: 6 weeks  PLANNED INTERVENTIONS: 97164- PT Re-evaluation, 97750- Physical Performance Testing, 97110-Therapeutic exercises, 97530- Therapeutic activity, [id number]- Neuromuscular re-education, 97535- Self Care, 02859- Manual therapy, [id number]- Gait training, [phone]- Canalith repositioning, [id number]- Aquatic Therapy, [phone]- Electrical stimulation (manual), [phone] (1-2 muscles), [id number] (3+ muscles)- Dry Needling, Patient/Family education, Balance training, Stair training, Taping, Joint mobilization, Spinal manipulation, Vestibular training, DME instructions, Cryotherapy, and Moist heat  PLAN FOR NEXT SESSION: 10th PN, monitor vitals and response to exercise, check BP, review prior HEP and addend as needed; SciFit vs elliptical vs treadmill (with incline), endurance training, LE functional strengthening (resisted sit to stands, half kneel), resisted gait, obstacle course   Waddell Southgate, PT Waddell Southgate, PT, DPT, CSRS  06/27/2024, 3:49 PM  For all possible CPT codes, reference the Planned Interventions line above.     Check all conditions that are expected to impact treatment: {Conditions expected to impact treatment:Presence of Medical Equipment   If treatment provided at initial evaluation, no treatment charged due to lack of authorization.

## 2024-06-28 ENCOUNTER — Ambulatory Visit (HOSPITAL_BASED_OUTPATIENT_CLINIC_OR_DEPARTMENT_OTHER): Admitting: Obstetrics & Gynecology

## 2024-06-28 VITALS — BP 130/87 | HR 92 | Wt 223.0 lb

## 2024-06-28 DIAGNOSIS — D500 Iron deficiency anemia secondary to blood loss (chronic): Secondary | ICD-10-CM | POA: Diagnosis not present

## 2024-06-28 DIAGNOSIS — E559 Vitamin D deficiency, unspecified: Secondary | ICD-10-CM | POA: Diagnosis not present

## 2024-06-28 DIAGNOSIS — N920 Excessive and frequent menstruation with regular cycle: Secondary | ICD-10-CM | POA: Diagnosis not present

## 2024-06-28 DIAGNOSIS — D219 Benign neoplasm of connective and other soft tissue, unspecified: Secondary | ICD-10-CM | POA: Diagnosis not present

## 2024-06-28 NOTE — Progress Notes (Signed)
 GYNECOLOGY  VISIT  CC:   bleeding/fibroids  HPI: 43 y.o. [id number] Single White or Caucasian female here for discussion of bleeding and fibroids.  H/o worsening menstrual cycles.  Cycles are regular but heavy with significant camping.  Has undergone ultrasound showing uterus measuring 12.6 x 10.8 x 9.2cm with 8.2cm fibroid and additional 2.8cm and 4.6cm fibroid.  Also, left ovary did contain three simple appearing cysts with largest measuring 5.1cm.  She has been micronor  since April without a lot of change in bleeding.  She has received IV iron  with most recent infusions in June.  She is also taking oral iron .  Last hb was 06/10/2024 and was 10.4.  Ready to discuss more definitive treatment options.  Not interested in child bearing.    Pt also has fatigue from long Covid.  Uses walking stick.  Difficult to know how much of her symptoms are related to anemia and long Covid as well but likely are contributing some.     Last pap smear 04/04/2024.     Past Medical History:  Diagnosis Date   Anemia    Arthralgia    COVID    DM (diabetes mellitus) (HCC)    Iron  deficiency anemia    Muscle pain    Palpitations    Positive ANA (antinuclear antibody)    SOB (shortness of breath)     MEDS:   Current Outpatient Medications on File Prior to Visit  Medication Sig Dispense Refill   cyanocobalamin  (VITAMIN B12) 1000 MCG tablet Take 1 tablet (1,000 mcg total) by mouth daily. 30 tablet 2   ibuprofen  (ADVIL ) 200 MG tablet Take 200 mg by mouth every 6 (six) hours as needed.     Iron , Ferrous Sulfate , 325 (65 Fe) MG TABS Take 325 mg by mouth daily. 30 tablet 1   norethindrone  (MICRONOR ) 0.35 MG tablet Take 1 tablet (0.35 mg total) by mouth daily. 30 tablet 12   Vitamin D , Ergocalciferol , (DRISDOL ) 1.25 MG (50000 UNIT) CAPS capsule Take 1 capsule (50,000 Units total) by mouth every 7 (seven) days. (Patient not taking: Reported on 06/28/2024) 5 capsule 2   No current facility-administered medications on  file prior to visit.    ALLERGIES: Patient has no known allergies.  SH:  single, non smoker  Review of Systems  Constitutional: Negative.   Genitourinary:        Heavy menstrual cycles    PHYSICAL EXAMINATION:    BP 130/87 (BP Location: Right Arm, Patient Position: Sitting, Cuff Size: Large)   Pulse 92   Wt 223 lb (101.2 kg)   SpO2 100%   BMI 39.50 kg/m     General appearance: alert, cooperative and appears stated age  Abdomen: soft, non-tender; bowel sounds normal; no masses,  no organomegaly Lymph:  no inguinal LAD noted  Pelvic: External genitalia:  no lesions              Urethra:  normal appearing urethra with no masses, tenderness or lesions              Bartholins and Skenes: normal                 Vagina: normal mucosa without prolapse or lesions              Cervix: no lesions              Bimanual Exam:  Uterus:  enlarged, 16 weeks size, more fullness on right  Adnexa: no mass, fullness, tenderness  Chaperone was present for exam.  Assessment/Plan: 1. Menorrhagia with regular cycle (Primary) - will need endometrial biopsy with surgical planning.  Will return for this once more definitive surgical plan is made  2. Fibroids  3. Vitamin D  deficiency - VITAMIN D  25 Hydroxy (Vit-D Deficiency, Fractures)  4. Iron  deficiency anemia due to chronic blood loss - Iron  - Ferritin  - will recheck to see if additional iron  infusions are needed  Total time with pt:  37 minutes Documentation time: 8 minutes Total time:  45 minutes.

## 2024-06-29 ENCOUNTER — Ambulatory Visit (HOSPITAL_BASED_OUTPATIENT_CLINIC_OR_DEPARTMENT_OTHER): Payer: Self-pay | Admitting: Obstetrics & Gynecology

## 2024-06-29 ENCOUNTER — Other Ambulatory Visit (HOSPITAL_COMMUNITY): Payer: Self-pay

## 2024-06-29 DIAGNOSIS — E559 Vitamin D deficiency, unspecified: Secondary | ICD-10-CM

## 2024-06-29 LAB — FERRITIN: Ferritin: 43 ng/mL (ref 15–150)

## 2024-06-29 LAB — IRON: Iron: 167 ug/dL — ABNORMAL HIGH (ref 27–159)

## 2024-06-29 LAB — VITAMIN D 25 HYDROXY (VIT D DEFICIENCY, FRACTURES): Vit D, 25-Hydroxy: 18.4 ng/mL — ABNORMAL LOW (ref 30.0–100.0)

## 2024-06-29 MED ORDER — VITAMIN D (ERGOCALCIFEROL) 1.25 MG (50000 UNIT) PO CAPS
50000.0000 [IU] | ORAL_CAPSULE | ORAL | 0 refills | Status: DC
  Filled 2024-06-29 – 2024-08-23 (×2): qty 12, 84d supply, fill #0

## 2024-07-01 ENCOUNTER — Ambulatory Visit: Admitting: Physical Therapy

## 2024-07-01 VITALS — BP 98/72 | HR 84

## 2024-07-01 DIAGNOSIS — R5381 Other malaise: Secondary | ICD-10-CM

## 2024-07-01 DIAGNOSIS — M6281 Muscle weakness (generalized): Secondary | ICD-10-CM | POA: Diagnosis not present

## 2024-07-01 DIAGNOSIS — F4312 Post-traumatic stress disorder, chronic: Secondary | ICD-10-CM | POA: Diagnosis not present

## 2024-07-01 DIAGNOSIS — R2689 Other abnormalities of gait and mobility: Secondary | ICD-10-CM

## 2024-07-01 NOTE — Therapy (Signed)
 OUTPATIENT PHYSICAL THERAPY NEURO TREATMENT - 10th VISIT PROGRESS NOTE   Patient Name: Kelli Cooper MRN: [id number] DOB:10/18/1981, 43 y.o., female Today's Date: 07/01/2024   PCP: Kelli Bascom RAMAN, NP REFERRING PROVIDER: Oley Bascom RAMAN, NP  Physical Therapy Progress Note   Dates of Reporting Period: 05/26/2024 - 07/01/2024  See Note below for Objective Data and Assessment of Progress/Goals.  Thank you for the referral of this patient. Kelli Cooper, PT, DPT, CSRS   END OF SESSION:  PT End of Session - 07/01/24 1528     Visit Number 10    Number of Visits 13   with eval   Date for PT Re-Evaluation 07/21/24    Authorization Type Medicaid [employer]    Authorization Time Period 4 visits approved 7/8-8/6    Authorization - Number of Visits 4    Progress Note Due on Visit 10    PT Start Time 1526    PT Stop Time 1610    PT Time Calculation (min) 44 min    Equipment Utilized During Treatment Gait belt    Activity Tolerance Patient tolerated treatment well    Behavior During Therapy WFL for tasks assessed/performed                  Past Medical History:  Diagnosis Date   Anemia    Arthralgia    COVID    DM (diabetes mellitus) (HCC)    Iron  deficiency anemia    Muscle pain    Palpitations    Positive ANA (antinuclear antibody)    SOB (shortness of breath)    Past Surgical History:  Procedure Laterality Date   NO PAST SURGERIES     Patient Active Problem List   Diagnosis Date Noted   Low vitamin D  level 04/05/2024   Fibroids 04/04/2024   Heavy menstrual bleeding 02/01/2024   Iron  deficiency anemia 09/17/2023   Positive ANA (antinuclear antibody) 08/06/2023   Muscle pain 07/30/2023    ONSET DATE: 05/11/2024 (referral date)  REFERRING DIAG: R53.81 (ICD-10-CM) - Physical deconditioning  THERAPY DIAG:  Physical deconditioning  Other abnormalities of gait and mobility  Muscle weakness (generalized)  Rationale for Evaluation and  Treatment: Rehabilitation  SUBJECTIVE:                                                                                                                                                                                             SUBJECTIVE STATEMENT:  Kelli Cooper   Pt had a rough day yesterday (emotionally and physically) and couldn't even get out of bed. Today she is feeling very stiff and sore but an improvement  from yesterday. Pt is having ongoing stiffness/pain today.   Pt accompanied by: self  PERTINENT HISTORY: PMH: long-Covid, positive ANA, arthralgia, anemia, B12 deficiency, mixed hyperlipidemia, prediabetes  PAIN:  Are you having pain? No  PRECAUTIONS: None  RED FLAGS: None   WEIGHT BEARING RESTRICTIONS: No  FALLS: Has patient fallen in last 6 months? No  LIVING ENVIRONMENT: Lives with: lives alone (with her dog) Lives in: House/apartment Stairs: Yes: Internal: 12 steps; on right going up Has following equipment at home: Single point cane  PLOF: Independent with gait, Independent with transfers, and Requires assistive device for independence  PATIENT GOALS: to be able to walk without a cane  OBJECTIVE:  Note: Objective measures were completed at Evaluation unless otherwise noted.  DIAGNOSTIC FINDINGS: None relevant to this POC  COGNITION: Overall cognitive status: Within functional limits for tasks assessed   SENSATION: WFL, no N/T  COORDINATION: WFL  EDEMA:  None per patient report  POSTURE: rounded shoulders and forward head  LOWER EXTREMITY ROM:  WFL   Active  Right Eval Left Eval  Hip flexion    Hip extension    Hip abduction    Hip adduction    Hip internal rotation    Hip external rotation    Knee flexion    Knee extension    Ankle dorsiflexion    Ankle plantarflexion    Ankle inversion    Ankle eversion     (Blank rows = not tested)  LOWER EXTREMITY MMT:    MMT Right Eval Left Eval  Hip flexion 4 4  Hip extension    Hip  abduction    Hip adduction    Hip internal rotation    Hip external rotation    Knee flexion 4 4  Knee extension 4 4  Ankle dorsiflexion 4 4  Ankle plantarflexion    Ankle inversion    Ankle eversion    (Blank rows = not tested)  BED MOBILITY:  Mod I per patient report  TRANSFERS: Sit to stand: Modified independence  Assistive device utilized: None     Stand to sit: Modified independence  Assistive device utilized: None     Chair to chair: Modified independence  Assistive device utilized: None       RAMP:  Not tested  CURB:  Not tested  STAIRS: Not tested GAIT: Findings:  Gait pattern: WFL Distance walked: various clinic distances Assistive device utilized: Single point cane Level of assistance: Modified independence Comments: WFL                                                                                                                               TREATMENT:  Self-Care/Home Management: Vitals:   07/01/24 1535  BP: 98/72  Pulse: 84   BP reading obtained in LUE in sitting at rest. BP lower this session as compared to previous session but within safe limits for participation in PT session.  TherAct For goal assessment:  30 sec sit to stand: 8 reps : 687 ft, one standing rest break, RPE 9/10 Gait speed: 3.04 ft/sec no AD  SciFit multi-peaks level 3 for 8 minutes using BUE/BLEs for neural priming for reciprocal movement, dynamic cardiovascular warmup and increased amplitude of stepping. RPE of 6/10 following activity.       PATIENT EDUCATION: Education details: continue HEP, results of OM assessment and functional implications Person educated: Patient Education method: Explanation and Demonstration Education comprehension: verbalized understanding, returned demonstration, and needs further education  HOME EXERCISE PROGRAM: To be reviewed/updated from previous POC: [email or web address]   Access Code: [email or web address] URL:  [email or web address] Date: 05/30/2024 Prepared by: Kelli Cooper  Exercises - Sidelying Open Book Thoracic Lumbar Rotation and Extension  - 1 x daily - 7 x weekly - 1-2 sets - 10 reps - Supine Thoracic Mobilization Foam Roll Horizontal with Arm Stretch  - 1 x daily - 7 x weekly - 1-2 sets - 10 reps - Child's Pose with Sidebending  - 1 x daily - 7 x weekly - 1 sets - 3-5 reps - 30 sec hold - Child's Pose with Thread the Needle  - 1 x daily - 7 x weekly - 1 sets - 3-5 reps - 30 sec hold - Alternating Step Taps with Counter Support  - 1 x daily - 7 x weekly - 3 sets - 10 reps - Supine Lower Trunk Rotation  - 1 x daily - 7 x weekly - 2-3 sets - 10 reps - Half Kneeling Hip Flexor Stretch  - 1 x daily - 7 x weekly - 1 sets - 3-5 reps - 30 sec hold - Heel Sits  - 1 x daily - 7 x weekly - 3 sets - 10 reps - Hooklying Single Knee to Chest Stretch  - 1 x daily - 7 x weekly - 1 sets - 3-5 reps - 30 sec hold - Seated Knee Extension with Resistance  - 1 x daily - 7 x weekly - 3 sets - 10 reps - Seated March with Resistance  - 1 x daily - 7 x weekly - 3 sets - 10 reps  GOALS: Goals reviewed with patient? Yes  SHORT TERM GOALS: Target date: 06/16/2024   Pt will be independent with initial HEP for improved strength and endurance. Baseline: Goal status: MET  2.  Pt will increase her 30 sec sit to stands to 9 reps for improved endurance. Baseline: 7 reps (6/12), 6 reps (7/1) Goal status: NOT MET  3.  Pt will improve gait velocity to at least 2.5 ft/sec for improved gait efficiency and performance at mod I level  Baseline: 2.19 ft/sec no AD (6/12), 2.6 ft/sec no AD (7/1) Goal status: MET  4.  Pt will ambulate greater than or equal to 750 feet on with LRAD and mod I for improved cardiovascular endurance and BLE strength.  Baseline: 635 ft with SPC mod I (6/12), 439 ft with SPC mod I (7/1) Goal status: NOT MET   LONG TERM GOALS: Target date: 07/07/2024   Pt will be independent  with final HEP for improved strength and endurance. Baseline:  Goal status: INITIAL  2.  Pt will increase her 30 sec sit to stands to 9 reps for improved endurance. Baseline: 7 reps (6/12), 6 reps (7/1), 8 reps (7/18) Goal status: IN PROGRESS  3.  Pt will improve gait velocity to at least 2.75 ft/sec for improved gait efficiency and performance at mod I level  Baseline: 2.19 ft/sec no  AD (6/12), 2.6 ft/sec no AD (7/1), 3.04 ft/sec no AD (7/18) Goal status: MET  4.  Pt will ambulate greater than or equal to 700 feet on with LRAD and mod I for improved cardiovascular endurance and BLE strength.  Baseline: 635 ft with SPC mod I (6/12), 439 ft with SPC mod I (7/1), 687 ft with SPC mod I (7/18) Goal status: IN PROGRESS   ASSESSMENT:  CLINICAL IMPRESSION: Emphasis of skilled PT session on initiating assessment of LTG for 10th visit progress note. Pt has made progress towards 3/3 goals assessed this date and met her LTG for improving her gait speed. She exhibits improved endurance based on ability to increase # reps during 30 sec sit to stand, increasing her gait speed, and increasing her distance covered during the . Despite having a rough day yesterday and feeling sore and stiff today she showed improvement in all areas. She continues to benefit from skilled PT services to work towards improving her endurance and independence with functional mobility. Continue POC.    OBJECTIVE IMPAIRMENTS: cardiopulmonary status limiting activity, decreased activity tolerance, decreased endurance, decreased mobility, difficulty walking, decreased strength, impaired perceived functional ability, and pain.   ACTIVITY LIMITATIONS: carrying, lifting, bending, standing, squatting, and stairs  PARTICIPATION LIMITATIONS: community activity and occupation  PERSONAL FACTORS: Time since onset of injury/illness/exacerbation and 3+ comorbidities:  long-Covid, positive ANA, arthralgia, anemia, B12 deficiency,  mixed hyperlipidemia, prediabetesare also affecting patient's functional outcome.   REHAB POTENTIAL: Good  CLINICAL DECISION MAKING: Stable/uncomplicated  EVALUATION COMPLEXITY: Low  PLAN:  PT FREQUENCY: 2x/week  PT DURATION: 6 weeks  PLANNED INTERVENTIONS: 97164- PT Re-evaluation, 97750- Physical Performance Testing, 97110-Therapeutic exercises, 97530- Therapeutic activity, [id number]- Neuromuscular re-education, 97535- Self Care, [id number]- Manual therapy, [phone]- Gait training, [phone]- Canalith repositioning, [phone]- Aquatic Therapy, [phone]- Electrical stimulation (manual), [phone] (1-2 muscles), [phone] (3+ muscles)- Dry Needling, Patient/Family education, Balance training, Stair training, Taping, Joint mobilization, Spinal manipulation, Vestibular training, DME instructions, Cryotherapy, and Moist heat  PLAN FOR NEXT SESSION: add visits vs d/c next week, monitor vitals and response to exercise, check BP, review prior HEP and addend as needed; SciFit vs elliptical vs treadmill (with incline), endurance training, LE functional strengthening (resisted sit to stands, half kneel), resisted gait, obstacle course   Kelli Cooper, PT Kelli Cooper, PT, DPT, CSRS  07/01/2024, 4:12 PM  For all possible CPT codes, reference the Planned Interventions line above.     Check all conditions that are expected to impact treatment: {Conditions expected to impact treatment:Presence of Medical Equipment   If treatment provided at initial evaluation, no treatment charged due to lack of authorization.

## 2024-07-04 ENCOUNTER — Ambulatory Visit: Admitting: Physical Therapy

## 2024-07-04 VITALS — BP 123/73 | HR 62

## 2024-07-04 DIAGNOSIS — R2689 Other abnormalities of gait and mobility: Secondary | ICD-10-CM

## 2024-07-04 DIAGNOSIS — R5381 Other malaise: Secondary | ICD-10-CM | POA: Diagnosis not present

## 2024-07-04 DIAGNOSIS — M6281 Muscle weakness (generalized): Secondary | ICD-10-CM

## 2024-07-04 NOTE — Therapy (Signed)
 OUTPATIENT PHYSICAL THERAPY NEURO TREATMENT   Patient Name: Kelli Cooper MRN: [id number] DOB:10-15-1981, 43 y.o., female Today's Date: 07/04/2024   PCP: Oley Bascom RAMAN, NP REFERRING PROVIDER: Oley Bascom RAMAN, NP     END OF SESSION:  PT End of Session - 07/04/24 1533     Visit Number 11    Number of Visits 13   with eval   Date for PT Re-Evaluation 07/21/24    Authorization Type Medicaid [employer]    Authorization Time Period 4 visits approved 7/8-8/6    Authorization - Number of Visits 4    Progress Note Due on Visit 10    PT Start Time 1530    PT Stop Time 1615    PT Time Calculation (min) 45 min    Equipment Utilized During Treatment Gait belt    Activity Tolerance Patient tolerated treatment well    Behavior During Therapy WFL for tasks assessed/performed                   Past Medical History:  Diagnosis Date   Anemia    Arthralgia    COVID    DM (diabetes mellitus) (HCC)    Iron  deficiency anemia    Muscle pain    Palpitations    Positive ANA (antinuclear antibody)    SOB (shortness of breath)    Past Surgical History:  Procedure Laterality Date   NO PAST SURGERIES     Patient Active Problem List   Diagnosis Date Noted   Low vitamin D  level 04/05/2024   Fibroids 04/04/2024   Heavy menstrual bleeding 02/01/2024   Iron  deficiency anemia 09/17/2023   Positive ANA (antinuclear antibody) 08/06/2023   Muscle pain 07/30/2023    ONSET DATE: 05/11/2024 (referral date)  REFERRING DIAG: R53.81 (ICD-10-CM) - Physical deconditioning  THERAPY DIAG:  Physical deconditioning  Other abnormalities of gait and mobility  Muscle weakness (generalized)  Rationale for Evaluation and Treatment: Rehabilitation  SUBJECTIVE:                                                                                                                                                                                             SUBJECTIVE  STATEMENT:  Kelli Cooper  Pt continues to feel better each day since last Thursday. Yesterday she was able to do all her stretches and walk 2 blocks instead of 1 block. She does continue to have extreme ups and downs in terms of her function.  Pt accompanied by: self  PERTINENT HISTORY: PMH: long-Covid, positive ANA, arthralgia, anemia, B12 deficiency, mixed hyperlipidemia, prediabetes  PAIN:  Are you having pain? No  PRECAUTIONS: None  RED FLAGS: None   WEIGHT BEARING RESTRICTIONS: No  FALLS: Has patient fallen in last 6 months? No  LIVING ENVIRONMENT: Lives with: lives alone (with her dog) Lives in: House/apartment Stairs: Yes: Internal: 12 steps; on right going up Has following equipment at home: Single point cane  PLOF: Independent with gait, Independent with transfers, and Requires assistive device for independence  PATIENT GOALS: to be able to walk without a cane  OBJECTIVE:  Note: Objective measures were completed at Evaluation unless otherwise noted.  DIAGNOSTIC FINDINGS: None relevant to this POC  COGNITION: Overall cognitive status: Within functional limits for tasks assessed   SENSATION: WFL, no N/T  COORDINATION: WFL  EDEMA:  None per patient report  POSTURE: rounded shoulders and forward head  LOWER EXTREMITY ROM:  WFL   Active  Right Eval Left Eval  Hip flexion    Hip extension    Hip abduction    Hip adduction    Hip internal rotation    Hip external rotation    Knee flexion    Knee extension    Ankle dorsiflexion    Ankle plantarflexion    Ankle inversion    Ankle eversion     (Blank rows = not tested)  LOWER EXTREMITY MMT:    MMT Right Eval Left Eval  Hip flexion 4 4  Hip extension    Hip abduction    Hip adduction    Hip internal rotation    Hip external rotation    Knee flexion 4 4  Knee extension 4 4  Ankle dorsiflexion 4 4  Ankle plantarflexion    Ankle inversion    Ankle eversion    (Blank rows = not  tested)  BED MOBILITY:  Mod I per patient report  TRANSFERS: Sit to stand: Modified independence  Assistive device utilized: None     Stand to sit: Modified independence  Assistive device utilized: None     Chair to chair: Modified independence  Assistive device utilized: None       RAMP:  Not tested  CURB:  Not tested  STAIRS: Not tested GAIT: Findings:  Gait pattern: WFL Distance walked: various clinic distances Assistive device utilized: Single point cane Level of assistance: Modified independence Comments: WFL                                                                                                                               TREATMENT:  Self-Care/Home Management: Vitals:   07/04/24 1537  BP: 123/73  Pulse: 62   BP reading obtained in LUE in sitting at rest. BP within safe limits for participation in PT session and improved as compared to previous sessions.   TherAct SciFit multi-peaks level 4 for 8 minutes using BUE/BLEs for neural priming for reciprocal movement, dynamic cardiovascular warmup and increased amplitude of stepping. RPE of 6/10 following activity. ***  To work on dynamic balance, balance reactions, stepping strategies, and obstacle  navigation: Resisted gait with black resistance band: X 115 ft with steady resistance X 115 ft with multidirectional perturbations Obtacle course navigation: Navigating over 1 foam beams, across blue mat/compliant surface with alt L/R cone taps, and 6 step up/downs with no UE support Added in marches on airex Sidesteps across blue mat/compliant surface with alt L/R cone taps   RPE 9/10 by end of session        PATIENT EDUCATION: Education details: continue HEP, results of OM assessment and functional implications*** Person educated: Patient Education method: Explanation and Demonstration Education comprehension: verbalized understanding, returned demonstration, and needs further education  HOME  EXERCISE PROGRAM: To be reviewed/updated from previous POC: [email or web address]   Access Code: [email or web address] URL: [email or web address] Date: 05/30/2024 Prepared by: Waddell Southgate  Exercises - Sidelying Open Book Thoracic Lumbar Rotation and Extension  - 1 x daily - 7 x weekly - 1-2 sets - 10 reps - Supine Thoracic Mobilization Foam Roll Horizontal with Arm Stretch  - 1 x daily - 7 x weekly - 1-2 sets - 10 reps - Child's Pose with Sidebending  - 1 x daily - 7 x weekly - 1 sets - 3-5 reps - 30 sec hold - Child's Pose with Thread the Needle  - 1 x daily - 7 x weekly - 1 sets - 3-5 reps - 30 sec hold - Alternating Step Taps with Counter Support  - 1 x daily - 7 x weekly - 3 sets - 10 reps - Supine Lower Trunk Rotation  - 1 x daily - 7 x weekly - 2-3 sets - 10 reps - Half Kneeling Hip Flexor Stretch  - 1 x daily - 7 x weekly - 1 sets - 3-5 reps - 30 sec hold - Heel Sits  - 1 x daily - 7 x weekly - 3 sets - 10 reps - Hooklying Single Knee to Chest Stretch  - 1 x daily - 7 x weekly - 1 sets - 3-5 reps - 30 sec hold - Seated Knee Extension with Resistance  - 1 x daily - 7 x weekly - 3 sets - 10 reps - Seated March with Resistance  - 1 x daily - 7 x weekly - 3 sets - 10 reps  GOALS: Goals reviewed with patient? Yes  SHORT TERM GOALS: Target date: 06/16/2024   Pt will be independent with initial HEP for improved strength and endurance. Baseline: Goal status: MET  2.  Pt will increase her 30 sec sit to stands to 9 reps for improved endurance. Baseline: 7 reps (6/12), 6 reps (7/1) Goal status: NOT MET  3.  Pt will improve gait velocity to at least 2.5 ft/sec for improved gait efficiency and performance at mod I level  Baseline: 2.19 ft/sec no AD (6/12), 2.6 ft/sec no AD (7/1) Goal status: MET  4.  Pt will ambulate greater than or equal to 750 feet on with LRAD and mod I for improved cardiovascular endurance and BLE strength.  Baseline: 635 ft with SPC mod I (6/12), 439 ft with SPC mod  I (7/1) Goal status: NOT MET   LONG TERM GOALS: Target date: 07/07/2024   Pt will be independent with final HEP for improved strength and endurance. Baseline:  Goal status: INITIAL  2.  Pt will increase her 30 sec sit to stands to 9 reps for improved endurance. Baseline: 7 reps (6/12), 6 reps (7/1), 8 reps (7/18) Goal status: IN PROGRESS  3.  Pt will improve gait velocity to at least  2.75 ft/sec for improved gait efficiency and performance at mod I level  Baseline: 2.19 ft/sec no AD (6/12), 2.6 ft/sec no AD (7/1), 3.04 ft/sec no AD (7/18) Goal status: MET  4.  Pt will ambulate greater than or equal to 700 feet on with LRAD and mod I for improved cardiovascular endurance and BLE strength.  Baseline: 635 ft with SPC mod I (6/12), 439 ft with SPC mod I (7/1), 687 ft with SPC mod I (7/18) Goal status: IN PROGRESS   ASSESSMENT:  CLINICAL IMPRESSION: Emphasis of skilled PT session on*** initiating assessment of LTG for 10th visit progress note. Pt has made progress towards 3/3 goals assessed this date and met her LTG for improving her gait speed. She exhibits improved endurance based on ability to increase # reps during 30 sec sit to stand, increasing her gait speed, and increasing her distance covered during the . Despite having a rough day yesterday and feeling sore and stiff today she showed improvement in all areas. She continues to benefit from skilled PT services to work towards improving her endurance and independence with functional mobility. Continue POC.    OBJECTIVE IMPAIRMENTS: cardiopulmonary status limiting activity, decreased activity tolerance, decreased endurance, decreased mobility, difficulty walking, decreased strength, impaired perceived functional ability, and pain.   ACTIVITY LIMITATIONS: carrying, lifting, bending, standing, squatting, and stairs  PARTICIPATION LIMITATIONS: community activity and occupation  PERSONAL FACTORS: Time since onset of  injury/illness/exacerbation and 3+ comorbidities:  long-Covid, positive ANA, arthralgia, anemia, B12 deficiency, mixed hyperlipidemia, prediabetesare also affecting patient's functional outcome.   REHAB POTENTIAL: Good  CLINICAL DECISION MAKING: Stable/uncomplicated  EVALUATION COMPLEXITY: Low  PLAN:  PT FREQUENCY: 2x/week  PT DURATION: 6 weeks  PLANNED INTERVENTIONS: 97164- PT Re-evaluation, 97750- Physical Performance Testing, 97110-Therapeutic exercises, 97530- Therapeutic activity, [id number]- Neuromuscular re-education, 97535- Self Care, [id number]- Manual therapy, [phone]- Gait training, [phone]- Canalith repositioning, [phone]- Aquatic Therapy, [phone]- Electrical stimulation (manual), [phone] (1-2 muscles), [phone] (3+ muscles)- Dry Needling, Patient/Family education, Balance training, Stair training, Taping, Joint mobilization, Spinal manipulation, Vestibular training, DME instructions, Cryotherapy, and Moist heat  PLAN FOR NEXT SESSION: add visits vs d/c next week, monitor vitals and response to exercise, check BP, review prior HEP and addend as needed; SciFit vs elliptical vs treadmill (with incline), endurance training, LE functional strengthening (resisted sit to stands, half kneel), resisted gait, obstacle course***  Discuss POC (possibly 1x/week instead of 2x/week)   Waddell Southgate, PT Waddell Southgate, PT, DPT, CSRS  07/04/2024, 4:16 PM  For all possible CPT codes, reference the Planned Interventions line above.     Check all conditions that are expected to impact treatment: {Conditions expected to impact treatment:Presence of Medical Equipment   If treatment provided at initial evaluation, no treatment charged due to lack of authorization.

## 2024-07-05 ENCOUNTER — Encounter (HOSPITAL_BASED_OUTPATIENT_CLINIC_OR_DEPARTMENT_OTHER): Payer: Self-pay | Admitting: Obstetrics & Gynecology

## 2024-07-07 ENCOUNTER — Ambulatory Visit: Admitting: Physical Therapy

## 2024-07-07 VITALS — BP 98/63 | HR 71

## 2024-07-07 DIAGNOSIS — M6281 Muscle weakness (generalized): Secondary | ICD-10-CM | POA: Diagnosis not present

## 2024-07-07 DIAGNOSIS — R2689 Other abnormalities of gait and mobility: Secondary | ICD-10-CM | POA: Diagnosis not present

## 2024-07-07 DIAGNOSIS — R5381 Other malaise: Secondary | ICD-10-CM

## 2024-07-07 NOTE — Therapy (Signed)
 OUTPATIENT PHYSICAL THERAPY NEURO TREATMENT   Patient Name: Kelli Cooper MRN: [id number] DOB:November 22, 1981, 43 y.o., female Today's Date: 07/07/2024   PCP: Kelli Bascom RAMAN, NP REFERRING PROVIDER: Oley Bascom RAMAN, NP     END OF SESSION:  PT End of Session - 07/07/24 1534     Visit Number 12    Number of Visits 13   with eval   Date for PT Re-Evaluation 07/21/24    Authorization Type Medicaid [employer]    Authorization Time Period 4 visits approved 7/8-8/6    Authorization - Number of Visits 4    Progress Note Due on Visit 10    PT Start Time 1530    PT Stop Time 1610    PT Time Calculation (min) 40 min    Equipment Utilized During Treatment Gait belt    Activity Tolerance Patient tolerated treatment well    Behavior During Therapy WFL for tasks assessed/performed                    Past Medical History:  Diagnosis Date   Anemia    Arthralgia    COVID    DM (diabetes mellitus) (HCC)    Iron  deficiency anemia    Muscle pain    Palpitations    Positive ANA (antinuclear antibody)    SOB (shortness of breath)    Past Surgical History:  Procedure Laterality Date   NO PAST SURGERIES     Patient Active Problem List   Diagnosis Date Noted   Low vitamin D  level 04/05/2024   Fibroids 04/04/2024   Heavy menstrual bleeding 02/01/2024   Iron  deficiency anemia 09/17/2023   Positive ANA (antinuclear antibody) 08/06/2023   Muscle pain 07/30/2023    ONSET DATE: 05/11/2024 (referral date)  REFERRING DIAG: R53.81 (ICD-10-CM) - Physical deconditioning  THERAPY DIAG:  Physical deconditioning  Other abnormalities of gait and mobility  Muscle weakness (generalized)  Rationale for Evaluation and Treatment: Rehabilitation  SUBJECTIVE:                                                                                                                                                                                             SUBJECTIVE  STATEMENT:  Kelli Cooper  Pt with ongoing bleeding, reached out to her OBGYN. Pt reports otherwise today is a good day, feeling good today. Has had steady improvement since last Thursday, already did a walk and her exercises today.  Pt accompanied by: self  PERTINENT HISTORY: PMH: long-Covid, positive ANA, arthralgia, anemia, B12 deficiency, mixed hyperlipidemia, prediabetes  PAIN:  Are you having pain? No  PRECAUTIONS: None  RED FLAGS:  None   WEIGHT BEARING RESTRICTIONS: No  FALLS: Has patient fallen in last 6 months? No  LIVING ENVIRONMENT: Lives with: lives alone (with her dog) Lives in: House/apartment Stairs: Yes: Internal: 12 steps; on right going up Has following equipment at home: Single point cane  PLOF: Independent with gait, Independent with transfers, and Requires assistive device for independence  PATIENT GOALS: to be able to walk without a cane  OBJECTIVE:  Note: Objective measures were completed at Evaluation unless otherwise noted.  DIAGNOSTIC FINDINGS: None relevant to this POC  COGNITION: Overall cognitive status: Within functional limits for tasks assessed   SENSATION: WFL, no N/T  COORDINATION: WFL  EDEMA:  None per patient report  POSTURE: rounded shoulders and forward head  LOWER EXTREMITY ROM:  WFL   Active  Right Eval Left Eval  Hip flexion    Hip extension    Hip abduction    Hip adduction    Hip internal rotation    Hip external rotation    Knee flexion    Knee extension    Ankle dorsiflexion    Ankle plantarflexion    Ankle inversion    Ankle eversion     (Blank rows = not tested)  LOWER EXTREMITY MMT:    MMT Right Eval Left Eval  Hip flexion 4 4  Hip extension    Hip abduction    Hip adduction    Hip internal rotation    Hip external rotation    Knee flexion 4 4  Knee extension 4 4  Ankle dorsiflexion 4 4  Ankle plantarflexion    Ankle inversion    Ankle eversion    (Blank rows = not tested)  BED  MOBILITY:  Mod I per patient report  TRANSFERS: Sit to stand: Modified independence  Assistive device utilized: None     Stand to sit: Modified independence  Assistive device utilized: None     Chair to chair: Modified independence  Assistive device utilized: None       RAMP:  Not tested  CURB:  Not tested  STAIRS: Not tested GAIT: Findings:  Gait pattern: WFL Distance walked: various clinic distances Assistive device utilized: Single point cane Level of assistance: Modified independence Comments: WFL                                                                                                                               TREATMENT:  Self-Care/Home Management: Vitals:   07/07/24 1537  BP: 98/63  Pulse: 71   BP reading obtained in LUE in sitting at rest. BP again slightly lower this session but still within safe limits.  TherAct SciFit multi-peaks level 5 for 8 minutes using BUE/BLEs for neural priming for reciprocal movement, dynamic cardiovascular warmup and increased amplitude of stepping. RPE of 8/10 following activity.   To work on proximal strengthening in half-kneel position on red mat on floor: 6# weighted ball chest press 2 x 10 reps  B 6# weighted ball [place] press 2 x 10 reps B  To work on dynamic balance, hip strengthening, and SLS stability in // bars with BUE support: Sidestepping along purple beam 3 x 10 ft L/R Added in alt L/R cone taps 3 x 10 ft L/R   PATIENT EDUCATION: Education details: continue HEP Person educated: Patient Education method: Medical illustrator Education comprehension: verbalized understanding, returned demonstration, and needs further education  HOME EXERCISE PROGRAM: To be reviewed/updated from previous POC: [email or web address]   Access Code: [email or web address] URL: [email or web address] Date: 05/30/2024 Prepared by: Waddell Southgate  Exercises - Sidelying Open Book Thoracic Lumbar Rotation and Extension  - 1 x daily -  7 x weekly - 1-2 sets - 10 reps - Supine Thoracic Mobilization Foam Roll Horizontal with Arm Stretch  - 1 x daily - 7 x weekly - 1-2 sets - 10 reps - Child's Pose with Sidebending  - 1 x daily - 7 x weekly - 1 sets - 3-5 reps - 30 sec hold - Child's Pose with Thread the Needle  - 1 x daily - 7 x weekly - 1 sets - 3-5 reps - 30 sec hold - Alternating Step Taps with Counter Support  - 1 x daily - 7 x weekly - 3 sets - 10 reps - Supine Lower Trunk Rotation  - 1 x daily - 7 x weekly - 2-3 sets - 10 reps - Half Kneeling Hip Flexor Stretch  - 1 x daily - 7 x weekly - 1 sets - 3-5 reps - 30 sec hold - Heel Sits  - 1 x daily - 7 x weekly - 3 sets - 10 reps - Hooklying Single Knee to Chest Stretch  - 1 x daily - 7 x weekly - 1 sets - 3-5 reps - 30 sec hold - Seated Knee Extension with Resistance  - 1 x daily - 7 x weekly - 3 sets - 10 reps - Seated March with Resistance  - 1 x daily - 7 x weekly - 3 sets - 10 reps  GOALS: Goals reviewed with patient? Yes  SHORT TERM GOALS: Target date: 06/16/2024   Pt will be independent with initial HEP for improved strength and endurance. Baseline: Goal status: MET  2.  Pt will increase her 30 sec sit to stands to 9 reps for improved endurance. Baseline: 7 reps (6/12), 6 reps (7/1) Goal status: NOT MET  3.  Pt will improve gait velocity to at least 2.5 ft/sec for improved gait efficiency and performance at mod I level  Baseline: 2.19 ft/sec no AD (6/12), 2.6 ft/sec no AD (7/1) Goal status: MET  4.  Pt will ambulate greater than or equal to 750 feet on with LRAD and mod I for improved cardiovascular endurance and BLE strength.  Baseline: 635 ft with SPC mod I (6/12), 439 ft with SPC mod I (7/1) Goal status: NOT MET   LONG TERM GOALS: Target date: 07/18/2024 (updated to match last scheduled appt within POC)   Pt will be independent with final HEP for improved strength and endurance. Baseline:  Goal status: INITIAL  2.  Pt will increase her 30 sec  sit to stands to 9 reps for improved endurance. Baseline: 7 reps (6/12), 6 reps (7/1), 8 reps (7/18) Goal status: IN PROGRESS  3.  Pt will improve gait velocity to at least 2.75 ft/sec for improved gait efficiency and performance at mod I level  Baseline: 2.19 ft/sec no AD (6/12), 2.6 ft/sec no AD (  7/1), 3.04 ft/sec no AD (7/18) Goal status: MET  4.  Pt will ambulate greater than or equal to 700 feet on with LRAD and mod I for improved cardiovascular endurance and BLE strength.  Baseline: 635 ft with SPC mod I (6/12), 439 ft with SPC mod I (7/1), 687 ft with SPC mod I (7/18) Goal status: IN PROGRESS   ASSESSMENT:  CLINICAL IMPRESSION: Emphasis of skilled PT session on continuing to work on global endurance training, dynamic balance training, and proximal strengthening. Pt does well with increased resistance level on SciFit and increase in challenge of sidestepping on compliant surface task. She continues to benefit from skilled PT services to work towards improving her endurance and independence with functional mobility. Continue POC.    OBJECTIVE IMPAIRMENTS: cardiopulmonary status limiting activity, decreased activity tolerance, decreased endurance, decreased mobility, difficulty walking, decreased strength, impaired perceived functional ability, and pain.   ACTIVITY LIMITATIONS: carrying, lifting, bending, standing, squatting, and stairs  PARTICIPATION LIMITATIONS: community activity and occupation  PERSONAL FACTORS: Time since onset of injury/illness/exacerbation and 3+ comorbidities:  long-Covid, positive ANA, arthralgia, anemia, B12 deficiency, mixed hyperlipidemia, prediabetesare also affecting patient's functional outcome.   REHAB POTENTIAL: Good  CLINICAL DECISION MAKING: Stable/uncomplicated  EVALUATION COMPLEXITY: Low  PLAN:  PT FREQUENCY: 2x/week  PT DURATION: 6 weeks  PLANNED INTERVENTIONS: 97164- PT Re-evaluation, 97750- Physical Performance Testing,  97110-Therapeutic exercises, 97530- Therapeutic activity, [id number]- Neuromuscular re-education, 97535- Self Care, 02859- Manual therapy, [phone]- Gait training, [phone]- Canalith repositioning, [phone]- Aquatic Therapy, [phone]- Electrical stimulation (manual), [phone] (1-2 muscles), [phone] (3+ muscles)- Dry Needling, Patient/Family education, Balance training, Stair training, Taping, Joint mobilization, Spinal manipulation, Vestibular training, DME instructions, Cryotherapy, and Moist heat  PLAN FOR NEXT SESSION: RECERT, did we get insurance approval?, monitor vitals and response to exercise, check BP, review prior HEP and addend as needed; SciFit vs elliptical vs treadmill (with incline), endurance training, LE functional strengthening (resisted sit to stands, half kneel), resisted gait, obstacle course; more standing endurance tasks   Waddell Southgate, PT Waddell Southgate, PT, DPT, CSRS  07/07/2024, 4:15 PM  For all possible CPT codes, reference the Planned Interventions line above.     Check all conditions that are expected to impact treatment: {Conditions expected to impact treatment:Presence of Medical Equipment   If treatment provided at initial evaluation, no treatment charged due to lack of authorization.

## 2024-07-08 DIAGNOSIS — F4312 Post-traumatic stress disorder, chronic: Secondary | ICD-10-CM | POA: Diagnosis not present

## 2024-07-11 ENCOUNTER — Other Ambulatory Visit (HOSPITAL_COMMUNITY): Payer: Self-pay

## 2024-07-18 ENCOUNTER — Ambulatory Visit: Payer: Self-pay | Attending: Nurse Practitioner | Admitting: Physical Therapy

## 2024-07-18 VITALS — BP 111/76 | HR 62

## 2024-07-18 DIAGNOSIS — R2689 Other abnormalities of gait and mobility: Secondary | ICD-10-CM | POA: Diagnosis not present

## 2024-07-18 DIAGNOSIS — M6281 Muscle weakness (generalized): Secondary | ICD-10-CM | POA: Diagnosis not present

## 2024-07-18 DIAGNOSIS — R5381 Other malaise: Secondary | ICD-10-CM | POA: Insufficient documentation

## 2024-07-18 NOTE — Therapy (Signed)
 OUTPATIENT PHYSICAL THERAPY NEURO TREATMENT - RECERT   Patient Name: Kelli Cooper MRN: [id number] DOB:1981/05/02, 43 y.o., female Today's Date: 07/18/2024   PCP: Oley Bascom RAMAN, NP REFERRING PROVIDER: Oley Bascom RAMAN, NP     END OF SESSION:  PT End of Session - 07/18/24 1017     Visit Number 13    Number of Visits 18   recert   Date for PT Re-Evaluation 08/22/24   recert   Authorization Type Medicaid [employer]    Authorization Time Period 5 visits approved 07/13/24-09/10/24    Authorization - Visit Number 1    Authorization - Number of Visits 5    Progress Note Due on Visit 10    PT Start Time 1015    PT Stop Time 1056    PT Time Calculation (min) 41 min    Equipment Utilized During Treatment Gait belt    Activity Tolerance Patient tolerated treatment well    Behavior During Therapy WFL for tasks assessed/performed                     Past Medical History:  Diagnosis Date   Anemia    Arthralgia    COVID    DM (diabetes mellitus) (HCC)    Iron  deficiency anemia    Muscle pain    Palpitations    Positive ANA (antinuclear antibody)    SOB (shortness of breath)    Past Surgical History:  Procedure Laterality Date   NO PAST SURGERIES     Patient Active Problem List   Diagnosis Date Noted   Low vitamin D  level 04/05/2024   Fibroids 04/04/2024   Heavy menstrual bleeding 02/01/2024   Iron  deficiency anemia 09/17/2023   Positive ANA (antinuclear antibody) 08/06/2023   Muscle pain 07/30/2023    ONSET DATE: 05/11/2024 (referral date)  REFERRING DIAG: R53.81 (ICD-10-CM) - Physical deconditioning  THERAPY DIAG:  Physical deconditioning  Other abnormalities of gait and mobility  Muscle weakness (generalized)  Rationale for Evaluation and Treatment: Rehabilitation  SUBJECTIVE:                                                                                                                                                                                              SUBJECTIVE STATEMENT:  Kelli Cooper  Pt continues to have up/downs that feel very extreme, able to mow the yard some days but then had to have someone send her a sandwich another day. She feels like her body is not unlocking and feels like it is constricting over the past week.  She did have a fall last night, was sweeping  and felt into the wall, just hurt my pride.  Pt accompanied by: self  PERTINENT HISTORY: PMH: long-Covid, positive ANA, arthralgia, anemia, B12 deficiency, mixed hyperlipidemia, prediabetes  PAIN:  Are you having pain? Yes: NPRS scale: 3/10 Pain location: all over body Pain description: N/A Aggravating factors: N/A Relieving factors: N/A  PRECAUTIONS: None  RED FLAGS: None   WEIGHT BEARING RESTRICTIONS: No  FALLS: Has patient fallen in last 6 months? No  LIVING ENVIRONMENT: Lives with: lives alone (with her dog) Lives in: House/apartment Stairs: Yes: Internal: 12 steps; on right going up Has following equipment at home: Single point cane  PLOF: Independent with gait, Independent with transfers, and Requires assistive device for independence  PATIENT GOALS: to be able to walk without a cane  OBJECTIVE:  Note: Objective measures were completed at Evaluation unless otherwise noted.  DIAGNOSTIC FINDINGS: None relevant to this POC  COGNITION: Overall cognitive status: Within functional limits for tasks assessed   SENSATION: WFL, no N/T  COORDINATION: WFL  EDEMA:  None per patient report  POSTURE: rounded shoulders and forward head  LOWER EXTREMITY ROM:  WFL   Active  Right Eval Left Eval  Hip flexion    Hip extension    Hip abduction    Hip adduction    Hip internal rotation    Hip external rotation    Knee flexion    Knee extension    Ankle dorsiflexion    Ankle plantarflexion    Ankle inversion    Ankle eversion     (Blank rows = not tested)  LOWER EXTREMITY MMT:    MMT Right Eval Left Eval   Hip flexion 4 4  Hip extension    Hip abduction    Hip adduction    Hip internal rotation    Hip external rotation    Knee flexion 4 4  Knee extension 4 4  Ankle dorsiflexion 4 4  Ankle plantarflexion    Ankle inversion    Ankle eversion    (Blank rows = not tested)  BED MOBILITY:  Mod I per patient report  TRANSFERS: Sit to stand: Modified independence  Assistive device utilized: None     Stand to sit: Modified independence  Assistive device utilized: None     Chair to chair: Modified independence  Assistive device utilized: None       RAMP:  Not tested  CURB:  Not tested  STAIRS: Not tested GAIT: Findings:  Gait pattern: WFL Distance walked: various clinic distances Assistive device utilized: Single point cane Level of assistance: Modified independence Comments: WFL                                                                                                                               TREATMENT:  Self-Care/Home Management: Vitals:   07/18/24 1022  BP: 111/76  Pulse: 62    BP reading obtained in LUE in sitting at rest. BP improved this session as  compared to previous sessions.  TherAct NuStep level 5 for 8 minutes using BUE/BLEs for neural priming for reciprocal movement, dynamic cardiovascular warmup and increased amplitude of stepping. RPE of 6/10 following activity.   To address onset of core/trunk tightness and B quad tightness: Supine LTR x 10 reps B Supine butterfly stretch 3 x 30 sec Supine SKTC 3 x 30 sec each  Physical Performance 30 sec sit to stand: 9 reps, no UE Gait speed: 2.47 ft/sec no AD : 585 ft, RPE 6/10   PATIENT EDUCATION: Education details: continue HEP, results of OM and functional implications, PT POC going forwards Person educated: Patient Education method: Medical illustrator Education comprehension: verbalized understanding, returned demonstration, and needs further education  HOME EXERCISE  PROGRAM: To be reviewed/updated from previous POC: [email or web address]   Access Code: [email or web address] URL: [email or web address] Date: 05/30/2024 Prepared by: Waddell Southgate  Exercises - Sidelying Open Book Thoracic Lumbar Rotation and Extension  - 1 x daily - 7 x weekly - 1-2 sets - 10 reps - Supine Thoracic Mobilization Foam Roll Horizontal with Arm Stretch  - 1 x daily - 7 x weekly - 1-2 sets - 10 reps - Child's Pose with Sidebending  - 1 x daily - 7 x weekly - 1 sets - 3-5 reps - 30 sec hold - Child's Pose with Thread the Needle  - 1 x daily - 7 x weekly - 1 sets - 3-5 reps - 30 sec hold - Alternating Step Taps with Counter Support  - 1 x daily - 7 x weekly - 3 sets - 10 reps - Supine Lower Trunk Rotation  - 1 x daily - 7 x weekly - 2-3 sets - 10 reps - Half Kneeling Hip Flexor Stretch  - 1 x daily - 7 x weekly - 1 sets - 3-5 reps - 30 sec hold - Heel Sits  - 1 x daily - 7 x weekly - 3 sets - 10 reps - Hooklying Single Knee to Chest Stretch  - 1 x daily - 7 x weekly - 1 sets - 3-5 reps - 30 sec hold - Seated Knee Extension with Resistance  - 1 x daily - 7 x weekly - 3 sets - 10 reps - Seated March with Resistance  - 1 x daily - 7 x weekly - 3 sets - 10 reps  GOALS: Goals reviewed with patient? Yes  SHORT TERM GOALS: Target date: 06/16/2024   Pt will be independent with initial HEP for improved strength and endurance. Baseline: Goal status: MET  2.  Pt will increase her 30 sec sit to stands to 9 reps for improved endurance. Baseline: 7 reps (6/12), 6 reps (7/1) Goal status: NOT MET  3.  Pt will improve gait velocity to at least 2.5 ft/sec for improved gait efficiency and performance at mod I level  Baseline: 2.19 ft/sec no AD (6/12), 2.6 ft/sec no AD (7/1) Goal status: MET  4.  Pt will ambulate greater than or equal to 750 feet on with LRAD and mod I for improved cardiovascular endurance and BLE strength.  Baseline: 635 ft with SPC mod I (6/12), 439 ft with SPC mod I  (7/1) Goal status: NOT MET   LONG TERM GOALS: Target date: 07/18/2024 (updated to match last scheduled appt within POC)   Pt will be independent with final HEP for improved strength and endurance. Baseline:  Goal status: IN PROGRESS  2.  Pt will increase her 30 sec sit to stands to 9 reps  for improved endurance. Baseline: 7 reps (6/12), 6 reps (7/1), 8 reps (7/18), 9 reps (8/4) Goal status: MET  3.  Pt will improve gait velocity to at least 2.75 ft/sec for improved gait efficiency and performance at mod I level  Baseline: 2.19 ft/sec no AD (6/12), 2.6 ft/sec no AD (7/1), 3.04 ft/sec no AD (7/18) Goal status: MET  4.  Pt will ambulate greater than or equal to 700 feet on with LRAD and mod I for improved cardiovascular endurance and BLE strength.  Baseline: 635 ft with SPC mod I (6/12), 439 ft with SPC mod I (7/1), 687 ft with SPC mod I (7/18), 585 ft with SPC mod I (8/4) Goal status: IN PROGRESS  NEW SHORT TERM GOALS=NEW LONG TERM GOALS due to length of POC   NEW LONG TERM GOALS:  Target date: 08/15/2024  Pt will be independent with final HEP for improved strength and endurance. Baseline:  Goal status: IN PROGRESS   2.  Pt will ambulate greater than or equal to 700 feet on with LRAD and mod I for improved cardiovascular endurance and BLE strength.  Baseline: 635 ft with SPC mod I (6/12), 439 ft with SPC mod I (7/1), 687 ft with SPC mod I (7/18), 585 ft with SPC mod I (8/4) Goal status: IN PROGRESS    ASSESSMENT:  CLINICAL IMPRESSION: Emphasis of skilled PT session on assessing LTG for recertification of PT services. Pt has met 2/4 LTG assessed this date and is making progress towards remaining 2/4 LTG assessed. She improved her gait speed from 2.19 ft/sec initially to 3.04 ft/sec this date and improved her 30 sec sit to stand score from 6 reps upon one assessment to 9 reps this date, indicating improved functional LE strength, endurance, and overall improved balance.  Her distance covered on the continues to fluctuate as her energy levels and performance also continue to fluctuate day to day. She is making progress towards being independent with her final HEP, plan to work on isometric exercises next visit to work on her being able to increase her BP when it is running low and she is not feeling as well. She continues to benefit from skilled PT services to work towards improving her endurance and independence with functional mobility. Continue POC.    OBJECTIVE IMPAIRMENTS: cardiopulmonary status limiting activity, decreased activity tolerance, decreased endurance, decreased mobility, difficulty walking, decreased strength, impaired perceived functional ability, and pain.   ACTIVITY LIMITATIONS: carrying, lifting, bending, standing, squatting, and stairs  PARTICIPATION LIMITATIONS: community activity and occupation  PERSONAL FACTORS: Time since onset of injury/illness/exacerbation and 3+ comorbidities:  long-Covid, positive ANA, arthralgia, anemia, B12 deficiency, mixed hyperlipidemia, prediabetesare also affecting patient's functional outcome.   REHAB POTENTIAL: Good  CLINICAL DECISION MAKING: Stable/uncomplicated  EVALUATION COMPLEXITY: Low  PLAN:  PT FREQUENCY: 2x/week + 1x/week (recert)  PT DURATION: 6 weeks + 4 visits (recert)  PLANNED INTERVENTIONS: 02835- PT Re-evaluation, 97750- Physical Performance Testing, 97110-Therapeutic exercises, 97530- Therapeutic activity, [id number]- Neuromuscular re-education, 97535- Self Care, [id number]- Manual therapy, [id number]- Gait training, [phone]- Canalith repositioning, [id number]- Aquatic Therapy, [phone]- Electrical stimulation (manual), [phone] (1-2 muscles), [id number] (3+ muscles)- Dry Needling, Patient/Family education, Balance training, Stair training, Taping, Joint mobilization, Spinal manipulation, Vestibular training, DME instructions, Cryotherapy, and Moist heat  PLAN FOR NEXT SESSION: RECERT, did we get insurance  approval?, monitor vitals and response to exercise, check BP, review prior HEP and addend as needed; SciFit vs elliptical vs treadmill (with incline), endurance training, LE functional strengthening (resisted  sit to stands, half kneel), resisted gait, obstacle course; more standing endurance tasks, work on isometrics to increase blood pressure   Waddell Southgate, PT Waddell Southgate, PT, DPT, CSRS  07/18/2024, 10:58 AM  For all possible CPT codes, reference the Planned Interventions line above.     Check all conditions that are expected to impact treatment: {Conditions expected to impact treatment:Presence of Medical Equipment   If treatment provided at initial evaluation, no treatment charged due to lack of authorization.

## 2024-07-25 ENCOUNTER — Ambulatory Visit: Payer: Self-pay | Admitting: Physical Therapy

## 2024-07-25 VITALS — BP 110/74 | HR 57

## 2024-07-25 DIAGNOSIS — M6281 Muscle weakness (generalized): Secondary | ICD-10-CM | POA: Diagnosis not present

## 2024-07-25 DIAGNOSIS — R2689 Other abnormalities of gait and mobility: Secondary | ICD-10-CM | POA: Diagnosis not present

## 2024-07-25 DIAGNOSIS — R5381 Other malaise: Secondary | ICD-10-CM

## 2024-07-25 NOTE — Therapy (Signed)
 OUTPATIENT PHYSICAL THERAPY NEURO TREATMENT   Patient Name: Kelli Cooper MRN: [id number] DOB:Mar 28, 1981, 43 y.o., female Today's Date: 07/25/2024   PCP: Oley Bascom RAMAN, NP REFERRING PROVIDER: Oley Bascom RAMAN, NP     END OF SESSION:  PT End of Session - 07/25/24 1532     Visit Number 14    Number of Visits 18   recert   Date for PT Re-Evaluation 08/22/24   recert   Authorization Type Medicaid [employer]    Authorization Time Period 5 visits approved 07/13/24-09/10/24    Authorization - Number of Visits 5    Progress Note Due on Visit 10    PT Start Time 1530    PT Stop Time 1613    PT Time Calculation (min) 43 min    Equipment Utilized During Treatment Gait belt    Activity Tolerance Patient tolerated treatment well    Behavior During Therapy WFL for tasks assessed/performed                      Past Medical History:  Diagnosis Date   Anemia    Arthralgia    COVID    DM (diabetes mellitus) (HCC)    Iron  deficiency anemia    Muscle pain    Palpitations    Positive ANA (antinuclear antibody)    SOB (shortness of breath)    Past Surgical History:  Procedure Laterality Date   NO PAST SURGERIES     Patient Active Problem List   Diagnosis Date Noted   Low vitamin D  level 04/05/2024   Fibroids 04/04/2024   Heavy menstrual bleeding 02/01/2024   Iron  deficiency anemia 09/17/2023   Positive ANA (antinuclear antibody) 08/06/2023   Muscle pain 07/30/2023    ONSET DATE: 05/11/2024 (referral date)  REFERRING DIAG: R53.81 (ICD-10-CM) - Physical deconditioning  THERAPY DIAG:  Physical deconditioning  Other abnormalities of gait and mobility  Muscle weakness (generalized)  Rationale for Evaluation and Treatment: Rehabilitation  SUBJECTIVE:                                                                                                                                                                                             SUBJECTIVE  STATEMENT:  Kelli Cooper  Pt reports that when she wakes up in the morning now she is feeling rested, this is the first time in 3 years she has felt that way. One near fall since last visit but she caught herself. No other acute changes.  Pt did walk 2 blocks earlier today.  Pt accompanied by: self  PERTINENT HISTORY: PMH: long-Covid, positive ANA, arthralgia, anemia, B12  deficiency, mixed hyperlipidemia, prediabetes  PAIN:  Are you having pain? Yes: NPRS scale: 3/10 Pain location: all over body Pain description: N/A Aggravating factors: N/A Relieving factors: N/A  PRECAUTIONS: None  RED FLAGS: None   WEIGHT BEARING RESTRICTIONS: No  FALLS: Has patient fallen in last 6 months? No  LIVING ENVIRONMENT: Lives with: lives alone (with her dog) Lives in: House/apartment Stairs: Yes: Internal: 12 steps; on right going up Has following equipment at home: Single point cane  PLOF: Independent with gait, Independent with transfers, and Requires assistive device for independence  PATIENT GOALS: to be able to walk without a cane  OBJECTIVE:  Note: Objective measures were completed at Evaluation unless otherwise noted.  DIAGNOSTIC FINDINGS: None relevant to this POC  COGNITION: Overall cognitive status: Within functional limits for tasks assessed   SENSATION: WFL, no N/T  COORDINATION: WFL  EDEMA:  None per patient report  POSTURE: rounded shoulders and forward head  LOWER EXTREMITY ROM:  WFL   Active  Right Eval Left Eval  Hip flexion    Hip extension    Hip abduction    Hip adduction    Hip internal rotation    Hip external rotation    Knee flexion    Knee extension    Ankle dorsiflexion    Ankle plantarflexion    Ankle inversion    Ankle eversion     (Blank rows = not tested)  LOWER EXTREMITY MMT:    MMT Right Eval Left Eval  Hip flexion 4 4  Hip extension    Hip abduction    Hip adduction    Hip internal rotation    Hip external rotation     Knee flexion 4 4  Knee extension 4 4  Ankle dorsiflexion 4 4  Ankle plantarflexion    Ankle inversion    Ankle eversion    (Blank rows = not tested)  BED MOBILITY:  Mod I per patient report  TRANSFERS: Sit to stand: Modified independence  Assistive device utilized: None     Stand to sit: Modified independence  Assistive device utilized: None     Chair to chair: Modified independence  Assistive device utilized: None       RAMP:  Not tested  CURB:  Not tested  STAIRS: Not tested GAIT: Findings:  Gait pattern: WFL Distance walked: various clinic distances Assistive device utilized: Single point cane Level of assistance: Modified independence Comments: WFL                                                                                                                               TREATMENT:  Self-Care/Home Management: Vitals:   07/25/24 1536  BP: 110/74  Pulse: (!) 57   BP reading obtained in LUE in sitting at rest. BP improved this session as compared to previous sessions, though her HR is slightly low as compared to previous readings.  TherAct Isometric therex to address low BP and  increase patient independence with management of BP with transitional movements: Supine quad sets x 10 reps B with 5 sec hold Seated hip add squeeze x 10 reps with pillow, 5 sec hold Seated glute squeezes x 10 reps with 5 sec hold Seated LAQ x 10 reps with 5 sec hold B Not added to HEP due to exercise not being isometric and pt not feeling it as much as others   Added appropriate exercises to HEP, see bolded below    PATIENT EDUCATION: Education details: continue HEP, added to HEP Person educated: Patient Education method: Explanation, Demonstration, and Handouts Education comprehension: verbalized understanding, returned demonstration, and needs further education  HOME EXERCISE PROGRAM: To be reviewed/updated from previous POC: [email or web address]   Access Code: [email or web address] URL:  [email or web address] Date: 05/30/2024 Prepared by: Waddell Southgate  Exercises - Sidelying Open Book Thoracic Lumbar Rotation and Extension  - 1 x daily - 7 x weekly - 1-2 sets - 10 reps - Supine Thoracic Mobilization Foam Roll Horizontal with Arm Stretch  - 1 x daily - 7 x weekly - 1-2 sets - 10 reps - Child's Pose with Sidebending  - 1 x daily - 7 x weekly - 1 sets - 3-5 reps - 30 sec hold - Child's Pose with Thread the Needle  - 1 x daily - 7 x weekly - 1 sets - 3-5 reps - 30 sec hold - Alternating Step Taps with Counter Support  - 1 x daily - 7 x weekly - 3 sets - 10 reps - Supine Lower Trunk Rotation  - 1 x daily - 7 x weekly - 2-3 sets - 10 reps - Half Kneeling Hip Flexor Stretch  - 1 x daily - 7 x weekly - 1 sets - 3-5 reps - 30 sec hold - Heel Sits  - 1 x daily - 7 x weekly - 3 sets - 10 reps - Hooklying Single Knee to Chest Stretch  - 1 x daily - 7 x weekly - 1 sets - 3-5 reps - 30 sec hold - Seated Knee Extension with Resistance  - 1 x daily - 7 x weekly - 3 sets - 10 reps - Seated March with Resistance  - 1 x daily - 7 x weekly - 3 sets - 10 reps - Supine Quad Set  - 1 x daily - 7 x weekly - 3 sets - 10 reps - 5 sec hold - Hooklying Gluteal Sets  - 1 x daily - 7 x weekly - 3 sets - 10 reps - 5 sec hold - Bridge  - 1 x daily - 7 x weekly - 3 sets - 5-10 reps - 5 sec hold - Seated Hip Adduction Isometrics with Ball  - 1 x daily - 7 x weekly - 3 sets - 10 reps - 5 sec hold - Seated Gluteal Sets  - 1 x daily - 7 x weekly - 3 sets - 10 reps - 5 sec hold  GOALS: Goals reviewed with patient? Yes  SHORT TERM GOALS: Target date: 06/16/2024   Pt will be independent with initial HEP for improved strength and endurance. Baseline: Goal status: MET  2.  Pt will increase her 30 sec sit to stands to 9 reps for improved endurance. Baseline: 7 reps (6/12), 6 reps (7/1) Goal status: NOT MET  3.  Pt will improve gait velocity to at least 2.5 ft/sec for improved gait efficiency  and performance at mod I level  Baseline: 2.19 ft/sec no AD (6/12), 2.6  ft/sec no AD (7/1) Goal status: MET  4.  Pt will ambulate greater than or equal to 750 feet on with LRAD and mod I for improved cardiovascular endurance and BLE strength.  Baseline: 635 ft with SPC mod I (6/12), 439 ft with SPC mod I (7/1) Goal status: NOT MET   LONG TERM GOALS: Target date: 07/18/2024 (updated to match last scheduled appt within POC)   Pt will be independent with final HEP for improved strength and endurance. Baseline:  Goal status: IN PROGRESS  2.  Pt will increase her 30 sec sit to stands to 9 reps for improved endurance. Baseline: 7 reps (6/12), 6 reps (7/1), 8 reps (7/18), 9 reps (8/4) Goal status: MET  3.  Pt will improve gait velocity to at least 2.75 ft/sec for improved gait efficiency and performance at mod I level  Baseline: 2.19 ft/sec no AD (6/12), 2.6 ft/sec no AD (7/1), 3.04 ft/sec no AD (7/18) Goal status: MET  4.  Pt will ambulate greater than or equal to 700 feet on with LRAD and mod I for improved cardiovascular endurance and BLE strength.  Baseline: 635 ft with SPC mod I (6/12), 439 ft with SPC mod I (7/1), 687 ft with SPC mod I (7/18), 585 ft with SPC mod I (8/4) Goal status: IN PROGRESS  NEW SHORT TERM GOALS=NEW LONG TERM GOALS due to length of POC   NEW LONG TERM GOALS:  Target date: 08/15/2024  Pt will be independent with final HEP for improved strength and endurance. Baseline:  Goal status: IN PROGRESS   2.  Pt will ambulate greater than or equal to 700 feet on with LRAD and mod I for improved cardiovascular endurance and BLE strength.  Baseline: 635 ft with SPC mod I (6/12), 439 ft with SPC mod I (7/1), 687 ft with SPC mod I (7/18), 585 ft with SPC mod I (8/4) Goal status: IN PROGRESS    ASSESSMENT:  CLINICAL IMPRESSION: Emphasis of skilled PT session on reviewing isometric exercises in supine and seated position to work on increasing her BP prior  to positional changes. Pt with fair tolerance for supine positioning this date due to menstrual cycle but able to verbally review supine and hooklying exercises. Pt with good response to isometric exercises in supine/hooklying and seated positions this date, added to HEP. Continue POC.   OBJECTIVE IMPAIRMENTS: cardiopulmonary status limiting activity, decreased activity tolerance, decreased endurance, decreased mobility, difficulty walking, decreased strength, impaired perceived functional ability, and pain.   ACTIVITY LIMITATIONS: carrying, lifting, bending, standing, squatting, and stairs  PARTICIPATION LIMITATIONS: community activity and occupation  PERSONAL FACTORS: Time since onset of injury/illness/exacerbation and 3+ comorbidities:  long-Covid, positive ANA, arthralgia, anemia, B12 deficiency, mixed hyperlipidemia, prediabetesare also affecting patient's functional outcome.   REHAB POTENTIAL: Good  CLINICAL DECISION MAKING: Stable/uncomplicated  EVALUATION COMPLEXITY: Low  PLAN:  PT FREQUENCY: 2x/week + 1x/week (recert)  PT DURATION: 6 weeks + 4 visits (recert)  PLANNED INTERVENTIONS: 02835- PT Re-evaluation, 97750- Physical Performance Testing, 97110-Therapeutic exercises, 97530- Therapeutic activity, [id number]- Neuromuscular re-education, 97535- Self Care, [id number]- Manual therapy, [id number]- Gait training, [phone]- Canalith repositioning, [id number]- Aquatic Therapy, [phone]- Electrical stimulation (manual), [phone] (1-2 muscles), [id number] (3+ muscles)- Dry Needling, Patient/Family education, Balance training, Stair training, Taping, Joint mobilization, Spinal manipulation, Vestibular training, DME instructions, Cryotherapy, and Moist heat  PLAN FOR NEXT SESSION: monitor vitals and response to exercise, check BP, review prior HEP and addend as needed; SciFit vs elliptical vs treadmill (with incline), endurance training,  LE functional strengthening (resisted sit to stands, half kneel), resisted gait,  obstacle course; more standing endurance tasks, how are isometrics to increase blood pressure?   Juliene Kirsh, PT Waddell Southgate, PT, DPT, CSRS  07/25/2024, 4:13 PM  For all possible CPT codes, reference the Planned Interventions line above.     Check all conditions that are expected to impact treatment: {Conditions expected to impact treatment:Presence of Medical Equipment   If treatment provided at initial evaluation, no treatment charged due to lack of authorization.

## 2024-07-29 DIAGNOSIS — F4312 Post-traumatic stress disorder, chronic: Secondary | ICD-10-CM | POA: Diagnosis not present

## 2024-08-01 ENCOUNTER — Ambulatory Visit: Payer: Self-pay | Admitting: Physical Therapy

## 2024-08-01 VITALS — BP 94/58 | HR 68

## 2024-08-01 DIAGNOSIS — M6281 Muscle weakness (generalized): Secondary | ICD-10-CM

## 2024-08-01 DIAGNOSIS — R5381 Other malaise: Secondary | ICD-10-CM

## 2024-08-01 DIAGNOSIS — R2689 Other abnormalities of gait and mobility: Secondary | ICD-10-CM | POA: Diagnosis not present

## 2024-08-01 NOTE — Therapy (Signed)
 OUTPATIENT PHYSICAL THERAPY NEURO TREATMENT   Patient Name: Kelli Cooper MRN: [id number] DOB:[birth year], 43 y.o., female Today's Date: 08/01/2024   PCP: Oley Bascom RAMAN, NP REFERRING PROVIDER: Oley Bascom RAMAN, NP     END OF SESSION:  PT End of Session - 08/01/24 1534     Visit Number 15    Number of Visits 18   recert   Date for PT Re-Evaluation 08/22/24   recert   Authorization Type Medicaid [employer]    Authorization Time Period 5 visits approved 07/13/24-09/10/24    Authorization - Number of Visits 5    Progress Note Due on Visit 10    PT Start Time 1532    PT Stop Time 1615    PT Time Calculation (min) 43 min    Equipment Utilized During Treatment Gait belt    Activity Tolerance Patient tolerated treatment well    Behavior During Therapy WFL for tasks assessed/performed                       Past Medical History:  Diagnosis Date   Anemia    Arthralgia    COVID    DM (diabetes mellitus) (HCC)    Iron  deficiency anemia    Muscle pain    Palpitations    Positive ANA (antinuclear antibody)    SOB (shortness of breath)    Past Surgical History:  Procedure Laterality Date   NO PAST SURGERIES     Patient Active Problem List   Diagnosis Date Noted   Low vitamin D  level 04/05/2024   Fibroids 04/04/2024   Heavy menstrual bleeding 02/01/2024   Iron  deficiency anemia 09/17/2023   Positive ANA (antinuclear antibody) 08/06/2023   Muscle pain 07/30/2023    ONSET DATE: 05/11/2024 (referral date)  REFERRING DIAG: R53.81 (ICD-10-CM) - Physical deconditioning  THERAPY DIAG:  Physical deconditioning  Other abnormalities of gait and mobility  Muscle weakness (generalized)  Rationale for Evaluation and Treatment: Rehabilitation  SUBJECTIVE:                                                                                                                                                                                             SUBJECTIVE  STATEMENT:  Kelli Cooper  Pt continues to consistently wake up feeling rested. Pt will get bursts of energy during the day, has to be careful about pushing herself too much when she is feeling good. Pt went for her walk earlier today. She is feeling stiff today, has not yet done her stretches.  Pt has been able to starting walking her dog about a block as well!  Pt accompanied by: self  PERTINENT HISTORY: PMH: long-Covid, positive ANA, arthralgia, anemia, B12 deficiency, mixed hyperlipidemia, prediabetes  PAIN:  Are you having pain? Yes: NPRS scale: 3/10 Pain location: all over body Pain description: N/A Aggravating factors: N/A Relieving factors: N/A  PRECAUTIONS: None  RED FLAGS: None   WEIGHT BEARING RESTRICTIONS: No  FALLS: Has patient fallen in last 6 months? No  LIVING ENVIRONMENT: Lives with: lives alone (with her dog) Lives in: House/apartment Stairs: Yes: Internal: 12 steps; on right going up Has following equipment at home: Single point cane  PLOF: Independent with gait, Independent with transfers, and Requires assistive device for independence  PATIENT GOALS: to be able to walk without a cane  OBJECTIVE:  Note: Objective measures were completed at Evaluation unless otherwise noted.  DIAGNOSTIC FINDINGS: None relevant to this POC  COGNITION: Overall cognitive status: Within functional limits for tasks assessed   SENSATION: WFL, no N/T  COORDINATION: WFL  EDEMA:  None per patient report  POSTURE: rounded shoulders and forward head  LOWER EXTREMITY ROM:  WFL   Active  Right Eval Left Eval  Hip flexion    Hip extension    Hip abduction    Hip adduction    Hip internal rotation    Hip external rotation    Knee flexion    Knee extension    Ankle dorsiflexion    Ankle plantarflexion    Ankle inversion    Ankle eversion     (Blank rows = not tested)  LOWER EXTREMITY MMT:    MMT Right Eval Left Eval  Hip flexion 4 4  Hip extension     Hip abduction    Hip adduction    Hip internal rotation    Hip external rotation    Knee flexion 4 4  Knee extension 4 4  Ankle dorsiflexion 4 4  Ankle plantarflexion    Ankle inversion    Ankle eversion    (Blank rows = not tested)  BED MOBILITY:  Mod I per patient report  TRANSFERS: Sit to stand: Modified independence  Assistive device utilized: None     Stand to sit: Modified independence  Assistive device utilized: None     Chair to chair: Modified independence  Assistive device utilized: None       RAMP:  Not tested  CURB:  Not tested  STAIRS: Not tested GAIT: Findings:  Gait pattern: WFL Distance walked: various clinic distances Assistive device utilized: Single point cane Level of assistance: Modified independence Comments: WFL                                                                                                                               TREATMENT:  Self-Care/Home Management: Vitals:   08/01/24 1537  BP: (!) 94/58  Pulse: 68   BP reading obtained in LUE in sitting at rest. BP improved this session as compared to previous sessions, though her HR is slightly low  as compared to previous readings.  TherAct SciFit multi-peaks level 4 for 8 minutes using BUE/BLEs for neural priming for reciprocal movement, dynamic cardiovascular warmup and increased amplitude of stepping. RPE of 5/10 following activity.    5 Blaze pods on random setting for improved dynamic balance and obstacle navigation.  Performed on 1 minute intervals with 30 rest periods.  Pt requires CGA guarding. Pods placed around blue mat for compliant surface stepping over 1 foam hurdles. Round 1:  21 hits. Round 2:  20 hits. Round 3:  21 hits. Notable errors/deficits:  needs BUE support on // bars Added in 4 and 6 step for last round: Round 1:  22 hits. Round 2:  22 hits. Round 3:  24 hits. Notable errors/deficits:  needs BUE support on // bars   Ambulation x 345 ft with  SPC following Blaze pod activity, initially her legs feel locked up and heavy but this improves with increased distance covered.    PATIENT EDUCATION: Education details: continue HEP, PT POC with plan to d/c next visit - can return later in the year with new referral if needed Person educated: Patient Education method: Explanation and Demonstration Education comprehension: verbalized understanding, returned demonstration, and needs further education  HOME EXERCISE PROGRAM: To be reviewed/updated from previous POC: [email or web address]   Access Code: [facility] URL: [email or web address] Date: 05/30/2024 Prepared by: Waddell Southgate  Exercises - Sidelying Open Book Thoracic Lumbar Rotation and Extension  - 1 x daily - 7 x weekly - 1-2 sets - 10 reps - Supine Thoracic Mobilization Foam Roll Horizontal with Arm Stretch  - 1 x daily - 7 x weekly - 1-2 sets - 10 reps - Child's Pose with Sidebending  - 1 x daily - 7 x weekly - 1 sets - 3-5 reps - 30 sec hold - Child's Pose with Thread the Needle  - 1 x daily - 7 x weekly - 1 sets - 3-5 reps - 30 sec hold - Alternating Step Taps with Counter Support  - 1 x daily - 7 x weekly - 3 sets - 10 reps - Supine Lower Trunk Rotation  - 1 x daily - 7 x weekly - 2-3 sets - 10 reps - Half Kneeling Hip Flexor Stretch  - 1 x daily - 7 x weekly - 1 sets - 3-5 reps - 30 sec hold - Heel Sits  - 1 x daily - 7 x weekly - 3 sets - 10 reps - Hooklying Single Knee to Chest Stretch  - 1 x daily - 7 x weekly - 1 sets - 3-5 reps - 30 sec hold - Seated Knee Extension with Resistance  - 1 x daily - 7 x weekly - 3 sets - 10 reps - Seated March with Resistance  - 1 x daily - 7 x weekly - 3 sets - 10 reps - Supine Quad Set  - 1 x daily - 7 x weekly - 3 sets - 10 reps - 5 sec hold - Hooklying Gluteal Sets  - 1 x daily - 7 x weekly - 3 sets - 10 reps - 5 sec hold - Bridge  - 1 x daily - 7 x weekly - 3 sets - 5-10 reps - 5 sec hold - Seated Hip Adduction Isometrics with  Ball  - 1 x daily - 7 x weekly - 3 sets - 10 reps - 5 sec hold - Seated Gluteal Sets  - 1 x daily - 7 x weekly - 3 sets - 10  reps - 5 sec hold  GOALS: Goals reviewed with patient? Yes  SHORT TERM GOALS: Target date: 06/16/2024   Pt will be independent with initial HEP for improved strength and endurance. Baseline: Goal status: MET  2.  Pt will increase her 30 sec sit to stands to 9 reps for improved endurance. Baseline: 7 reps (6/12), 6 reps (7/1) Goal status: NOT MET  3.  Pt will improve gait velocity to at least 2.5 ft/sec for improved gait efficiency and performance at mod I level  Baseline: 2.19 ft/sec no AD (6/12), 2.6 ft/sec no AD (7/1) Goal status: MET  4.  Pt will ambulate greater than or equal to 750 feet on with LRAD and mod I for improved cardiovascular endurance and BLE strength.  Baseline: 635 ft with SPC mod I (6/12), 439 ft with SPC mod I (7/1) Goal status: NOT MET   LONG TERM GOALS: Target date: 07/18/2024 (updated to match last scheduled appt within POC)   Pt will be independent with final HEP for improved strength and endurance. Baseline:  Goal status: IN PROGRESS  2.  Pt will increase her 30 sec sit to stands to 9 reps for improved endurance. Baseline: 7 reps (6/12), 6 reps (7/1), 8 reps (7/18), 9 reps (8/4) Goal status: MET  3.  Pt will improve gait velocity to at least 2.75 ft/sec for improved gait efficiency and performance at mod I level  Baseline: 2.19 ft/sec no AD (6/12), 2.6 ft/sec no AD (7/1), 3.04 ft/sec no AD (7/18) Goal status: MET  4.  Pt will ambulate greater than or equal to 700 feet on with LRAD and mod I for improved cardiovascular endurance and BLE strength.  Baseline: 635 ft with SPC mod I (6/12), 439 ft with SPC mod I (7/1), 687 ft with SPC mod I (7/18), 585 ft with SPC mod I (8/4) Goal status: IN PROGRESS  NEW SHORT TERM GOALS=NEW LONG TERM GOALS due to length of POC   NEW LONG TERM GOALS:  Target date: 08/15/2024  Pt will  be independent with final HEP for improved strength and endurance. Baseline:  Goal status: IN PROGRESS   2.  Pt will ambulate greater than or equal to 700 feet on with LRAD and mod I for improved cardiovascular endurance and BLE strength.  Baseline: 635 ft with SPC mod I (6/12), 439 ft with SPC mod I (7/1), 687 ft with SPC mod I (7/18), 585 ft with SPC mod I (8/4) Goal status: IN PROGRESS    ASSESSMENT:  CLINICAL IMPRESSION: Emphasis of skilled PT session on continuing to assess vitals, working on global endurance training, and working on Editor, commissioning and obstacle navigation. Pt's BP is low again this visit but no signs/symptoms of it being lower this date. She is challenged by dynamic balance and obstacle navigation task this date. Plan to assess LTG and d/c from PT next visit in order to conserve visits for later in the year if needed. Continue POC.   OBJECTIVE IMPAIRMENTS: cardiopulmonary status limiting activity, decreased activity tolerance, decreased endurance, decreased mobility, difficulty walking, decreased strength, impaired perceived functional ability, and pain.   ACTIVITY LIMITATIONS: carrying, lifting, bending, standing, squatting, and stairs  PARTICIPATION LIMITATIONS: community activity and occupation  PERSONAL FACTORS: Time since onset of injury/illness/exacerbation and 3+ comorbidities:  long-Covid, positive ANA, arthralgia, anemia, B12 deficiency, mixed hyperlipidemia, prediabetesare also affecting patient's functional outcome.   REHAB POTENTIAL: Good  CLINICAL DECISION MAKING: Stable/uncomplicated  EVALUATION COMPLEXITY: Low  PLAN:  PT  FREQUENCY: 2x/week + 1x/week (recert)  PT DURATION: 6 weeks + 4 visits (recert)  PLANNED INTERVENTIONS: 02835- PT Re-evaluation, 97750- Physical Performance Testing, 97110-Therapeutic exercises, 97530- Therapeutic activity, [id number]- Neuromuscular re-education, 97535- Self Care, 02859- Manual therapy, [phone]- Gait training,  [phone]- Canalith repositioning, [id number]- Aquatic Therapy, [phone]- Electrical stimulation (manual), [phone] (1-2 muscles), [id number] (3+ muscles)- Dry Needling, Patient/Family education, Balance training, Stair training, Taping, Joint mobilization, Spinal manipulation, Vestibular training, DME instructions, Cryotherapy, and Moist heat  PLAN FOR NEXT SESSION: assess LTG and d/c, can get a new referral to return to PT if needed before end of the year and/or following her potential hysterectomy   Waddell Southgate, PT Waddell Southgate, PT, DPT, CSRS  08/01/2024, 4:27 PM  For all possible CPT codes, reference the Planned Interventions line above.     Check all conditions that are expected to impact treatment: {Conditions expected to impact treatment:Presence of Medical Equipment   If treatment provided at initial evaluation, no treatment charged due to lack of authorization.

## 2024-08-08 ENCOUNTER — Ambulatory Visit: Payer: Self-pay | Admitting: Physical Therapy

## 2024-08-08 VITALS — BP 119/91 | HR 103

## 2024-08-08 DIAGNOSIS — R2689 Other abnormalities of gait and mobility: Secondary | ICD-10-CM | POA: Diagnosis not present

## 2024-08-08 DIAGNOSIS — R5381 Other malaise: Secondary | ICD-10-CM

## 2024-08-08 DIAGNOSIS — M6281 Muscle weakness (generalized): Secondary | ICD-10-CM | POA: Diagnosis not present

## 2024-08-08 NOTE — Therapy (Signed)
 OUTPATIENT PHYSICAL THERAPY NEURO TREATMENT - DISCHARGE NOTE   Patient Name: Kelli Cooper MRN: [id number] DOB:Dec 04, 1981, 43 y.o., female Today's Date: 08/08/2024   PCP: Oley Bascom RAMAN, NP REFERRING PROVIDER: Oley Bascom RAMAN, NP  PHYSICAL THERAPY DISCHARGE SUMMARY  Visits from Start of Care: 16  Current functional level related to goals / functional outcomes: Mod I with SPC   Remaining deficits: Ongoing impaired endurance   Education / Equipment: Handout for final HEP, continue to use [facility]   Patient agrees to discharge. Patient goals were met. Patient is being discharged due to being pleased with the current functional level.      END OF SESSION:  PT End of Session - 08/08/24 1530     Visit Number 16    Number of Visits 18   recert   Date for PT Re-Evaluation 08/22/24   recert   Authorization Type Medicaid [employer]    Authorization Time Period 5 visits approved 07/13/24-09/10/24    Authorization - Number of Visits 5    Progress Note Due on Visit 10    PT Start Time 1530    PT Stop Time 1615    PT Time Calculation (min) 45 min    Equipment Utilized During Treatment Gait belt    Activity Tolerance Patient tolerated treatment well    Behavior During Therapy WFL for tasks assessed/performed                        Past Medical History:  Diagnosis Date   Anemia    Arthralgia    COVID    DM (diabetes mellitus) (HCC)    Iron  deficiency anemia    Muscle pain    Palpitations    Positive ANA (antinuclear antibody)    SOB (shortness of breath)    Past Surgical History:  Procedure Laterality Date   NO PAST SURGERIES     Patient Active Problem List   Diagnosis Date Noted   Low vitamin D  level 04/05/2024   Fibroids 04/04/2024   Heavy menstrual bleeding 02/01/2024   Iron  deficiency anemia 09/17/2023   Positive ANA (antinuclear antibody) 08/06/2023   Muscle pain 07/30/2023    ONSET DATE: 05/11/2024 (referral date)  REFERRING DIAG:  R53.81 (ICD-10-CM) - Physical deconditioning  THERAPY DIAG:  Physical deconditioning  Other abnormalities of gait and mobility  Muscle weakness (generalized)  Rationale for Evaluation and Treatment: Rehabilitation  SUBJECTIVE:  SUBJECTIVE STATEMENT:  Kelli Cooper  Pt has been having a rough few days, physically tired and had insomnia last night. She has been walking [name] (her dog) in the mornings. She continues to work on her stretches and has been walking 2 blocks every day.  Pt does see her PCP this week, will ask about getting a BP cuff.  Pt accompanied by: self  PERTINENT HISTORY: PMH: long-Covid, positive ANA, arthralgia, anemia, B12 deficiency, mixed hyperlipidemia, prediabetes  PAIN:  Are you having pain? Yes: NPRS scale: 3/10 Pain location: all over body Pain description: N/A Aggravating factors: N/A Relieving factors: N/A  PRECAUTIONS: None  RED FLAGS: None   WEIGHT BEARING RESTRICTIONS: No  FALLS: Has patient fallen in last 6 months? No  LIVING ENVIRONMENT: Lives with: lives alone (with her dog) Lives in: House/apartment Stairs: Yes: Internal: 12 steps; on right going up Has following equipment at home: Single point cane  PLOF: Independent with gait, Independent with transfers, and Requires assistive device for independence  PATIENT GOALS: to be able to walk without a cane  OBJECTIVE:  Note: Objective measures were completed at Evaluation unless otherwise noted.  DIAGNOSTIC FINDINGS: None relevant to this POC  COGNITION: Overall cognitive status: Within functional limits for tasks assessed   SENSATION: WFL, no N/T  COORDINATION: WFL  EDEMA:  None per patient report  POSTURE: rounded shoulders and forward head  LOWER EXTREMITY ROM:  WFL   Active   Right Eval Left Eval  Hip flexion    Hip extension    Hip abduction    Hip adduction    Hip internal rotation    Hip external rotation    Knee flexion    Knee extension    Ankle dorsiflexion    Ankle plantarflexion    Ankle inversion    Ankle eversion     (Blank rows = not tested)  LOWER EXTREMITY MMT:    MMT Right Eval Left Eval  Hip flexion 4 4  Hip extension    Hip abduction    Hip adduction    Hip internal rotation    Hip external rotation    Knee flexion 4 4  Knee extension 4 4  Ankle dorsiflexion 4 4  Ankle plantarflexion    Ankle inversion    Ankle eversion    (Blank rows = not tested)  BED MOBILITY:  Mod I per patient report  TRANSFERS: Sit to stand: Modified independence  Assistive device utilized: None     Stand to sit: Modified independence  Assistive device utilized: None     Chair to chair: Modified independence  Assistive device utilized: None       RAMP:  Not tested  CURB:  Not tested  STAIRS: Not tested GAIT: Findings:  Gait pattern: WFL Distance walked: various clinic distances Assistive device utilized: Single point cane Level of assistance: Modified independence Comments: [facility]  TREATMENT:  Self-Care/Home Management: Vitals:   08/08/24 1534 08/08/24 1539  BP: (!) 113/96 (!) 119/91  Pulse: (!) 116 (!) 103   Temperature: 98.2 degrees (pt feeling somewhat hot, feverish), vitals WNL  Initial BP reading obtained is elevated. After 2 min seated rest break re-assessed BP. Second reading still elevated but improved.  TherAct SciFit multi-peaks level 6 for 10 minutes using BUE/BLEs for neural priming for reciprocal movement, dynamic cardiovascular warmup and increased amplitude of stepping. RPE of 8/10 following activity.    For LTG assessment: : 741 ft, RPE 6/10    PATIENT EDUCATION: Education  details: continue HEP, results of OM and functional implications Person educated: Patient Education method: Explanation and Demonstration Education comprehension: verbalized understanding and returned demonstration  HOME EXERCISE PROGRAM: To be reviewed/updated from previous POC: [email or web address]   Access Code: [email or web address] URL: [email or web address] Date: 05/30/2024 Prepared by: Waddell Southgate  Exercises - Sidelying Open Book Thoracic Lumbar Rotation and Extension  - 1 x daily - 7 x weekly - 1-2 sets - 10 reps - Supine Thoracic Mobilization Foam Roll Horizontal with Arm Stretch  - 1 x daily - 7 x weekly - 1-2 sets - 10 reps - Child's Pose with Sidebending  - 1 x daily - 7 x weekly - 1 sets - 3-5 reps - 30 sec hold - Child's Pose with Thread the Needle  - 1 x daily - 7 x weekly - 1 sets - 3-5 reps - 30 sec hold - Alternating Step Taps with Counter Support  - 1 x daily - 7 x weekly - 3 sets - 10 reps - Supine Lower Trunk Rotation  - 1 x daily - 7 x weekly - 2-3 sets - 10 reps - Half Kneeling Hip Flexor Stretch  - 1 x daily - 7 x weekly - 1 sets - 3-5 reps - 30 sec hold - Heel Sits  - 1 x daily - 7 x weekly - 3 sets - 10 reps - Hooklying Single Knee to Chest Stretch  - 1 x daily - 7 x weekly - 1 sets - 3-5 reps - 30 sec hold - Seated Knee Extension with Resistance  - 1 x daily - 7 x weekly - 3 sets - 10 reps - Seated March with Resistance  - 1 x daily - 7 x weekly - 3 sets - 10 reps - Supine Quad Set  - 1 x daily - 7 x weekly - 3 sets - 10 reps - 5 sec hold - Hooklying Gluteal Sets  - 1 x daily - 7 x weekly - 3 sets - 10 reps - 5 sec hold - Bridge  - 1 x daily - 7 x weekly - 3 sets - 5-10 reps - 5 sec hold - Seated Hip Adduction Isometrics with Ball  - 1 x daily - 7 x weekly - 3 sets - 10 reps - 5 sec hold - Seated Gluteal Sets  - 1 x daily - 7 x weekly - 3 sets - 10 reps - 5 sec hold  GOALS: Goals reviewed with patient? Yes  SHORT TERM GOALS: Target date: 06/16/2024   Pt will be  independent with initial HEP for improved strength and endurance. Baseline: Goal status: MET  2.  Pt will increase her 30 sec sit to stands to 9 reps for improved endurance. Baseline: 7 reps (6/12), 6 reps (7/1) Goal status: NOT MET  3.  Pt will improve gait velocity to at least 2.5 ft/sec for improved gait efficiency  and performance at mod I level  Baseline: 2.19 ft/sec no AD (6/12), 2.6 ft/sec no AD (7/1) Goal status: MET  4.  Pt will ambulate greater than or equal to 750 feet on with LRAD and mod I for improved cardiovascular endurance and BLE strength.  Baseline: 635 ft with SPC mod I (6/12), 439 ft with SPC mod I (7/1) Goal status: NOT MET   LONG TERM GOALS: Target date: 07/18/2024 (updated to match last scheduled appt within POC)   Pt will be independent with final HEP for improved strength and endurance. Baseline:  Goal status: IN PROGRESS  2.  Pt will increase her 30 sec sit to stands to 9 reps for improved endurance. Baseline: 7 reps (6/12), 6 reps (7/1), 8 reps (7/18), 9 reps (8/4) Goal status: MET  3.  Pt will improve gait velocity to at least 2.75 ft/sec for improved gait efficiency and performance at mod I level  Baseline: 2.19 ft/sec no AD (6/12), 2.6 ft/sec no AD (7/1), 3.04 ft/sec no AD (7/18) Goal status: MET  4.  Pt will ambulate greater than or equal to 700 feet on with LRAD and mod I for improved cardiovascular endurance and BLE strength.  Baseline: 635 ft with SPC mod I (6/12), 439 ft with SPC mod I (7/1), 687 ft with SPC mod I (7/18), 585 ft with SPC mod I (8/4) Goal status: IN PROGRESS  NEW SHORT TERM GOALS=NEW LONG TERM GOALS due to length of POC   NEW LONG TERM GOALS:  Target date: 08/15/2024  Pt will be independent with final HEP for improved strength and endurance. Baseline:  Goal status: MET   2.  Pt will ambulate greater than or equal to 700 feet on with LRAD and mod I for improved cardiovascular endurance and BLE strength.   Baseline: 635 ft with SPC mod I (6/12), 439 ft with SPC mod I (7/1), 687 ft with SPC mod I (7/18), 585 ft with SPC mod I (8/4), 741 ft with SPC mod I (8/25) Goal status: MET    ASSESSMENT:  CLINICAL IMPRESSION: Emphasis of skilled PT session on continuing to assess vitals, assessing LTG with planned d/c from OPPT this date, and continuing to work on Radiographer, therapeutic. Pt's BP is elevated this date and she is not feeling as well today. She did meet 2/2 LTG this date due to being independent with her final HEP and improving her distance covered during the . She is agreeable to d/c from OPPT services this date and obtain a new referral to return later in the year if needed, will continue with her HEP and daily walks.   OBJECTIVE IMPAIRMENTS: cardiopulmonary status limiting activity, decreased activity tolerance, decreased endurance, decreased mobility, difficulty walking, decreased strength, impaired perceived functional ability, and pain.   ACTIVITY LIMITATIONS: carrying, lifting, bending, standing, squatting, and stairs  PARTICIPATION LIMITATIONS: community activity and occupation  PERSONAL FACTORS: Time since onset of injury/illness/exacerbation and 3+ comorbidities:  long-Covid, positive ANA, arthralgia, anemia, B12 deficiency, mixed hyperlipidemia, prediabetesare also affecting patient's functional outcome.   REHAB POTENTIAL: Good  CLINICAL DECISION MAKING: Stable/uncomplicated  EVALUATION COMPLEXITY: Low  PLAN: discharge from PT   Waddell Southgate, PT Waddell Southgate, PT, DPT, CSRS  08/08/2024, 4:18 PM  For all possible CPT codes, reference the Planned Interventions line above.     Check all conditions that are expected to impact treatment: {Conditions expected to impact treatment:Presence of Medical Equipment   If treatment provided at initial evaluation, no  treatment charged due to lack of authorization.

## 2024-08-11 ENCOUNTER — Encounter: Payer: Self-pay | Admitting: Nurse Practitioner

## 2024-08-11 ENCOUNTER — Ambulatory Visit (INDEPENDENT_AMBULATORY_CARE_PROVIDER_SITE_OTHER): Payer: Self-pay | Admitting: Nurse Practitioner

## 2024-08-11 ENCOUNTER — Other Ambulatory Visit (HOSPITAL_COMMUNITY): Payer: Self-pay

## 2024-08-11 VITALS — BP 132/87 | HR 111 | Temp 98.4°F | Wt 219.0 lb

## 2024-08-11 DIAGNOSIS — E875 Hyperkalemia: Secondary | ICD-10-CM

## 2024-08-11 DIAGNOSIS — R7989 Other specified abnormal findings of blood chemistry: Secondary | ICD-10-CM | POA: Diagnosis not present

## 2024-08-11 DIAGNOSIS — R7303 Prediabetes: Secondary | ICD-10-CM | POA: Diagnosis not present

## 2024-08-11 DIAGNOSIS — Z1329 Encounter for screening for other suspected endocrine disorder: Secondary | ICD-10-CM | POA: Diagnosis not present

## 2024-08-11 DIAGNOSIS — I959 Hypotension, unspecified: Secondary | ICD-10-CM | POA: Diagnosis not present

## 2024-08-11 LAB — POCT GLYCOSYLATED HEMOGLOBIN (HGB A1C): Hemoglobin A1C: 5.3 % (ref 4.0–5.6)

## 2024-08-11 MED ORDER — OMRON 3 SERIES BP MONITOR DEVI
1.0000 [IU] | Freq: Every day | 0 refills | Status: AC
  Filled 2024-08-11 – 2024-08-23 (×2): qty 1, 30d supply, fill #0

## 2024-08-11 NOTE — Progress Notes (Signed)
 Subjective   Patient ID: Kelli Cooper, female    DOB: Aug 28, 1981, 43 y.o.   MRN: [id number]  Chief Complaint  Patient presents with   Medical Management of Chronic Issues    Referring provider: Oley Bascom RAMAN, NP  Kelli Cooper is a 43 y.o. female with Past Medical History: No date: Anemia No date: Arthralgia No date: COVID No date: DM (diabetes mellitus) (HCC) No date: Iron  deficiency anemia No date: Muscle pain No date: Palpitations No date: Positive ANA (antinuclear antibody) No date: SOB (shortness of breath)   HPI  Patient presents today for follow-up visit.  She has followed up with OB/GYN since her last visit here and it was found to have a fibroid.  She does continue to follow with hematology for associated anemia.  She does follow with rheumatology next month for positive ANA.  She does also follow with physical therapy. Denies f/c/s, n/v/d, hemoptysis, PND, leg swelling Denies chest pain or edema    No Known Allergies  Immunization History  Administered Date(s) Administered   PFIZER(Purple Top)SARS-COV-2 Vaccination 03/29/2020, 04/23/2020    Tobacco History: Social History   Tobacco Use  Smoking Status Never  Smokeless Tobacco Never   Counseling given: Not Answered   Outpatient Encounter Medications as of 08/11/2024  Medication Sig   Blood Pressure Monitoring (OMRON 3 SERIES BP MONITOR) DEVI 1 Units by Does not apply route daily.   cyanocobalamin  (VITAMIN B12) 1000 MCG tablet Take 1 tablet (1,000 mcg total) by mouth daily.   ibuprofen  (ADVIL ) 200 MG tablet Take 200 mg by mouth every 6 (six) hours as needed.   Iron , Ferrous Sulfate , 325 (65 Fe) MG TABS Take 325 mg by mouth daily.   norethindrone  (MICRONOR ) 0.35 MG tablet Take 1 tablet (0.35 mg total) by mouth daily.   Vitamin D , Ergocalciferol , (DRISDOL ) 1.25 MG (50000 UNIT) CAPS capsule Take 1 capsule (50,000 Units total) by mouth every 7 (seven) days.   No facility-administered  encounter medications on file as of 08/11/2024.    Review of Systems  Review of Systems  Constitutional: Negative.   HENT: Negative.    Cardiovascular: Negative.   Gastrointestinal: Negative.   Allergic/Immunologic: Negative.   Neurological: Negative.   Psychiatric/Behavioral: Negative.       Objective:   BP 132/87   Pulse (!) 111   Temp 98.4 F (36.9 C) (Oral)   Wt 219 lb (99.3 kg)   SpO2 100%   BMI 38.79 kg/m   Wt Readings from Last 5 Encounters:  08/11/24 219 lb (99.3 kg)  06/28/24 223 lb (101.2 kg)  06/10/24 234 lb 12.8 oz (106.5 kg)  05/27/24 218 lb (98.9 kg)  05/25/24 220 lb 6.4 oz (100 kg)     Physical Exam Vitals and nursing note reviewed.  Constitutional:      General: She is not in acute distress.    Appearance: She is well-developed.  Cardiovascular:     Rate and Rhythm: Normal rate and regular rhythm.  Pulmonary:     Effort: Pulmonary effort is normal.     Breath sounds: Normal breath sounds.  Neurological:     Mental Status: She is alert and oriented to person, place, and time.       Assessment & Plan:   Prediabetes -     POCT glycosylated hemoglobin (Hb A1C)  Hypotension, unspecified hypotension type -     Omron 3 Series BP Monitor; 1 Units by Does not apply route daily.  Dispense: 1 each; Refill:  0  Hyperkalemia -     Comprehensive metabolic panel with GFR  Elevated TSH -     Thyroid  Panel With TSH     Return in about 6 months (around 02/11/2025).     Bascom GORMAN Borer, NP 08/11/2024

## 2024-08-12 ENCOUNTER — Ambulatory Visit: Payer: Self-pay | Admitting: Nurse Practitioner

## 2024-08-12 DIAGNOSIS — F4312 Post-traumatic stress disorder, chronic: Secondary | ICD-10-CM | POA: Diagnosis not present

## 2024-08-12 LAB — COMPREHENSIVE METABOLIC PANEL WITH GFR
ALT: 11 IU/L (ref 0–32)
AST: 14 IU/L (ref 0–40)
Albumin: 4.4 g/dL (ref 3.9–4.9)
Alkaline Phosphatase: 55 IU/L (ref 44–121)
BUN/Creatinine Ratio: 17 (ref 9–23)
BUN: 11 mg/dL (ref 6–24)
Bilirubin Total: 0.2 mg/dL (ref 0.0–1.2)
CO2: 20 mmol/L (ref 20–29)
Calcium: 9.5 mg/dL (ref 8.7–10.2)
Chloride: 106 mmol/L (ref 96–106)
Creatinine, Ser: 0.64 mg/dL (ref 0.57–1.00)
Globulin, Total: 2.6 g/dL (ref 1.5–4.5)
Glucose: 92 mg/dL (ref 70–99)
Potassium: 4.5 mmol/L (ref 3.5–5.2)
Sodium: 141 mmol/L (ref 134–144)
Total Protein: 7 g/dL (ref 6.0–8.5)
eGFR: 111 mL/min/1.73 (ref >59)

## 2024-08-12 LAB — THYROID PANEL WITH TSH
Free Thyroxine Index: 1.8 (ref 1.2–4.9)
T3 Uptake Ratio: 22 % — ABNORMAL LOW (ref 24–39)
T4, Total: 8.2 ug/dL (ref 4.5–12.0)
TSH: 2.71 u[IU]/mL (ref 0.450–4.500)

## 2024-08-21 ENCOUNTER — Other Ambulatory Visit (HOSPITAL_COMMUNITY): Payer: Self-pay

## 2024-08-23 ENCOUNTER — Inpatient Hospital Stay

## 2024-08-23 ENCOUNTER — Other Ambulatory Visit (HOSPITAL_COMMUNITY): Payer: Self-pay

## 2024-08-23 ENCOUNTER — Other Ambulatory Visit: Payer: Self-pay | Admitting: *Deleted

## 2024-08-23 ENCOUNTER — Other Ambulatory Visit: Payer: Self-pay

## 2024-08-23 DIAGNOSIS — D500 Iron deficiency anemia secondary to blood loss (chronic): Secondary | ICD-10-CM

## 2024-08-23 DIAGNOSIS — E119 Type 2 diabetes mellitus without complications: Secondary | ICD-10-CM | POA: Insufficient documentation

## 2024-08-23 DIAGNOSIS — N920 Excessive and frequent menstruation with regular cycle: Secondary | ICD-10-CM | POA: Insufficient documentation

## 2024-08-23 LAB — CBC WITH DIFFERENTIAL (CANCER CENTER ONLY)
Abs Immature Granulocytes: 0.01 K/uL (ref 0.00–0.07)
Basophils Absolute: 0.1 K/uL (ref 0.0–0.1)
Basophils Relative: 1 %
Eosinophils Absolute: 0.2 K/uL (ref 0.0–0.5)
Eosinophils Relative: 3 %
HCT: 35.9 % — ABNORMAL LOW (ref 36.0–46.0)
Hemoglobin: 12.2 g/dL (ref 12.0–15.0)
Immature Granulocytes: 0 %
Lymphocytes Relative: 24 %
Lymphs Abs: 1.5 K/uL (ref 0.7–4.0)
MCH: 28 pg (ref 26.0–34.0)
MCHC: 34 g/dL (ref 30.0–36.0)
MCV: 82.3 fL (ref 80.0–100.0)
Monocytes Absolute: 0.5 K/uL (ref 0.1–1.0)
Monocytes Relative: 7 %
Neutro Abs: 4.1 K/uL (ref 1.7–7.7)
Neutrophils Relative %: 65 %
Platelet Count: 249 K/uL (ref 150–400)
RBC: 4.36 MIL/uL (ref 3.87–5.11)
RDW: 13 % (ref 11.5–15.5)
WBC Count: 6.3 K/uL (ref 4.0–10.5)
nRBC: 0 % (ref 0.0–0.2)

## 2024-08-23 LAB — IRON AND IRON BINDING CAPACITY (CC-WL,HP ONLY)
Iron: 49 ug/dL (ref 28–170)
Saturation Ratios: 15 % (ref 10.4–31.8)
TIBC: 330 ug/dL (ref 250–450)
UIBC: 281 ug/dL (ref 148–442)

## 2024-08-24 LAB — FERRITIN: Ferritin: 26 ng/mL (ref 11–307)

## 2024-08-24 NOTE — Progress Notes (Unsigned)
 [facility] OFFICE PROGRESS NOTE  Patient Care Team: Oley Bascom RAMAN, NP as PCP - General (Pulmonary Disease)  43 y.o.female with history of heavy menstrual cycles, diabetes here for follow up for iron  deficiency anemia.   Relevant history: Heavy menstrual bleeding, fibroids Last colonoscopy: Not yet Last EGD: None yet   Patient received 1 dose of Monoferric  on 11/30/2023.  Hemoglobin improved slightly. Continued with oral iron . Hgb improved. Still iron  deficiency. She had feraheme on 6/4 and 6/13. Ferritin is 26 in Aug.  Clinically feeling better.  Planning on hysterectomy in the near future.  Assessment & Plan Iron  deficiency anemia due to chronic blood loss Repeat labs in about 3 months Resume oral iron  once daily Follow-up 1 to 2 days after lab draw Follow-up earlier if need to be seen and repeat lab earlier due to heavy bleeding  Orders Placed This Encounter  Procedures   CBC with Differential (Cancer Center Only)    Standing Status:   Future    Expiration Date:   08/25/2025   Iron  and Iron  Binding Capacity ([facility] only)    Standing Status:   Future    Expiration Date:   08/25/2025   Ferritin    Standing Status:   Future    Expiration Date:   08/25/2025     Pauletta JAYSON Chihuahua, MD  INTERVAL HISTORY: Patient returns for follow-up. Report feeling better after last infusion.  Patient has history of heavy menstrual bleeding and planning on hysterectomy. She still has heavy menstrual cycles changing multiple pads a day. Report heavier for about 4-5 days. She has been on birth control for 5-6 months.   No bloody stool or dark stool.  No restless leg or ice craving.  PHYSICAL EXAMINATION:  VSS  Conjunctiva normal Skin color normal.  Relevant data reviewed during this visit included labs.  New labs ordered.

## 2024-08-25 ENCOUNTER — Inpatient Hospital Stay (HOSPITAL_BASED_OUTPATIENT_CLINIC_OR_DEPARTMENT_OTHER)

## 2024-08-25 DIAGNOSIS — D500 Iron deficiency anemia secondary to blood loss (chronic): Secondary | ICD-10-CM | POA: Diagnosis not present

## 2024-08-25 NOTE — Assessment & Plan Note (Signed)
 Repeat labs in about 3 months Resume oral iron  once daily Follow-up 1 to 2 days after lab draw Follow-up earlier if need to be seen and repeat lab earlier due to heavy bleeding

## 2024-08-26 DIAGNOSIS — F4312 Post-traumatic stress disorder, chronic: Secondary | ICD-10-CM | POA: Diagnosis not present
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
## 2024-09-23 DIAGNOSIS — F4312 Post-traumatic stress disorder, chronic: Secondary | ICD-10-CM | POA: Diagnosis not present

## 2024-09-29 ENCOUNTER — Other Ambulatory Visit (HOSPITAL_BASED_OUTPATIENT_CLINIC_OR_DEPARTMENT_OTHER)

## 2024-09-29 ENCOUNTER — Ambulatory Visit: Payer: Self-pay

## 2024-09-29 ENCOUNTER — Other Ambulatory Visit (HOSPITAL_BASED_OUTPATIENT_CLINIC_OR_DEPARTMENT_OTHER): Payer: Self-pay

## 2024-09-29 DIAGNOSIS — E559 Vitamin D deficiency, unspecified: Secondary | ICD-10-CM

## 2024-09-29 NOTE — Telephone Encounter (Signed)
 Please advise pt if we have any earlier appts. Thanks [employer]

## 2024-09-29 NOTE — Telephone Encounter (Signed)
 FYI Only or Action Required?: Action required by provider: update on patient condition. Appt made but asking if muscle relaxer could be called in before.  Patient was last seen in primary care on 08/11/2024 by Oley Bascom RAMAN, NP.  Called Nurse Triage reporting Back Pain.  Symptoms began several years ago.  Interventions attempted: OTC medications: ibuprofen .  Symptoms are: gradually worsening.  Triage Disposition: See PCP When Office is Open (Within 3 Days)  Patient/caregiver understands and will follow disposition?:   Copied from CRM #[id number]. Topic: Clinical - Red Word Triage >> Sep 29, 2024 11:54 AM Avram MATSU wrote: Red Word that prompted transfer to Nurse Triage: back is painful/sore Reason for Disposition  [1] MODERATE back pain (e.g., interferes with normal activities) AND [2] present > 3 days  Answer Assessment - Initial Assessment Questions Long Covid with ongoing muscle tension issues. Has been worse lately with increase in car rides and appts causing it to feel like screws turning into her muscles instead of just normal tension.  Heating pad, hot showers, ibuprofen  - PT developed a stretching routine for her and she gets moderate low impact exercise.   Asking if a one time order of a muscle relaxer may help. She does not want to be on long term meds due to family history if addiction. She would like to see if the muscle relaxer helps her just reset the tension/pain. Not interested in Tramadol . Appt made for 10/30 (waitlisted) due to conflicting appointments but would like to see if Bascom Oley would be open to sending something for her to trial until then.   ED/UC/Call back instructions given and understood.  1. ONSET: When did the pain begin? (e.g., minutes, hours, days)     Worsened today but ongoing with long COVID last 3 years. 2. LOCATION: Where does it hurt? (upper, mid or lower back)     Entire back and arms  3. SEVERITY: How bad is the pain?  (e.g., Scale  1-10; mild, moderate, or severe)     8/10 4. PATTERN: Is the pain constant? (e.g., yes, no; constant, intermittent)      Worse with car ride, muscle tension with long covid 5. RADIATION: Does the pain shoot into your legs or somewhere else?     Full back and arms  6. CAUSE:  What do you think is causing the back pain?      LONG COVID issues 7. BACK OVERUSE:  Any recent lifting of heavy objects, strenuous work or exercise?     Sitting for long periods in a car makes it worse 8. MEDICINES: What have you taken so far for the pain? (e.g., nothing, acetaminophen , NSAIDS)     Ibuprofen   9. NEUROLOGIC SYMPTOMS: Do you have any weakness, numbness, or problems with bowel/bladder control?     denies 10. OTHER SYMPTOMS: Do you have any other symptoms? (e.g., fever, abdomen pain, burning with urination, blood in urine)       denies  Protocols used: Back Pain-A-AH

## 2024-09-30 LAB — VITAMIN D 25 HYDROXY (VIT D DEFICIENCY, FRACTURES): Vit D, 25-Hydroxy: 25.2 ng/mL — ABNORMAL LOW (ref 30.0–100.0)

## 2024-10-06 ENCOUNTER — Ambulatory Visit: Payer: Self-pay | Admitting: Nurse Practitioner

## 2024-10-06 ENCOUNTER — Ambulatory Visit: Attending: Rheumatology | Admitting: Rheumatology

## 2024-10-06 ENCOUNTER — Telehealth: Payer: Self-pay

## 2024-10-06 ENCOUNTER — Encounter: Payer: Self-pay | Admitting: Rheumatology

## 2024-10-06 VITALS — BP 121/87 | HR 77 | Temp 98.2°F | Resp 16 | Ht 63.0 in | Wt 218.6 lb

## 2024-10-06 DIAGNOSIS — R7689 Other specified abnormal immunological findings in serum: Secondary | ICD-10-CM | POA: Diagnosis not present

## 2024-10-06 DIAGNOSIS — D508 Other iron deficiency anemias: Secondary | ICD-10-CM | POA: Insufficient documentation

## 2024-10-06 DIAGNOSIS — D219 Benign neoplasm of connective and other soft tissue, unspecified: Secondary | ICD-10-CM | POA: Insufficient documentation

## 2024-10-06 DIAGNOSIS — N920 Excessive and frequent menstruation with regular cycle: Secondary | ICD-10-CM | POA: Diagnosis not present

## 2024-10-06 DIAGNOSIS — L700 Acne vulgaris: Secondary | ICD-10-CM | POA: Insufficient documentation

## 2024-10-06 DIAGNOSIS — R7989 Other specified abnormal findings of blood chemistry: Secondary | ICD-10-CM | POA: Insufficient documentation

## 2024-10-06 DIAGNOSIS — U099 Post covid-19 condition, unspecified: Secondary | ICD-10-CM | POA: Insufficient documentation

## 2024-10-06 DIAGNOSIS — M791 Myalgia, unspecified site: Secondary | ICD-10-CM | POA: Insufficient documentation

## 2024-10-06 DIAGNOSIS — F4310 Post-traumatic stress disorder, unspecified: Secondary | ICD-10-CM | POA: Insufficient documentation

## 2024-10-06 NOTE — Telephone Encounter (Signed)
 Patient was seen today as new patient and per Dr. Dolphus, if labs are negative, we can cancel the new patient follow up.

## 2024-10-07 DIAGNOSIS — F4312 Post-traumatic stress disorder, chronic: Secondary | ICD-10-CM | POA: Diagnosis not present

## 2024-10-08 LAB — BETA-2 GLYCOPROTEIN ANTIBODIES
Beta-2 Glyco 1 IgA: <2 U/mL (ref <20.0)
Beta-2 Glyco 1 IgM: 5.1 U/mL (ref <20.0)
Beta-2 Glyco I IgG: <2 U/mL (ref <20.0)

## 2024-10-08 LAB — SJOGRENS SYNDROME-B EXTRACTABLE NUCLEAR ANTIBODY: SSB (La) (ENA) Antibody, IgG: <1 AI

## 2024-10-08 LAB — CARDIOLIPIN ANTIBODIES, IGG, IGM, IGA
Anticardiolipin IgA: 2.3 [APL'U]/mL (ref <20.0)
Anticardiolipin IgG: <2 [GPL'U]/mL (ref <20.0)
Anticardiolipin IgM: 7.6 [MPL'U]/mL (ref <20.0)

## 2024-10-08 LAB — C3 AND C4
C3 Complement: 125 mg/dL (ref 83–193)
C4 Complement: 21 mg/dL (ref 15–57)

## 2024-10-08 LAB — ANA: Anti Nuclear Antibody (ANA): NEGATIVE

## 2024-10-08 LAB — ANTI-DNA ANTIBODY, DOUBLE-STRANDED: ds DNA Ab: 3 [IU]/mL

## 2024-10-08 LAB — ANTI-SCLERODERMA ANTIBODY: Scleroderma (Scl-70) (ENA) Antibody, IgG: <1 AI

## 2024-10-08 LAB — CK: Total CK: 45 U/L (ref 20–239)

## 2024-10-08 LAB — RNP ANTIBODY: Ribonucleic Protein(ENA) Antibody, IgG: <1 AI

## 2024-10-08 LAB — ANTI-SMITH ANTIBODY: ENA SM Ab Ser-aCnc: <1 AI

## 2024-10-08 LAB — SJOGRENS SYNDROME-A EXTRACTABLE NUCLEAR ANTIBODY: SSA (Ro) (ENA) Antibody, IgG: 5 AI — AB

## 2024-10-10 ENCOUNTER — Ambulatory Visit: Payer: Self-pay | Admitting: Rheumatology

## 2024-10-10 NOTE — Progress Notes (Signed)
 SSA antibody which is associated with Sjogren's is positive.  All other labs are negative.  I will discuss results at the follow-up visit.

## 2024-10-10 NOTE — Telephone Encounter (Signed)
 Labs: 10/06/2024 SSA antibody which is associated with Sjogren's is positive. All other labs are negative. I will discuss results at the follow-up visit.   Follow up: 11/02/2024

## 2024-10-11 ENCOUNTER — Telehealth (HOSPITAL_BASED_OUTPATIENT_CLINIC_OR_DEPARTMENT_OTHER): Payer: Self-pay

## 2024-10-11 NOTE — Telephone Encounter (Signed)
 Patient called and left message on machine wanting to know what the next plan of treatment is for her. She states that she needs to have a hysterectomy. She states that she is severely anemic and needs to have iron  infusions. Please advise. tbw

## 2024-10-13 ENCOUNTER — Encounter: Payer: Self-pay | Admitting: Nurse Practitioner

## 2024-10-13 ENCOUNTER — Ambulatory Visit (INDEPENDENT_AMBULATORY_CARE_PROVIDER_SITE_OTHER): Payer: Self-pay | Admitting: Nurse Practitioner

## 2024-10-13 ENCOUNTER — Other Ambulatory Visit (HOSPITAL_COMMUNITY): Payer: Self-pay

## 2024-10-13 VITALS — BP 102/73 | HR 70 | Wt 222.6 lb

## 2024-10-13 DIAGNOSIS — G8929 Other chronic pain: Secondary | ICD-10-CM

## 2024-10-13 DIAGNOSIS — M545 Low back pain, unspecified: Secondary | ICD-10-CM

## 2024-10-13 DIAGNOSIS — R29898 Other symptoms and signs involving the musculoskeletal system: Secondary | ICD-10-CM | POA: Diagnosis not present

## 2024-10-13 DIAGNOSIS — R5381 Other malaise: Secondary | ICD-10-CM | POA: Diagnosis not present

## 2024-10-13 MED ORDER — CYCLOBENZAPRINE HCL 10 MG PO TABS
10.0000 mg | ORAL_TABLET | Freq: Three times a day (TID) | ORAL | 0 refills | Status: AC | PRN
  Filled 2024-10-13 (×2): qty 30, 10d supply, fill #0

## 2024-10-13 MED ORDER — KETOROLAC TROMETHAMINE 60 MG/2ML IM SOLN
60.0000 mg | Freq: Once | INTRAMUSCULAR | Status: AC
  Administered 2024-10-13: 60 mg via INTRAMUSCULAR

## 2024-10-13 NOTE — Progress Notes (Signed)
 Subjective   Patient ID: Kelli Cooper, female    DOB: [birth year], 43 y.o.   MRN: [id number]  Chief Complaint  Patient presents with   Back Pain    Started almost 4 years ago and has become increasingly worse, varies in intensity, intermittent, prior treatment has been ibuprofen      Referring provider: Oley Bascom RAMAN, NP  Kelli Cooper is a 43 y.o. female with Past Medical History: No date: Anemia No date: Arthralgia No date: COVID No date: DM (diabetes mellitus) (HCC) No date: Iron  deficiency anemia No date: Muscle pain No date: Palpitations No date: Positive ANA (antinuclear antibody) No date: SOB (shortness of breath)   HPI   Patient presents today for an acute visit.  She states that she has been having back pain for the past 4 years which is progressively worsening.  She is having leg weakness.  She was previously in physical therapy which was helping but did recently have to quit.  She does want a referral back to physical therapy today.  We will trial Toradol injection.  We will trial Flexeril .  We will place a referral to neurosurgery. Denies f/c/s, n/v/d, hemoptysis, PND, leg swelling Denies chest pain or edema'   No Known Allergies  Immunization History  Administered Date(s) Administered   PFIZER(Purple Top)SARS-COV-2 Vaccination 03/29/2020, 04/23/2020    Tobacco History: Social History   Tobacco Use  Smoking Status Never   Passive exposure: Past  Smokeless Tobacco Never   Counseling given: Not Answered   Outpatient Encounter Medications as of 10/13/2024  Medication Sig   Blood Pressure Monitoring (OMRON 3 SERIES BP MONITOR) DEVI Use daily as directed   cyanocobalamin  (VITAMIN B12) 1000 MCG tablet Take 1 tablet (1,000 mcg total) by mouth daily.   cyclobenzaprine  (FLEXERIL ) 10 MG tablet Take 1 tablet (10 mg total) by mouth 3 (three) times daily as needed for muscle spasms.   ibuprofen  (ADVIL ) 200 MG tablet Take 200 mg by mouth every 6  (six) hours as needed.   Iron , Ferrous Sulfate , 325 (65 Fe) MG TABS Take 325 mg by mouth daily.   norethindrone  (MICRONOR ) 0.35 MG tablet Take 1 tablet (0.35 mg total) by mouth daily.   Vitamin D , Ergocalciferol , (DRISDOL ) 1.25 MG (50000 UNIT) CAPS capsule Take 1 capsule (50,000 Units total) by mouth every 7 (seven) days.   [EXPIRED] ketorolac (TORADOL) injection 60 mg    No facility-administered encounter medications on file as of 10/13/2024.    Review of Systems  Review of Systems  Constitutional: Negative.   HENT: Negative.    Cardiovascular: Negative.   Gastrointestinal: Negative.   Musculoskeletal:  Positive for back pain.  Allergic/Immunologic: Negative.   Neurological: Negative.   Psychiatric/Behavioral: Negative.       Objective:   BP 102/73 (BP Location: Right Arm, Patient Position: Sitting, Cuff Size: Large)   Pulse 70   Wt 222 lb 9.6 oz (101 kg)   SpO2 99%   BMI 39.43 kg/m   Wt Readings from Last 5 Encounters:  10/13/24 222 lb 9.6 oz (101 kg)  10/06/24 218 lb 9.6 oz (99.2 kg)  08/11/24 219 lb (99.3 kg)  06/28/24 223 lb (101.2 kg)  06/10/24 234 lb 12.8 oz (106.5 kg)     Physical Exam Vitals and nursing note reviewed.  Constitutional:      General: She is not in acute distress.    Appearance: She is well-developed.  Cardiovascular:     Rate and Rhythm: Regular rhythm.  Pulmonary:  Effort: Pulmonary effort is normal.     Breath sounds: Normal breath sounds.  Neurological:     Mental Status: She is alert and oriented to person, place, and time.       Assessment & Plan:   Physical deconditioning -     Ambulatory referral to Physical Therapy -     Ketorolac Tromethamine  Chronic bilateral low back pain without sciatica -     Ambulatory referral to Physical Therapy -     Ketorolac Tromethamine -     Ambulatory referral to Neurosurgery  Bilateral leg weakness -     Ambulatory referral to Neurosurgery  Other orders -     Cyclobenzaprine   HCl; Take 1 tablet (10 mg total) by mouth 3 (three) times daily as needed for muscle spasms.  Dispense: 30 tablet; Refill: 0     Return if symptoms worsen or fail to improve.     Bascom GORMAN Borer, NP 10/13/2024

## 2024-10-18 ENCOUNTER — Telehealth (HOSPITAL_BASED_OUTPATIENT_CLINIC_OR_DEPARTMENT_OTHER): Payer: Self-pay

## 2024-10-18 NOTE — Telephone Encounter (Signed)
 Pt stated that she was seen back in July and was told she needed to get some injections and no one ever got back to her about them.  Would like for someone to give her a call.

## 2024-10-18 NOTE — Telephone Encounter (Signed)
 Dr.Miller,  OV 06/28/2024- US  showing uterus measuring 12.6 x 10.8 x 9.2 cm with 8.2 cm fibroid and additional 2.8 cm and 4.6 cm fibroid. Iron  level was 167, Ferritin was 73 at that visit. Per note patient will need an EMB once a more definite surgery plan is made.  Patient had labs on 08/23/24 with Dr.Chang Iron  was 49 and TIBC was 330. At 08/25/24 appointment with Dr.Change was advised to repeat labs in 3 months and resume oral iron  once daily.  Patient is calling regarding additional iron  infusions and injections. Please advise.

## 2024-10-19 ENCOUNTER — Telehealth: Payer: Self-pay | Admitting: Nurse Practitioner

## 2024-10-19 NOTE — Progress Notes (Deleted)
 Office Visit Note  Patient: Kelli Cooper             Date of Birth: 15-Feb-1981           MRN: [id number]             PCP: Oley Bascom RAMAN, NP Referring: Oley Bascom RAMAN, NP Visit Date: 11/02/2024 Occupation: voice lessons  Subjective:  No chief complaint on file.   History of Present Illness: Kelli Cooper is a 43 y.o. female ***     Activities of Daily Living:  Patient reports morning stiffness for *** {minute/hour:19697}.   Patient {ACTIONS;DENIES/REPORTS:21021675::Denies} nocturnal pain.  Difficulty dressing/grooming: {ACTIONS;DENIES/REPORTS:21021675::Denies} Difficulty climbing stairs: {ACTIONS;DENIES/REPORTS:21021675::Denies} Difficulty getting out of chair: {ACTIONS;DENIES/REPORTS:21021675::Denies} Difficulty using hands for taps, buttons, cutlery, and/or writing: {ACTIONS;DENIES/REPORTS:21021675::Denies}  No Rheumatology ROS completed.   PMFS History:  Patient Active Problem List   Diagnosis Date Noted   Low vitamin D  level 04/05/2024   Fibroids 04/04/2024   Heavy menstrual bleeding 02/01/2024   Iron  deficiency anemia 09/17/2023   Positive ANA (antinuclear antibody) 08/06/2023   Muscle pain 07/30/2023    Past Medical History:  Diagnosis Date   Anemia    Arthralgia    COVID    DM (diabetes mellitus) (HCC)    Iron  deficiency anemia    Muscle pain    Palpitations    Positive ANA (antinuclear antibody)    SOB (shortness of breath)     Family History  Problem Relation Age of Onset   Healthy Mother    Healthy Father    Healthy Sister    Past Surgical History:  Procedure Laterality Date   arm surgery Left    Social History   Tobacco Use   Smoking status: Never    Passive exposure: Past   Smokeless tobacco: Never  Vaping Use   Vaping status: Former  Substance Use Topics   Alcohol use: Not Currently   Drug use: Yes    Types: Marijuana    Comment: daily   Social History   Social History Narrative   Not on file      Immunization History  Administered Date(s) Administered   PFIZER(Purple Top)SARS-COV-2 Vaccination 03/29/2020, 04/23/2020     Objective: Vital Signs: There were no vitals taken for this visit.   Physical Exam   Musculoskeletal Exam: ***  CDAI Exam: CDAI Score: -- Patient Global: --; Provider Global: -- Swollen: --; Tender: -- Joint Exam 11/02/2024   No joint exam has been documented for this visit   There is currently no information documented on the homunculus. Go to the Rheumatology activity and complete the homunculus joint exam.  Investigation: No additional findings.  Imaging: No results found.  Recent Labs: Lab Results  Component Value Date   WBC 6.3 08/23/2024   HGB 12.2 08/23/2024   PLT 249 08/23/2024   NA 141 08/11/2024   K 4.5 08/11/2024   CL 106 08/11/2024   CO2 20 08/11/2024   GLUCOSE 92 08/11/2024   BUN 11 08/11/2024   CREATININE 0.64 08/11/2024   BILITOT 0.2 08/11/2024   ALKPHOS 55 08/11/2024   AST 14 08/11/2024   ALT 11 08/11/2024   PROT 7.0 08/11/2024   ALBUMIN 4.4 08/11/2024   CALCIUM  9.5 08/11/2024   GFRAA >60 02/04/2018   October 06, 2024 ANA negative, SSA 5.0, (SSB, dsDNA, Smith, RNP, SCL 70 negative), C3-C4 normal, anticardiolipin negative, beta-2 GP 1 negative, CK 45  Speciality Comments: No specialty comments available.  Procedures:  No procedures performed Allergies:  Patient has no known allergies.   Assessment / Plan:     Visit Diagnoses: No diagnosis found.  Orders: No orders of the defined types were placed in this encounter.  No orders of the defined types were placed in this encounter.   Face-to-face time spent with patient was *** minutes. Greater than 50% of time was spent in counseling and coordination of care.  Follow-Up Instructions: No follow-ups on file.   Maya Nash, MD  Note - This record has been created using Animal nutritionist.  Chart creation errors have been sought, but may not always   have been located. Such creation errors do not reflect on  the standard of medical care.

## 2024-10-19 NOTE — Telephone Encounter (Signed)
 Copied from CRM [id number]. Topic: Clinical - Medical Advice >> Oct 19, 2024 10:06 AM Lonell PEDLAR wrote: Reason for CRM: Patient tried muscle relaxers, would like to advise provider that they work well. However, they do make her sleepy. She is back to ibuprofen , feeling better. Only using muscle relaxers as needed. Does not think she needs pain management at this time.

## 2024-10-20 NOTE — Telephone Encounter (Signed)
 Dr.Miller,  Patient left a voicemail on the nurse line inquiring about next steps. Please advise.  Thank you, Kelli Cooper

## 2024-10-21 DIAGNOSIS — F4312 Post-traumatic stress disorder, chronic: Secondary | ICD-10-CM | POA: Diagnosis not present

## 2024-11-02 ENCOUNTER — Ambulatory Visit: Attending: Nurse Practitioner | Admitting: Physical Therapy

## 2024-11-02 ENCOUNTER — Ambulatory Visit: Admitting: Rheumatology

## 2024-11-02 DIAGNOSIS — G8929 Other chronic pain: Secondary | ICD-10-CM | POA: Diagnosis not present

## 2024-11-02 DIAGNOSIS — M6281 Muscle weakness (generalized): Secondary | ICD-10-CM | POA: Insufficient documentation

## 2024-11-02 DIAGNOSIS — F4310 Post-traumatic stress disorder, unspecified: Secondary | ICD-10-CM

## 2024-11-02 DIAGNOSIS — N920 Excessive and frequent menstruation with regular cycle: Secondary | ICD-10-CM

## 2024-11-02 DIAGNOSIS — M545 Low back pain, unspecified: Secondary | ICD-10-CM | POA: Insufficient documentation

## 2024-11-02 DIAGNOSIS — D508 Other iron deficiency anemias: Secondary | ICD-10-CM

## 2024-11-02 DIAGNOSIS — R2689 Other abnormalities of gait and mobility: Secondary | ICD-10-CM | POA: Diagnosis present

## 2024-11-02 DIAGNOSIS — M542 Cervicalgia: Secondary | ICD-10-CM | POA: Diagnosis present

## 2024-11-02 DIAGNOSIS — M791 Myalgia, unspecified site: Secondary | ICD-10-CM

## 2024-11-02 DIAGNOSIS — R5381 Other malaise: Secondary | ICD-10-CM | POA: Insufficient documentation

## 2024-11-02 DIAGNOSIS — U099 Post covid-19 condition, unspecified: Secondary | ICD-10-CM

## 2024-11-02 DIAGNOSIS — L700 Acne vulgaris: Secondary | ICD-10-CM

## 2024-11-02 DIAGNOSIS — R7989 Other specified abnormal findings of blood chemistry: Secondary | ICD-10-CM

## 2024-11-02 DIAGNOSIS — D219 Benign neoplasm of connective and other soft tissue, unspecified: Secondary | ICD-10-CM

## 2024-11-02 DIAGNOSIS — R7689 Other specified abnormal immunological findings in serum: Secondary | ICD-10-CM

## 2024-11-02 NOTE — Therapy (Signed)
 OUTPATIENT PHYSICAL THERAPY NEURO EVALUATION   Patient Name: Kelli Cooper MRN: [id number] DOB:Apr 30, 1981, 43 y.o., female Today's Date: 11/03/2024   PCP: Oley Bascom RAMAN, NP REFERRING PROVIDER: Oley Bascom RAMAN, NP  END OF SESSION:  PT End of Session - 11/02/24 1406     Visit Number 1    Number of Visits 8   with eval   Date for Recertification  12/14/24   recert   Authorization Type Medicaid [employer]    Authorization Time Period --    Authorization - Number of Visits --    Progress Note Due on Visit --    PT Start Time 1405    PT Stop Time 1445    PT Time Calculation (min) 40 min    Equipment Utilized During Treatment --    Activity Tolerance Patient tolerated treatment well    Behavior During Therapy WFL for tasks assessed/performed          Past Medical History:  Diagnosis Date   Anemia    Arthralgia    COVID    DM (diabetes mellitus) (HCC)    Iron  deficiency anemia    Muscle pain    Palpitations    Positive ANA (antinuclear antibody)    SOB (shortness of breath)    Past Surgical History:  Procedure Laterality Date   arm surgery Left    Patient Active Problem List   Diagnosis Date Noted   Low vitamin D  level 04/05/2024   Fibroids 04/04/2024   Heavy menstrual bleeding 02/01/2024   Iron  deficiency anemia 09/17/2023   Positive ANA (antinuclear antibody) 08/06/2023   Muscle pain 07/30/2023    ONSET DATE: 10/13/2024 (referral date)  REFERRING DIAG: R53.81 (ICD-10-CM) - Physical deconditioning M54.50,G89.29 (ICD-10-CM) - Chronic bilateral low back pain without sciatica  THERAPY DIAG:  Physical deconditioning  Cervicalgia  Muscle weakness (generalized)  Rationale for Evaluation and Treatment: Habilitation  SUBJECTIVE:                                                                                                                                                                                             SUBJECTIVE  STATEMENT: Kelli Cooper  Pt familiar to this clinic, last seen by this therapist June-August 2025 and has been seen for multiple POCs since 2024 with this therapist.  Pt reports that she is still waiting to her from her ObGyn about her surgery so no new updates regarding that. She saw a rheumatologist and it was confirmed that she doesn't have lupus.  Pt reports that since last being seen at this clinic her pain has gotten so bad that she reached out  to her PCP. She was prescribed muscle relaxers that were helpful but they do make her drowsy, only takes them if needed. She continues to take ibuprofen  as needed. She has realized when she is not in pain she has more energy. She can be referred to a pain management specialist if needed. Right now she is interested in stretches for pain management. Currently her pain is in the back of her neck, down into her shoulder area, and into her upper arms and upper back.  She also has been referred to neurosurgery, goes to see them 12/1.  She has been walking [name] every morning and finds the stretches from last PT POC are still helpful. Denies any falls.  Pt accompanied by: self  PERTINENT HISTORY: PMH: long-Covid, positive ANA, arthralgia, anemia, B12 deficiency, mixed hyperlipidemia, prediabetes   PAIN:  Are you having pain? Yes: NPRS scale: 4/10 (now), 11/10 at the worst Pain location: neck, upper shoulders, upper arms, upper back Pain description: feels like screws are twisting Aggravating factors: nothing specific - quit doing anything that flared it up but doesn't remember anything specific Relieving factors: muscle relaxers, ibuprofen , stretching, heating pad  PRECAUTIONS: None  RED FLAGS: None   WEIGHT BEARING RESTRICTIONS: No  FALLS: Has patient fallen in last 6 months? No  LIVING ENVIRONMENT: Lives with: lives alone, with their dog [name] Lives in: House/apartment Stairs: Yes: Internal: 12 steps; on right going up Has following  equipment at home: Single point cane  PLOF: Independent with gait, Independent with transfers, and Requires assistive device for independence  PATIENT GOALS: pain management long term goal of being able to walk in the woods with a cane  OBJECTIVE:  Note: Objective measures were completed at Evaluation unless otherwise noted.  DIAGNOSTIC FINDINGS: None updated or relevant to this POC  COGNITION: Overall cognitive status: Within functional limits for tasks assessed   SENSATION: WFL  COORDINATION: WFL  EDEMA:  None  POSTURE: rounded shoulders and forward head  PALPATION: tightness in cervical paraspinals, thoracic paraspinals, rhomboids, and around shoulder blades with TTP in rhomboids and mid-trap region  CERVICAL ROM:   Active ROM A/PROM (deg) eval  Pain  Flexion 55 -  Extension 55 -  Right lateral flexion 25 Center and to the L - tightness  Left lateral flexion 20 Center and to the R - tightness  Right rotation 50 -  Left rotation 50 -   (Blank rows = not tested)   UPPER EXTREMITY ROM:  Active ROM Right eval Left eval  Shoulder flexion WFL, pain below shoulder blades at end range WFL, pain below shoulder blades at end range  Shoulder extension    Shoulder abduction WFL, pain in pecs at end range [facility], pain in pecs at end range  Shoulder adduction    Shoulder extension    Shoulder internal rotation WFL, tension at end range WFL, tension at end range  Shoulder external rotation [facility]  Elbow flexion    Elbow extension    Wrist flexion    Wrist extension    Wrist ulnar deviation    Wrist radial deviation    Wrist pronation    Wrist supination     (Blank rows = not tested)   UPPER EXTREMITY MMT:  MMT Right eval Left eval  Shoulder flexion 5 5  Shoulder extension    Shoulder abduction 5 5  Shoulder adduction    Shoulder extension    Shoulder internal rotation    Shoulder external rotation  Middle trapezius 3 3  Lower trapezius 3 3  Elbow  flexion    Elbow extension    Wrist flexion    Wrist extension    Wrist ulnar deviation    Wrist radial deviation    Wrist pronation    Wrist supination    Grip strength     (Blank rows = not tested)    CERVICAL SPECIAL TESTS:  None assessed at eval  BED MOBILITY:  Mod I  TRANSFERS: Sit to stand: Modified independence  Assistive device utilized: Single point cane     Stand to sit: Modified independence  Assistive device utilized: Single point cane     Chair to chair: Modified independence  Assistive device utilized: Single point cane        GAIT: Findings: WFL  FUNCTIONAL TESTS:  None assessed at eval  PATIENT SURVEYS:  NDI: 32/50                                                                                                                              TREATMENT DATE: PT Evaluation  To address muscle pain/tightness in cervical region and decreased B shoulder IR ROM: Supine suboccipital release on tennis balls x 5 min Provided tennis balls to patient, encouraged her to work up to 5-10 min Seated B shoulder IR with cane behind back x 10 reps  Verbally added to HEP, see bolded below    PATIENT EDUCATION: Education details: Eval findings, PT POC, initial HEP Person educated: Patient Education method: Explanation, Demonstration, Tactile cues, and Verbal cues Education comprehension: verbalized understanding, returned demonstration, and needs further education  HOME EXERCISE PROGRAM: Access Code: [email or web address] URL: [email or web address] Date: 11/03/2024 Prepared by: Waddell Southgate  Exercises - Supine Suboccipital Release with Tennis Balls  - 1 x daily - 7 x weekly - 1 sets - 1 reps - 5-10 min hold - Standing Bilateral Shoulder Internal Rotation AAROM with Dowel  - 1 x daily - 7 x weekly - 3 sets - 10 reps  GOALS: Goals reviewed with patient? Yes  SHORT TERM GOALS: Target date: 11/24/2024   Pt will be independent with initial HEP for improved  cervical ROM, improved posture and management of pain symptoms in order to build upon functional gains made in therapy. Baseline: Goal status: INITIAL   LONG TERM GOALS: Target date: 12/14/2024   Pt will be independent with final HEP for improved cervical ROM, improved posture and management of pain symptoms in order to build upon functional gains made in therapy. Baseline:  Goal status: INITIAL  2.  Pt will increase her bilateral lateral cervical flexion by >/= 10 degrees for improved function. Baseline:  CERVICAL ROM:   Active ROM AROM (deg) Eval 11/12/2024  Pain  Right lateral flexion 25 Center and to the L - tightness  Left lateral flexion 20 Center and to the R - tightness    Goal status: INITIAL  3.  Pt will improve her score on  the NDI to </= 27/50 for decreased pain and improved function Baseline: 32/50 (11/19) Goal status: INITIAL  4.  Pt will increase her mid and lower trap strength to 4/5 for improved function and decreased pain Baseline: 3/5 (11/19) Goal status: INITIAL    ASSESSMENT:  CLINICAL IMPRESSION: Patient is a 43 year old female referred to Neuro OPPT for chronic pain and deconditioning.   Pt's PMH is significant for: long-Covid, positive ANA, arthralgia, anemia, B12 deficiency, mixed hyperlipidemia, prediabetes. The following deficits were present during the exam: decreased cervical AROM, decreased muscle strength, and increased pain and disability with impaired function based on NDI score. Pt would benefit from skilled PT to address these impairments and functional limitations to maximize functional mobility independence.   OBJECTIVE IMPAIRMENTS: decreased activity tolerance, decreased knowledge of condition, decreased ROM, decreased strength, increased fascial restrictions, impaired perceived functional ability, impaired UE functional use, improper body mechanics, postural dysfunction, and pain.   ACTIVITY LIMITATIONS: carrying, lifting, and reach  over head  PARTICIPATION LIMITATIONS: meal prep, cleaning, laundry, driving, shopping, community activity, occupation, and yard work  PERSONAL FACTORS: Time since onset of injury/illness/exacerbation, Transportation, and 3+ comorbidities:  long-Covid, positive ANA, arthralgia, anemia, B12 deficiency, mixed hyperlipidemia, prediabetesare also affecting patient's functional outcome.   REHAB POTENTIAL: Good  CLINICAL DECISION MAKING: Stable/uncomplicated  EVALUATION COMPLEXITY: Low  PLAN:  PT FREQUENCY: 1x/week  PT DURATION: 8 weeks  PLANNED INTERVENTIONS: 97164- PT Re-evaluation, 97750- Physical Performance Testing, 97110-Therapeutic exercises, 97530- Therapeutic activity, [id number]- Neuromuscular re-education, 97535- Self Care, 02859- Manual therapy, [id number]- Aquatic Therapy, [id number]- Electrical stimulation (manual), [phone] (1-2 muscles), 20561 (3+ muscles)- Dry Needling, Patient/Family education, Taping, Joint mobilization, Spinal mobilization, Cryotherapy, and Moist heat  PLAN FOR NEXT SESSION: how is initial HEP? Cervical distraction, chin tucks, IYT postural stabilization, scap squeezes, lat pull downs, resisted shoulder doorways exercises, UT and levator scap stretches, ER   Waddell Southgate, PT Waddell Southgate, PT, DPT, CSRS  11/03/2024, 8:44 AM  For all possible CPT codes, reference the Planned Interventions line above.     Check all conditions that are expected to impact treatment: {Conditions expected to impact treatment:Medical complications related to COVID-19 and Presence of Medical Equipment   If treatment provided at initial evaluation, no treatment charged due to lack of authorization.

## 2024-11-04 DIAGNOSIS — F4312 Post-traumatic stress disorder, chronic: Secondary | ICD-10-CM | POA: Diagnosis not present

## 2024-11-04 NOTE — Progress Notes (Signed)
 Referring Physician:  Oley Bascom RAMAN, NP [address] [place],  [place] [address]  Primary Physician:  Kelli Bascom RAMAN, NP  History of Present Illness: 11/14/2024 Kelli Cooper is here today with chronic back pain and bilateral lower extremity weakness.  The majority of her pain is actually in her neck and thoracic spine, but she is concerned that both of her legs feel heavy and she is had difficulty walking as a result.  This is equal in both legs.  She has had to use a cane to ambulate.  She feels as though her legs are heavy and they feel like Jell-O.  She does have pins-and-needles in her legs only when sitting  for prolonged period of time.  This began a few years ago after having COVID.  She denies any issues with balance.  No saddle anesthesia.  Bowel/Bladder Dysfunction: none  Conservative measures:  Physical therapy:  Has participated in 08/2023-10/2023, 05/2024-07/2024. Resumed 10/2024 on going. Multimodal medical therapy including regular antiinflammatories:  Cyclobenzaprine , Toradol , ibuprofen   Injections:  epidural steroid injections?  Past Surgery:   Kelli Cooper has no symptoms of cervical myelopathy.  The symptoms are causing a significant impact on the patient's life.   Review of Systems:  A 10 point review of systems is negative, except for the pertinent positives and negatives detailed in the HPI.  Past Medical History: Past Medical History:  Diagnosis Date   Anemia    Arthralgia    COVID    DM (diabetes mellitus) (HCC)    Iron  deficiency anemia    Muscle pain    Palpitations    Positive ANA (antinuclear antibody)    SOB (shortness of breath)     Past Surgical History: Past Surgical History:  Procedure Laterality Date   arm surgery Left     Allergies: Allergies as of 11/14/2024   (No Known Allergies)    Medications: Outpatient Encounter Medications as of 11/14/2024  Medication Sig   Blood Pressure Monitoring (OMRON  3 SERIES BP MONITOR) DEVI Use daily as directed   cyanocobalamin  (VITAMIN B12) 1000 MCG tablet Take 1 tablet (1,000 mcg total) by mouth daily.   cyclobenzaprine  (FLEXERIL ) 10 MG tablet Take 1 tablet (10 mg total) by mouth 3 (three) times daily as needed for muscle spasms.   ibuprofen  (ADVIL ) 200 MG tablet Take 200 mg by mouth every 6 (six) hours as needed.   Iron , Ferrous Sulfate , 325 (65 Fe) MG TABS Take 325 mg by mouth daily.   norethindrone  (MICRONOR ) 0.35 MG tablet Take 1 tablet (0.35 mg total) by mouth daily.   Vitamin D , Ergocalciferol , (DRISDOL ) 1.25 MG (50000 UNIT) CAPS capsule Take 1 capsule (50,000 Units total) by mouth every 7 (seven) days.   No facility-administered encounter medications on file as of 11/14/2024.    Social History: Social History   Tobacco Use   Smoking status: Never    Passive exposure: Past   Smokeless tobacco: Never  Vaping Use   Vaping status: Former  Substance Use Topics   Alcohol use: Not Currently   Drug use: Yes    Types: Marijuana    Comment: daily    Family Medical History: Family History  Problem Relation Age of Onset   Healthy Mother    Healthy Father    Healthy Sister     Physical Examination: @VITALWITHPAIN @  General: Patient is well developed, well nourished, calm, collected, and in no apparent distress. Attention to examination is appropriate.  Psychiatric: Patient  is non-anxious.  Head:  Pupils equal, round, and reactive to light.  ENT:  Oral mucosa appears well hydrated.  Neck:   Supple.  Full range of motion.  Respiratory: Patient is breathing without any difficulty.  Extremities: No edema.  Vascular: Palpable dorsal pedal pulses.  Skin:   On exposed skin, there are no abnormal skin lesions.  NEUROLOGICAL:     Awake, alert, oriented to person, place, and time.  Speech is clear and fluent. Fund of knowledge is appropriate.   Cranial Nerves: Pupils equal round and reactive to light.  Facial tone is symmetric.    ROM of spine: Tenderness to palpation of patient's paraspinals.    Strength:  Side Iliopsoas Quads Hamstring PF DF EHL  R 4+ 5 5 5 5 5   L 4+ 5 5 5 5 5    Reflexes are 1+ bilateral patella, absent Achilles bilaterally. Clonus is not present.  Toes are down-going.  Bilateral upper and lower extremity sensation is intact to light touch.    Patient is walking with a cane.  Medical Decision Making  Imaging:  No recent spine imaging able to be reviewed.  Assessment and Plan: [name] is a pleasant 43 y.o. female with chronic back pain whose main concern today is bilateral leg weakness.  This began after COVID and patient now has to use a cane to walk secondary to her legs feeling heavy and weak.  This has persisted despite physical therapy.  I do have some concerns for neurogenic claudication.  Plan includes the following moving forward:  -Due to progressive weakness that has not been improved with physical therapy, I have ordered a MRI of her lumbar spine to evaluate for stenosis.  Will review results once complete. - X-rays today to include flexion-extension to evaluate for listhesis - Would consider imaging of her cervical or thoracic spine in the future as well as a referral to neurology for other possible causes of her bilateral lower extremity weakness pending imaging. - Plan to follow-up in approximately 8 weeks.  Thank you for involving me in the care of this patient.     Kelli Decamp, PA-C Dept. of Neurosurgery

## 2024-11-07 NOTE — Telephone Encounter (Signed)
 Spoke with patient. Advised prior authorization for [employer] Lupron has been initiated and we are awaiting further information regarding coverage. Patient verbalizes understanding.

## 2024-11-07 NOTE — Telephone Encounter (Signed)
 PA initiated via [employer].

## 2024-11-08 NOTE — Telephone Encounter (Signed)
 See telephone encounter dated 10/18/2024

## 2024-11-09 ENCOUNTER — Ambulatory Visit: Payer: Self-pay | Admitting: Physical Therapy

## 2024-11-09 DIAGNOSIS — R5381 Other malaise: Secondary | ICD-10-CM

## 2024-11-09 DIAGNOSIS — M542 Cervicalgia: Secondary | ICD-10-CM

## 2024-11-09 DIAGNOSIS — M6281 Muscle weakness (generalized): Secondary | ICD-10-CM

## 2024-11-09 DIAGNOSIS — R2689 Other abnormalities of gait and mobility: Secondary | ICD-10-CM

## 2024-11-09 NOTE — Therapy (Signed)
 OUTPATIENT PHYSICAL THERAPY NEURO TREATMENT   Patient Name: Kelli Cooper MRN: [id number] DOB:1981/10/15, 43 y.o., female Today's Date: 11/09/2024   PCP: Oley Bascom RAMAN, NP REFERRING PROVIDER: Oley Bascom RAMAN, NP  END OF SESSION:  PT End of Session - 11/09/24 1149     Visit Number 2    Number of Visits 8   with eval   Date for Recertification  12/14/24   recert   Authorization Type Medicaid [employer]    PT Start Time 1148    PT Stop Time 1230    PT Time Calculation (min) 42 min    Activity Tolerance Patient tolerated treatment well    Behavior During Therapy WFL for tasks assessed/performed           Past Medical History:  Diagnosis Date   Anemia    Arthralgia    COVID    DM (diabetes mellitus) (HCC)    Iron  deficiency anemia    Muscle pain    Palpitations    Positive ANA (antinuclear antibody)    SOB (shortness of breath)    Past Surgical History:  Procedure Laterality Date   arm surgery Left    Patient Active Problem List   Diagnosis Date Noted   Low vitamin D  level 04/05/2024   Fibroids 04/04/2024   Heavy menstrual bleeding 02/01/2024   Iron  deficiency anemia 09/17/2023   Positive ANA (antinuclear antibody) 08/06/2023   Muscle pain 07/30/2023    ONSET DATE: 10/13/2024 (referral date)  REFERRING DIAG: R53.81 (ICD-10-CM) - Physical deconditioning M54.50,G89.29 (ICD-10-CM) - Chronic bilateral low back pain without sciatica  THERAPY DIAG:  Physical deconditioning  Cervicalgia  Muscle weakness (generalized)  Other abnormalities of gait and mobility  Rationale for Evaluation and Treatment: Habilitation  SUBJECTIVE:                                                                                                                                                                                             SUBJECTIVE STATEMENT: Kelli Cooper  Pt has incorporated her new exercises into her routine. Pt reports that her neck feels tight and tense  today. Kelli Cooper did pull a little bit more this morning than he usually does.  Pt accompanied by: self  PERTINENT HISTORY: PMH: long-Covid, positive ANA, arthralgia, anemia, B12 deficiency, mixed hyperlipidemia, prediabetes   PAIN:  Are you having pain? Yes: NPRS scale: 4/10 (now), 11/10 at the worst Pain location: neck, upper shoulders, upper arms, upper back Pain description: feels like screws are twisting Aggravating factors: nothing specific - quit doing anything that flared it up but doesn't remember anything specific Relieving factors: muscle relaxers,  ibuprofen , stretching, heating pad  PRECAUTIONS: None  RED FLAGS: None   WEIGHT BEARING RESTRICTIONS: No  FALLS: Has patient fallen in last 6 months? No  LIVING ENVIRONMENT: Lives with: lives alone, with their dog Kelli Cooper Lives in: House/apartment Stairs: Yes: Internal: 12 steps; on right going up Has following equipment at home: Single point cane  PLOF: Independent with gait, Independent with transfers, and Requires assistive device for independence  PATIENT GOALS: pain management long term goal of being able to walk in the woods with a cane  OBJECTIVE:  Note: Objective measures were completed at Evaluation unless otherwise noted.  DIAGNOSTIC FINDINGS: None updated or relevant to this POC  COGNITION: Overall cognitive status: Within functional limits for tasks assessed   SENSATION: WFL  COORDINATION: WFL  EDEMA:  None  POSTURE: rounded shoulders and forward head  PALPATION: tightness in cervical paraspinals, thoracic paraspinals, rhomboids, and around shoulder blades with TTP in rhomboids and mid-trap region  CERVICAL ROM:   Active ROM A/PROM (deg) eval  Pain  Flexion 55 -  Extension 55 -  Right lateral flexion 25 Center and to the L - tightness  Left lateral flexion 20 Center and to the R - tightness  Right rotation 50 -  Left rotation 50 -   (Blank rows = not tested)   UPPER EXTREMITY  ROM:  Active ROM Right eval Left eval  Shoulder flexion WFL, pain below shoulder blades at end range WFL, pain below shoulder blades at end range  Shoulder extension    Shoulder abduction WFL, pain in pecs at end range [facility], pain in pecs at end range  Shoulder adduction    Shoulder extension    Shoulder internal rotation WFL, tension at end range WFL, tension at end range  Shoulder external rotation [facility]  Elbow flexion    Elbow extension    Wrist flexion    Wrist extension    Wrist ulnar deviation    Wrist radial deviation    Wrist pronation    Wrist supination     (Blank rows = not tested)   UPPER EXTREMITY MMT:  MMT Right eval Left eval  Shoulder flexion 5 5  Shoulder extension    Shoulder abduction 5 5  Shoulder adduction    Shoulder extension    Shoulder internal rotation    Shoulder external rotation    Middle trapezius 3 3  Lower trapezius 3 3  Elbow flexion    Elbow extension    Wrist flexion    Wrist extension    Wrist ulnar deviation    Wrist radial deviation    Wrist pronation    Wrist supination    Grip strength     (Blank rows = not tested)    CERVICAL SPECIAL TESTS:  None assessed at eval  BED MOBILITY:  Mod I  TRANSFERS: Sit to stand: Modified independence  Assistive device utilized: Single point cane     Stand to sit: Modified independence  Assistive device utilized: Single point cane     Chair to chair: Modified independence  Assistive device utilized: Single point cane        GAIT: Findings: WFL  FUNCTIONAL TESTS:  None assessed at eval  PATIENT SURVEYS:  NDI: 32/50  TREATMENT DATE:   TherAct To address muscle pain/tightness in cervical region: Seated UT stretch 3 x 30 sec each B Seated levator scap stretch 3 x 30 sec B Seated cervical distraction x 5 reps with 10-15 sec hold To work on periscapular  strengthening for improved postural control: Prone shoulder flexion 2 x 10 reps B Prone shoulder scaption 2 x 10 reps B Prone scap retract 2 x 10 reps B Prone shoulder extension 2 x 10 reps B  Verbally added to HEP, see bolded below  SciFit multi-peaks level 3 for 8 minutes using BUE/BLEs for neural priming for reciprocal movement, dynamic cardiovascular warmup and increased amplitude of stepping.     PATIENT EDUCATION: Education details: continue HEP and added to HEP Person educated: Patient Education method: Explanation, Demonstration, Tactile cues, and Verbal cues Education comprehension: verbalized understanding, returned demonstration, and needs further education  HOME EXERCISE PROGRAM: Access Code: [facility] URL: [email or web address] Date: 11/03/2024 Prepared by: Waddell Southgate  Exercises - Supine Suboccipital Release with Tennis Balls  - 1 x daily - 7 x weekly - 1 sets - 1 reps - 5-10 min hold - Standing Bilateral Shoulder Internal Rotation AAROM with Dowel  - 1 x daily - 7 x weekly - 3 sets - 10 reps - Seated Upper Trapezius Stretch  - 1 x daily - 7 x weekly - 1 sets - 3-5 reps - 30 sec hold - Gentle Levator Scapulae Stretch  - 1 x daily - 7 x weekly - 1 sets - 3-5 reps - 30 sec hold - Seated Cervical Traction  - 1 x daily - 7 x weekly - 1 sets - 10 reps - 10-15 sec hold - Prone Shoulder Flexion  - 1 x daily - 7 x weekly - 3 sets - 10 reps - Prone Single Arm Shoulder Y  - 1 x daily - 7 x weekly - 3 sets - 10 reps - Prone W Scapular Retraction  - 1 x daily - 7 x weekly - 3 sets - 10 reps - Prone Scapular Slide with Shoulder Extension  - 1 x daily - 7 x weekly - 3 sets - 10 reps  GOALS: Goals reviewed with patient? Yes  SHORT TERM GOALS: Target date: 11/24/2024   Pt will be independent with initial HEP for improved cervical ROM, improved posture and management of pain symptoms in order to build upon functional gains made in therapy. Baseline: Goal  status: INITIAL   LONG TERM GOALS: Target date: 12/14/2024   Pt will be independent with final HEP for improved cervical ROM, improved posture and management of pain symptoms in order to build upon functional gains made in therapy. Baseline:  Goal status: INITIAL  2.  Pt will increase her bilateral lateral cervical flexion by >/= 10 degrees for improved function. Baseline:  CERVICAL ROM:   Active ROM AROM (deg) Eval 11/12/2024  Pain  Right lateral flexion 25 Center and to the L - tightness  Left lateral flexion 20 Center and to the R - tightness    Goal status: INITIAL  3.  Pt will improve her score on the NDI to </= 27/50 for decreased pain and improved function Baseline: 32/50 (11/19) Goal status: INITIAL  4.  Pt will increase her mid and lower trap strength to 4/5 for improved function and decreased pain Baseline: 3/5 (11/19) Goal status: INITIAL    ASSESSMENT:  CLINICAL IMPRESSION: Emphasis of skilled PT session on trialing various other stretches for her cervical and upper shoulder region  as well as working on periscapular strengthening exercises. Pt is challenged by periscapular strengthening exercises this visit. Added stretches and strengthening exercises to her HEP. Pt declines a handout, added to her Medbridge app. She continues to benefit from skilled PT services to work towards improving her strength and ROM in order to increase her management of her pain symptoms and improve her function. Continue POC.    OBJECTIVE IMPAIRMENTS: decreased activity tolerance, decreased knowledge of condition, decreased ROM, decreased strength, increased fascial restrictions, impaired perceived functional ability, impaired UE functional use, improper body mechanics, postural dysfunction, and pain.   ACTIVITY LIMITATIONS: carrying, lifting, and reach over head  PARTICIPATION LIMITATIONS: meal prep, cleaning, laundry, driving, shopping, community activity, occupation, and yard  work  PERSONAL FACTORS: Time since onset of injury/illness/exacerbation, Transportation, and 3+ comorbidities:  long-Covid, positive ANA, arthralgia, anemia, B12 deficiency, mixed hyperlipidemia, prediabetesare also affecting patient's functional outcome.   REHAB POTENTIAL: Good  CLINICAL DECISION MAKING: Stable/uncomplicated  EVALUATION COMPLEXITY: Low  PLAN:  PT FREQUENCY: 1x/week  PT DURATION: 8 weeks  PLANNED INTERVENTIONS: 97164- PT Re-evaluation, 97750- Physical Performance Testing, 97110-Therapeutic exercises, 97530- Therapeutic activity, [id number]- Neuromuscular re-education, 97535- Self Care, [id number]- Manual therapy, [id number]- Aquatic Therapy, [id number]- Electrical stimulation (manual), [phone] (1-2 muscles), 20561 (3+ muscles)- Dry Needling, Patient/Family education, Taping, Joint mobilization, Spinal mobilization, Cryotherapy, and Moist heat  PLAN FOR NEXT SESSION: how is HEP? chin tucks, IYT postural stabilization (in sitting, with dumbbells), scap squeezes, lat pull downs, resisted shoulder doorways exercises, UT and levator scap stretches, cupping? TPDN?   Waddell Southgate, PT Waddell Southgate, PT, DPT, CSRS  11/09/2024, 12:34 PM  For all possible CPT codes, reference the Planned Interventions line above.     Check all conditions that are expected to impact treatment: {Conditions expected to impact treatment:Medical complications related to COVID-19 and Presence of Medical Equipment   If treatment provided at initial evaluation, no treatment charged due to lack of authorization.

## 2024-11-14 ENCOUNTER — Ambulatory Visit

## 2024-11-14 ENCOUNTER — Ambulatory Visit: Admitting: Physician Assistant

## 2024-11-14 ENCOUNTER — Encounter: Payer: Self-pay | Admitting: Physician Assistant

## 2024-11-14 VITALS — BP 110/80 | Wt 219.2 lb

## 2024-11-14 DIAGNOSIS — M549 Dorsalgia, unspecified: Secondary | ICD-10-CM | POA: Diagnosis not present

## 2024-11-14 DIAGNOSIS — R29898 Other symptoms and signs involving the musculoskeletal system: Secondary | ICD-10-CM | POA: Diagnosis not present

## 2024-11-14 DIAGNOSIS — M47816 Spondylosis without myelopathy or radiculopathy, lumbar region: Secondary | ICD-10-CM | POA: Diagnosis not present

## 2024-11-14 DIAGNOSIS — G8929 Other chronic pain: Secondary | ICD-10-CM

## 2024-11-14 DIAGNOSIS — M51369 Other intervertebral disc degeneration, lumbar region without mention of lumbar back pain or lower extremity pain: Secondary | ICD-10-CM | POA: Diagnosis not present

## 2024-11-14 DIAGNOSIS — M48061 Spinal stenosis, lumbar region without neurogenic claudication: Secondary | ICD-10-CM | POA: Diagnosis not present

## 2024-11-16 NOTE — Telephone Encounter (Signed)
 Attempted to reach patient. Unable to leave voicemail as mailbox is full. Wanted to let her know that [facility] will be contacting her to review her coverage for Lupron and discuss shipment to the office.

## 2024-11-17 ENCOUNTER — Telehealth: Payer: Self-pay | Admitting: Physical Therapy

## 2024-11-17 ENCOUNTER — Ambulatory Visit: Payer: Self-pay | Attending: Nurse Practitioner | Admitting: Physical Therapy

## 2024-11-17 DIAGNOSIS — R2689 Other abnormalities of gait and mobility: Secondary | ICD-10-CM | POA: Insufficient documentation

## 2024-11-17 DIAGNOSIS — M542 Cervicalgia: Secondary | ICD-10-CM | POA: Insufficient documentation

## 2024-11-17 DIAGNOSIS — M6281 Muscle weakness (generalized): Secondary | ICD-10-CM | POA: Insufficient documentation

## 2024-11-17 DIAGNOSIS — R5381 Other malaise: Secondary | ICD-10-CM | POA: Insufficient documentation

## 2024-11-17 NOTE — Therapy (Incomplete)
 OUTPATIENT PHYSICAL THERAPY NEURO TREATMENT   Patient Name: Kelli Cooper MRN: [id number] DOB:07/20/1981, 43 y.o., female Today's Date: 11/17/2024   PCP: Oley Bascom RAMAN, NP REFERRING PROVIDER: Oley Bascom RAMAN, NP  END OF SESSION:     Past Medical History:  Diagnosis Date   Anemia    Arthralgia    COVID    DM (diabetes mellitus) (HCC)    Iron  deficiency anemia    Muscle pain    Palpitations    Positive ANA (antinuclear antibody)    SOB (shortness of breath)    Past Surgical History:  Procedure Laterality Date   arm surgery Left    Patient Active Problem List   Diagnosis Date Noted   Low vitamin D  level 04/05/2024   Fibroids 04/04/2024   Heavy menstrual bleeding 02/01/2024   Iron  deficiency anemia 09/17/2023   Positive ANA (antinuclear antibody) 08/06/2023   Muscle pain 07/30/2023    ONSET DATE: 10/13/2024 (referral date)  REFERRING DIAG: R53.81 (ICD-10-CM) - Physical deconditioning M54.50,G89.29 (ICD-10-CM) - Chronic bilateral low back pain without sciatica  THERAPY DIAG:  No diagnosis found.  Rationale for Evaluation and Treatment: Habilitation  SUBJECTIVE:                                                                                                                                                                                             SUBJECTIVE STATEMENT: Kelli Cooper  Pt has incorporated her new exercises into her routine. Pt reports that her neck feels tight and tense today. [name] did pull a little bit more this morning than he usually does.  ***  Pt accompanied by: self  PERTINENT HISTORY: PMH: long-Covid, positive ANA, arthralgia, anemia, B12 deficiency, mixed hyperlipidemia, prediabetes   PAIN:  Are you having pain? Yes: NPRS scale: 4/10 (now), 11/10 at the worst Pain location: neck, upper shoulders, upper arms, upper back Pain description: feels like screws are twisting Aggravating factors: nothing specific - quit doing anything  that flared it up but doesn't remember anything specific Relieving factors: muscle relaxers, ibuprofen , stretching, heating pad  PRECAUTIONS: None  RED FLAGS: None   WEIGHT BEARING RESTRICTIONS: No  FALLS: Has patient fallen in last 6 months? No  LIVING ENVIRONMENT: Lives with: lives alone, with their dog [name] Lives in: House/apartment Stairs: Yes: Internal: 12 steps; on right going up Has following equipment at home: Single point cane  PLOF: Independent with gait, Independent with transfers, and Requires assistive device for independence  PATIENT GOALS: pain management long term goal of being able to walk in the woods with a cane  OBJECTIVE:  Note: Objective measures were completed at Evaluation  unless otherwise noted.  DIAGNOSTIC FINDINGS: None updated or relevant to this POC  COGNITION: Overall cognitive status: Within functional limits for tasks assessed   SENSATION: WFL  COORDINATION: WFL  EDEMA:  None  POSTURE: rounded shoulders and forward head  PALPATION: tightness in cervical paraspinals, thoracic paraspinals, rhomboids, and around shoulder blades with TTP in rhomboids and mid-trap region  CERVICAL ROM:   Active ROM A/PROM (deg) eval  Pain  Flexion 55 -  Extension 55 -  Right lateral flexion 25 Center and to the L - tightness  Left lateral flexion 20 Center and to the R - tightness  Right rotation 50 -  Left rotation 50 -   (Blank rows = not tested)   UPPER EXTREMITY ROM:  Active ROM Right eval Left eval  Shoulder flexion WFL, pain below shoulder blades at end range WFL, pain below shoulder blades at end range  Shoulder extension    Shoulder abduction WFL, pain in pecs at end range [facility], pain in pecs at end range  Shoulder adduction    Shoulder extension    Shoulder internal rotation WFL, tension at end range WFL, tension at end range  Shoulder external rotation [facility]  Elbow flexion    Elbow extension    Wrist flexion    Wrist  extension    Wrist ulnar deviation    Wrist radial deviation    Wrist pronation    Wrist supination     (Blank rows = not tested)   UPPER EXTREMITY MMT:  MMT Right eval Left eval  Shoulder flexion 5 5  Shoulder extension    Shoulder abduction 5 5  Shoulder adduction    Shoulder extension    Shoulder internal rotation    Shoulder external rotation    Middle trapezius 3 3  Lower trapezius 3 3  Elbow flexion    Elbow extension    Wrist flexion    Wrist extension    Wrist ulnar deviation    Wrist radial deviation    Wrist pronation    Wrist supination    Grip strength     (Blank rows = not tested)    CERVICAL SPECIAL TESTS:  None assessed at eval  BED MOBILITY:  Mod I  TRANSFERS: Sit to stand: Modified independence  Assistive device utilized: Single point cane     Stand to sit: Modified independence  Assistive device utilized: Single point cane     Chair to chair: Modified independence  Assistive device utilized: Single point cane        GAIT: Findings: WFL  FUNCTIONAL TESTS:  None assessed at eval  PATIENT SURVEYS:  NDI: 32/50                                                                                                                              TREATMENT DATE:   TherAct To address muscle pain/tightness in cervical region: Seated UT stretch 3 x 30 sec each  B Seated levator scap stretch 3 x 30 sec B Seated cervical distraction x 5 reps with 10-15 sec hold To work on periscapular strengthening for improved postural control: Prone shoulder flexion 2 x 10 reps B Prone shoulder scaption 2 x 10 reps B Prone scap retract 2 x 10 reps B Prone shoulder extension 2 x 10 reps B  Verbally added to HEP, see bolded below  SciFit multi-peaks level 3 for 8 minutes using BUE/BLEs for neural priming for reciprocal movement, dynamic cardiovascular warmup and increased amplitude of stepping.  ***    PATIENT EDUCATION: Education details: continue HEP and added  to HEP*** Person educated: Patient Education method: Explanation, Demonstration, Tactile cues, and Verbal cues Education comprehension: verbalized understanding, returned demonstration, and needs further education  HOME EXERCISE PROGRAM: Access Code: [facility] URL: [email or web address] Date: 11/03/2024 Prepared by: Waddell Southgate  Exercises - Supine Suboccipital Release with Tennis Balls  - 1 x daily - 7 x weekly - 1 sets - 1 reps - 5-10 min hold - Standing Bilateral Shoulder Internal Rotation AAROM with Dowel  - 1 x daily - 7 x weekly - 3 sets - 10 reps - Seated Upper Trapezius Stretch  - 1 x daily - 7 x weekly - 1 sets - 3-5 reps - 30 sec hold - Gentle Levator Scapulae Stretch  - 1 x daily - 7 x weekly - 1 sets - 3-5 reps - 30 sec hold - Seated Cervical Traction  - 1 x daily - 7 x weekly - 1 sets - 10 reps - 10-15 sec hold - Prone Shoulder Flexion  - 1 x daily - 7 x weekly - 3 sets - 10 reps - Prone Single Arm Shoulder Y  - 1 x daily - 7 x weekly - 3 sets - 10 reps - Prone W Scapular Retraction  - 1 x daily - 7 x weekly - 3 sets - 10 reps - Prone Scapular Slide with Shoulder Extension  - 1 x daily - 7 x weekly - 3 sets - 10 reps  GOALS: Goals reviewed with patient? Yes  SHORT TERM GOALS: Target date: 11/24/2024   Pt will be independent with initial HEP for improved cervical ROM, improved posture and management of pain symptoms in order to build upon functional gains made in therapy. Baseline: Goal status: INITIAL   LONG TERM GOALS: Target date: 12/14/2024   Pt will be independent with final HEP for improved cervical ROM, improved posture and management of pain symptoms in order to build upon functional gains made in therapy. Baseline:  Goal status: INITIAL  2.  Pt will increase her bilateral lateral cervical flexion by >/= 10 degrees for improved function. Baseline:  CERVICAL ROM:   Active ROM AROM (deg) Eval 11/12/2024  Pain  Right lateral flexion 25  Center and to the L - tightness  Left lateral flexion 20 Center and to the R - tightness    Goal status: INITIAL  3.  Pt will improve her score on the NDI to </= 27/50 for decreased pain and improved function Baseline: 32/50 (11/19) Goal status: INITIAL  4.  Pt will increase her mid and lower trap strength to 4/5 for improved function and decreased pain Baseline: 3/5 (11/19) Goal status: INITIAL    ASSESSMENT:  CLINICAL IMPRESSION: Emphasis of skilled PT session on*** trialing various other stretches for her cervical and upper shoulder region as well as working on periscapular strengthening exercises. Pt is challenged by periscapular strengthening exercises this visit. Added stretches  and strengthening exercises to her HEP. Pt declines a handout, added to her Medbridge app. She continues to benefit from skilled PT services to work towards improving her strength and ROM in order to increase her management of her pain symptoms and improve her function. Continue POC.    OBJECTIVE IMPAIRMENTS: decreased activity tolerance, decreased knowledge of condition, decreased ROM, decreased strength, increased fascial restrictions, impaired perceived functional ability, impaired UE functional use, improper body mechanics, postural dysfunction, and pain.   ACTIVITY LIMITATIONS: carrying, lifting, and reach over head  PARTICIPATION LIMITATIONS: meal prep, cleaning, laundry, driving, shopping, community activity, occupation, and yard work  PERSONAL FACTORS: Time since onset of injury/illness/exacerbation, Transportation, and 3+ comorbidities:  long-Covid, positive ANA, arthralgia, anemia, B12 deficiency, mixed hyperlipidemia, prediabetesare also affecting patient's functional outcome.   REHAB POTENTIAL: Good  CLINICAL DECISION MAKING: Stable/uncomplicated  EVALUATION COMPLEXITY: Low  PLAN:  PT FREQUENCY: 1x/week  PT DURATION: 8 weeks  PLANNED INTERVENTIONS: 97164- PT Re-evaluation, 97750-  Physical Performance Testing, 97110-Therapeutic exercises, 97530- Therapeutic activity, [id number]- Neuromuscular re-education, 97535- Self Care, 02859- Manual therapy, [id number]- Aquatic Therapy, [id number]- Electrical stimulation (manual), [phone] (1-2 muscles), 20561 (3+ muscles)- Dry Needling, Patient/Family education, Taping, Joint mobilization, Spinal mobilization, Cryotherapy, and Moist heat  PLAN FOR NEXT SESSION: how is HEP? chin tucks, IYT postural stabilization (in sitting, with dumbbells), scap squeezes, lat pull downs, resisted shoulder doorways exercises, UT and levator scap stretches, cupping? TPDN?***   Waddell Southgate, PT Waddell Southgate, PT, DPT, CSRS  11/17/2024, 7:57 AM  For all possible CPT codes, reference the Planned Interventions line above.     Check all conditions that are expected to impact treatment: {Conditions expected to impact treatment:Medical complications related to COVID-19 and Presence of Medical Equipment   If treatment provided at initial evaluation, no treatment charged due to lack of authorization.

## 2024-11-18 DIAGNOSIS — F4312 Post-traumatic stress disorder, chronic: Secondary | ICD-10-CM | POA: Diagnosis not present

## 2024-11-21 NOTE — Telephone Encounter (Signed)
 Attempted to reach patient. Unable to leave voicemail as mailbox is full. Wanted to let her know that [facility] will be contacting her to review her coverage for Lupron and discuss shipment to the office.

## 2024-11-22 ENCOUNTER — Other Ambulatory Visit

## 2024-11-22 ENCOUNTER — Inpatient Hospital Stay

## 2024-11-22 DIAGNOSIS — N920 Excessive and frequent menstruation with regular cycle: Secondary | ICD-10-CM | POA: Insufficient documentation

## 2024-11-22 DIAGNOSIS — D500 Iron deficiency anemia secondary to blood loss (chronic): Secondary | ICD-10-CM | POA: Insufficient documentation

## 2024-11-22 DIAGNOSIS — D259 Leiomyoma of uterus, unspecified: Secondary | ICD-10-CM | POA: Diagnosis not present

## 2024-11-22 LAB — CBC WITH DIFFERENTIAL (CANCER CENTER ONLY)
Abs Immature Granulocytes: 0.03 K/uL (ref 0.00–0.07)
Basophils Absolute: 0 K/uL (ref 0.0–0.1)
Basophils Relative: 1 %
Eosinophils Absolute: 0.3 K/uL (ref 0.0–0.5)
Eosinophils Relative: 4 %
HCT: 34.3 % — ABNORMAL LOW (ref 36.0–46.0)
Hemoglobin: 11.4 g/dL — ABNORMAL LOW (ref 12.0–15.0)
Immature Granulocytes: 0 %
Lymphocytes Relative: 22 %
Lymphs Abs: 1.8 K/uL (ref 0.7–4.0)
MCH: 28.8 pg (ref 26.0–34.0)
MCHC: 33.2 g/dL (ref 30.0–36.0)
MCV: 86.6 fL (ref 80.0–100.0)
Monocytes Absolute: 0.4 K/uL (ref 0.1–1.0)
Monocytes Relative: 5 %
Neutro Abs: 5.8 K/uL (ref 1.7–7.7)
Neutrophils Relative %: 68 %
Platelet Count: 329 K/uL (ref 150–400)
RBC: 3.96 MIL/uL (ref 3.87–5.11)
RDW: 12.7 % (ref 11.5–15.5)
WBC Count: 8.5 K/uL (ref 4.0–10.5)
nRBC: 0 % (ref 0.0–0.2)

## 2024-11-22 LAB — IRON AND IRON BINDING CAPACITY (CC-WL,HP ONLY)
Iron: 173 ug/dL — ABNORMAL HIGH (ref 28–170)
Saturation Ratios: 56 % — ABNORMAL HIGH (ref 10.4–31.8)
TIBC: 309 ug/dL (ref 250–450)
UIBC: 136 ug/dL

## 2024-11-22 LAB — FERRITIN: Ferritin: 16 ng/mL (ref 11–307)

## 2024-11-22 NOTE — Progress Notes (Unsigned)
 [facility] OFFICE PROGRESS NOTE  Patient Care Team: Oley Bascom RAMAN, NP as PCP - General (Pulmonary Disease)  43 y.o.female with history of heavy menstrual cycles, diabetes here for follow up for iron  deficiency anemia.   Relevant history: Heavy menstrual bleeding, fibroids Last colonoscopy: Not yet Last EGD: None yet   Patient received 1 dose of Monoferric  on 11/30/2023.  Hemoglobin improved slightly. Continued with oral iron . Hgb improved. Still iron  deficiency. She had feraheme on 6/4 and 6/13. Ferritin is 26 in Aug and hemoglobin improved.  Clinically feeling better.  Planning on hysterectomy in the near future.  Repeat labs showed Assessment & Plan   No orders of the defined types were placed in this encounter.    Kelli JAYSON Chihuahua, MD  INTERVAL HISTORY: Patient returns for follow-up.  Oncology History   No history exists.     PHYSICAL EXAMINATION: ECOG PERFORMANCE STATUS: {CHL ONC ECOG [phone]}  There were no vitals filed for this visit. There were no vitals filed for this visit.  GENERAL: alert, no distress and comfortable SKIN: skin color normal and no jaundice or bruising or petechiae on exposed skin EYES: normal, sclera clear OROPHARYNX: no exudate  NECK: No palpable mass LYMPH:  no palpable cervical, axillary lymphadenopathy  LUNGS: clear to auscultation and no wheeze or rales with normal breathing effort HEART: regular rate & rhythm  ABDOMEN: abdomen soft, non-tender and nondistended. Musculoskeletal: no edema NEURO: no focal motor/sensory deficits  Relevant data reviewed during this visit included labs.  New labs ordered.

## 2024-11-22 NOTE — Assessment & Plan Note (Signed)
 Repeat labs in about 3 months Resume oral iron  once daily Follow-up 1 to 2 days after lab draw Follow-up earlier if need to be seen and repeat lab earlier due to heavy bleeding

## 2024-11-22 NOTE — Assessment & Plan Note (Signed)
 Cause of IDA

## 2024-11-23 ENCOUNTER — Ambulatory Visit: Payer: Self-pay

## 2024-11-24 ENCOUNTER — Ambulatory Visit

## 2024-11-24 ENCOUNTER — Other Ambulatory Visit (HOSPITAL_COMMUNITY): Payer: Self-pay

## 2024-11-24 VITALS — BP 112/78 | HR 78 | Temp 98.2°F | Resp 17 | Ht 63.0 in | Wt 228.2 lb

## 2024-11-24 DIAGNOSIS — N920 Excessive and frequent menstruation with regular cycle: Secondary | ICD-10-CM | POA: Diagnosis not present

## 2024-11-24 DIAGNOSIS — D500 Iron deficiency anemia secondary to blood loss (chronic): Secondary | ICD-10-CM | POA: Diagnosis not present

## 2024-11-25 ENCOUNTER — Telehealth: Payer: Self-pay

## 2024-11-25 ENCOUNTER — Ambulatory Visit: Payer: Self-pay | Admitting: Physical Therapy

## 2024-11-25 DIAGNOSIS — R2689 Other abnormalities of gait and mobility: Secondary | ICD-10-CM

## 2024-11-25 DIAGNOSIS — R5381 Other malaise: Secondary | ICD-10-CM | POA: Diagnosis present

## 2024-11-25 DIAGNOSIS — M6281 Muscle weakness (generalized): Secondary | ICD-10-CM

## 2024-11-25 DIAGNOSIS — M542 Cervicalgia: Secondary | ICD-10-CM

## 2024-11-25 NOTE — Telephone Encounter (Signed)
 Attempted to reach patient. Unable to leave voicemail as mailbox is full. Lupron has been delivered to our office and we need to schedule a nurse visit for injection.

## 2024-11-25 NOTE — Telephone Encounter (Signed)
 Scheduled patient for next appointment. Called but could not leave a voicemail.

## 2024-11-25 NOTE — Telephone Encounter (Signed)
 Dr. Tina, patient will be scheduled as soon as possible.  Auth Submission: NO AUTH NEEDED Site of care: Site of care: [facility] Payer: [place] healthy blue medicaid Medication & CPT/J Code(s) submitted: Feraheme (ferumoxytol ) [id number] Diagnosis Code:  Route of submission (phone, fax, portal):  Phone # Fax # Auth type: Buy/Bill PB Units/visits requested: 510mg  x 2 doses Reference number:  Approval from: 11/25/24 to 12/14/24

## 2024-11-25 NOTE — Therapy (Signed)
 OUTPATIENT PHYSICAL THERAPY NEURO TREATMENT   Patient Name: Kelli Cooper MRN: [id number] DOB:04-03-1981, 43 y.o., female Today's Date: 11/25/2024   PCP: Oley Bascom RAMAN, NP REFERRING PROVIDER: Oley Bascom RAMAN, NP  END OF SESSION:  PT End of Session - 11/25/24 1358     Visit Number 3    Number of Visits 8   with eval   Date for Recertification  12/14/24   recert   Authorization Type Medicaid [employer]    Authorization Time Period 9 visits approved 11/09/24-01/07/25    Authorization - Visit Number 2    Authorization - Number of Visits 9    PT Start Time 1356    PT Stop Time 1445    PT Time Calculation (min) 49 min    Activity Tolerance Patient tolerated treatment well    Behavior During Therapy WFL for tasks assessed/performed            Past Medical History:  Diagnosis Date   Anemia    Arthralgia    COVID    DM (diabetes mellitus) (HCC)    Iron  deficiency anemia    Muscle pain    Palpitations    Positive ANA (antinuclear antibody)    SOB (shortness of breath)    Past Surgical History:  Procedure Laterality Date   arm surgery Left    Patient Active Problem List   Diagnosis Date Noted   Low vitamin D  level 04/05/2024   Fibroids 04/04/2024   Heavy menstrual bleeding 02/01/2024   Iron  deficiency anemia 09/17/2023   Positive ANA (antinuclear antibody) 08/06/2023   Muscle pain 07/30/2023    ONSET DATE: 10/13/2024 (referral date)  REFERRING DIAG: R53.81 (ICD-10-CM) - Physical deconditioning M54.50,G89.29 (ICD-10-CM) - Chronic bilateral low back pain without sciatica  THERAPY DIAG:  Physical deconditioning  Cervicalgia  Muscle weakness (generalized)  Other abnormalities of gait and mobility  Rationale for Evaluation and Treatment: Habilitation  SUBJECTIVE:                                                                                                                                                                                              SUBJECTIVE STATEMENT: Kelli Cooper  Pt got a professional stretch done and got improved sleep, had more energy for a while. Pt had to focus on relaxing while being stretched. The stretching was a temporary fix and she feels things tightnening back up again.  Pt has been taking a mm relaxer once per week, did not need it this week because of the stretching.  Since last visit pt saw her neurosurgeon, they are going to run some tests and potentially  do an injection. She got xrays done and has an MRI scheduled for 12/19, gyn injections scheduled for 12/22. She has a follow-up with neurosurgeon on 1/27.  She has been doing her exercises every other day, prone shoulder strengthening ones are difficult but are a good challenge level.   Pt accompanied by: self  PERTINENT HISTORY: PMH: long-Covid, positive ANA, arthralgia, anemia, B12 deficiency, mixed hyperlipidemia, prediabetes   PAIN:  Are you having pain? Yes: NPRS scale: 4/10 (now), 11/10 at the worst Pain location: neck, upper shoulders, upper arms, upper back Pain description: feels like screws are twisting Aggravating factors: nothing specific - quit doing anything that flared it up but doesn't remember anything specific Relieving factors: muscle relaxers, ibuprofen , stretching, heating pad  PRECAUTIONS: None  RED FLAGS: None   WEIGHT BEARING RESTRICTIONS: No  FALLS: Has patient fallen in last 6 months? No  LIVING ENVIRONMENT: Lives with: lives alone, with their dog [name] Lives in: House/apartment Stairs: Yes: Internal: 12 steps; on right going up Has following equipment at home: Single point cane  PLOF: Independent with gait, Independent with transfers, and Requires assistive device for independence  PATIENT GOALS: pain management long term goal of being able to walk in the woods with a cane  OBJECTIVE:  Note: Objective measures were completed at Evaluation unless otherwise noted.  DIAGNOSTIC FINDINGS: None updated or  relevant to this POC  COGNITION: Overall cognitive status: Within functional limits for tasks assessed   SENSATION: WFL  COORDINATION: WFL  EDEMA:  None  POSTURE: rounded shoulders and forward head  PALPATION: tightness in cervical paraspinals, thoracic paraspinals, rhomboids, and around shoulder blades with TTP in rhomboids and mid-trap region  CERVICAL ROM:   Active ROM A/PROM (deg) eval  Pain  Flexion 55 -  Extension 55 -  Right lateral flexion 25 Center and to the L - tightness  Left lateral flexion 20 Center and to the R - tightness  Right rotation 50 -  Left rotation 50 -   (Blank rows = not tested)   UPPER EXTREMITY ROM:  Active ROM Right eval Left eval  Shoulder flexion WFL, pain below shoulder blades at end range WFL, pain below shoulder blades at end range  Shoulder extension    Shoulder abduction WFL, pain in pecs at end range [facility], pain in pecs at end range  Shoulder adduction    Shoulder extension    Shoulder internal rotation WFL, tension at end range WFL, tension at end range  Shoulder external rotation [facility]  Elbow flexion    Elbow extension    Wrist flexion    Wrist extension    Wrist ulnar deviation    Wrist radial deviation    Wrist pronation    Wrist supination     (Blank rows = not tested)   UPPER EXTREMITY MMT:  MMT Right eval Left eval  Shoulder flexion 5 5  Shoulder extension    Shoulder abduction 5 5  Shoulder adduction    Shoulder extension    Shoulder internal rotation    Shoulder external rotation    Middle trapezius 3 3  Lower trapezius 3 3  Elbow flexion    Elbow extension    Wrist flexion    Wrist extension    Wrist ulnar deviation    Wrist radial deviation    Wrist pronation    Wrist supination    Grip strength     (Blank rows = not tested)    CERVICAL SPECIAL TESTS:  None  assessed at eval  BED MOBILITY:  Mod I  TRANSFERS: Sit to stand: Modified independence  Assistive device utilized: Single point  cane     Stand to sit: Modified independence  Assistive device utilized: Single point cane     Chair to chair: Modified independence  Assistive device utilized: Single point cane        GAIT: Findings: WFL  FUNCTIONAL TESTS:  None assessed at eval  PATIENT SURVEYS:  NDI: 32/50                                                                                                                              TREATMENT DATE:   TherAct SciFit multi-peaks level 6 for 8 minutes using BUE/BLEs for neural priming for reciprocal movement, dynamic cardiovascular warmup and increased amplitude of stepping.  To address muscle pain/tightness in thoracic region: Supine thoracic mob over towel roll with shoulder abduction x 10 reps With shoulder flexion x 10 reps Supine LTR x 10 reps B To address weakness in postural stabilization muscles: Seated resisted lat pull-downs with green TB 2 x 15 reps Standing resisted shoulder ext with green TB 2 x 10 reps B Standing resisted scap retract with green TB 2 x 15 reps B   Verbally added to HEP, see bolded below    PATIENT EDUCATION: Education details: continue HEP and added to HEP Person educated: Patient Education method: Explanation, Demonstration, Tactile cues, and Verbal cues Education comprehension: verbalized understanding, returned demonstration, and needs further education  HOME EXERCISE PROGRAM: Access Code: [facility] URL: [email or web address] Date: 11/03/2024 Prepared by: Waddell Southgate  Exercises - Supine Suboccipital Release with Tennis Balls  - 1 x daily - 7 x weekly - 1 sets - 1 reps - 5-10 min hold - Standing Bilateral Shoulder Internal Rotation AAROM with Dowel  - 1 x daily - 7 x weekly - 3 sets - 10 reps - Seated Upper Trapezius Stretch  - 1 x daily - 7 x weekly - 1 sets - 3-5 reps - 30 sec hold - Gentle Levator Scapulae Stretch  - 1 x daily - 7 x weekly - 1 sets - 3-5 reps - 30 sec hold - Seated Cervical Traction  -  1 x daily - 7 x weekly - 1 sets - 10 reps - 10-15 sec hold - Prone Shoulder Flexion  - 1 x daily - 7 x weekly - 3 sets - 10 reps - Prone Single Arm Shoulder Y  - 1 x daily - 7 x weekly - 3 sets - 10 reps - Prone W Scapular Retraction  - 1 x daily - 7 x weekly - 3 sets - 10 reps - Prone Scapular Slide with Shoulder Extension  - 1 x daily - 7 x weekly - 3 sets - 10 reps - Seated High Lat Pull Down with Overhead Anchored Resistance  - 1 x daily - 7 x weekly - 3 sets - 10 reps - Shoulder extension  with resistance - Neutral  - 1 x daily - 7 x weekly - 3 sets - 10 reps - Scapular Retraction with Resistance  - 1 x daily - 7 x weekly - 3 sets - 10 reps  GOALS: Goals reviewed with patient? Yes  SHORT TERM GOALS: Target date: 11/24/2024   Pt will be independent with initial HEP for improved cervical ROM, improved posture and management of pain symptoms in order to build upon functional gains made in therapy. Baseline: independent with current HEP (12/12) Goal status: MET   LONG TERM GOALS: Target date: 12/14/2024   Pt will be independent with final HEP for improved cervical ROM, improved posture and management of pain symptoms in order to build upon functional gains made in therapy. Baseline:  Goal status: INITIAL  2.  Pt will increase her bilateral lateral cervical flexion by >/= 10 degrees for improved function. Baseline:  CERVICAL ROM:   Active ROM AROM (deg) Eval 11/12/2024  Pain  Right lateral flexion 25 Center and to the L - tightness  Left lateral flexion 20 Center and to the R - tightness    Goal status: INITIAL  3.  Pt will improve her score on the NDI to </= 27/50 for decreased pain and improved function Baseline: 32/50 (11/19) Goal status: INITIAL  4.  Pt will increase her mid and lower trap strength to 4/5 for improved function and decreased pain Baseline: 3/5 (11/19) Goal status: INITIAL    ASSESSMENT:  CLINICAL IMPRESSION: Emphasis of skilled PT session on  assessing STG and then continuing to work on postural stabilization and strengthening. Pt has met 1/1 STG due to being independent with her initial HEP. She remains challenged by periscapular strengthening exercises. She continues to benefit from skilled PT services to work towards improving her strength and ROM in order to increase her management of her pain symptoms and improve her function. Continue POC.    OBJECTIVE IMPAIRMENTS: decreased activity tolerance, decreased knowledge of condition, decreased ROM, decreased strength, increased fascial restrictions, impaired perceived functional ability, impaired UE functional use, improper body mechanics, postural dysfunction, and pain.   ACTIVITY LIMITATIONS: carrying, lifting, and reach over head  PARTICIPATION LIMITATIONS: meal prep, cleaning, laundry, driving, shopping, community activity, occupation, and yard work  PERSONAL FACTORS: Time since onset of injury/illness/exacerbation, Transportation, and 3+ comorbidities:  long-Covid, positive ANA, arthralgia, anemia, B12 deficiency, mixed hyperlipidemia, prediabetesare also affecting patient's functional outcome.   REHAB POTENTIAL: Good  CLINICAL DECISION MAKING: Stable/uncomplicated  EVALUATION COMPLEXITY: Low  PLAN:  PT FREQUENCY: 1x/week  PT DURATION: 8 weeks  PLANNED INTERVENTIONS: 97164- PT Re-evaluation, 97750- Physical Performance Testing, 97110-Therapeutic exercises, 97530- Therapeutic activity, [id number]- Neuromuscular re-education, 97535- Self Care, 02859- Manual therapy, [id number]- Aquatic Therapy, [id number]- Electrical stimulation (manual), [phone] (1-2 muscles), 20561 (3+ muscles)- Dry Needling, Patient/Family education, Taping, Joint mobilization, Spinal mobilization, Cryotherapy, and Moist heat  PLAN FOR NEXT SESSION: how is HEP? chin tucks, IYT postural stabilization (in sitting, with dumbbells), quadruped cat/cow, bird dogs, UT and levator scap stretches, cupping? TPDN?   Waddell Southgate, PT Waddell Southgate, PT, DPT, CSRS  11/25/2024, 2:55 PM  For all possible CPT codes, reference the Planned Interventions line above.     Check all conditions that are expected to impact treatment: {Conditions expected to impact treatment:Medical complications related to COVID-19 and Presence of Medical Equipment   If treatment provided at initial evaluation, no treatment charged due to lack of authorization.

## 2024-11-28 ENCOUNTER — Ambulatory Visit (HOSPITAL_BASED_OUTPATIENT_CLINIC_OR_DEPARTMENT_OTHER)

## 2024-11-28 DIAGNOSIS — F4312 Post-traumatic stress disorder, chronic: Secondary | ICD-10-CM | POA: Diagnosis not present

## 2024-11-30 ENCOUNTER — Ambulatory Visit: Payer: Self-pay | Admitting: Physical Therapy

## 2024-11-30 DIAGNOSIS — R2689 Other abnormalities of gait and mobility: Secondary | ICD-10-CM

## 2024-11-30 DIAGNOSIS — M542 Cervicalgia: Secondary | ICD-10-CM

## 2024-11-30 DIAGNOSIS — R5381 Other malaise: Secondary | ICD-10-CM

## 2024-11-30 DIAGNOSIS — M6281 Muscle weakness (generalized): Secondary | ICD-10-CM

## 2024-11-30 NOTE — Therapy (Signed)
 OUTPATIENT PHYSICAL THERAPY NEURO TREATMENT   Patient Name: Kelli Cooper MRN: [id number] DOB:1981/11/17, 43 y.o., female Today's Date: 11/30/2024   PCP: Oley Bascom RAMAN, NP REFERRING PROVIDER: Oley Bascom RAMAN, NP  END OF SESSION:  PT End of Session - 11/30/24 1405     Visit Number 4    Number of Visits 8   with eval   Date for Recertification  12/14/24   recert   Authorization Type Medicaid [employer]    Authorization Time Period 9 visits approved 11/09/24-01/07/25    Authorization - Number of Visits 9    PT Start Time 1404    PT Stop Time 1445    PT Time Calculation (min) 41 min    Activity Tolerance Patient tolerated treatment well    Behavior During Therapy WFL for tasks assessed/performed             Past Medical History:  Diagnosis Date   Anemia    Arthralgia    COVID    DM (diabetes mellitus) (HCC)    Iron  deficiency anemia    Muscle pain    Palpitations    Positive ANA (antinuclear antibody)    SOB (shortness of breath)    Past Surgical History:  Procedure Laterality Date   arm surgery Left    Patient Active Problem List   Diagnosis Date Noted   Low vitamin D  level 04/05/2024   Fibroids 04/04/2024   Heavy menstrual bleeding 02/01/2024   Iron  deficiency anemia 09/17/2023   Positive ANA (antinuclear antibody) 08/06/2023   Muscle pain 07/30/2023    ONSET DATE: 10/13/2024 (referral date)  REFERRING DIAG: R53.81 (ICD-10-CM) - Physical deconditioning M54.50,G89.29 (ICD-10-CM) - Chronic bilateral low back pain without sciatica  THERAPY DIAG:  Physical deconditioning  Cervicalgia  Muscle weakness (generalized)  Other abnormalities of gait and mobility  Rationale for Evaluation and Treatment: Habilitation  SUBJECTIVE:                                                                                                                                                                                             SUBJECTIVE  STATEMENT: Kelli Cooper  Pt feeling very tired today, spent all day yesterday socializing with an old friend.   Pt accompanied by: self  PERTINENT HISTORY: PMH: long-Covid, positive ANA, arthralgia, anemia, B12 deficiency, mixed hyperlipidemia, prediabetes   PAIN:  Are you having pain? Yes: NPRS scale: 4/10 (now), 11/10 at the worst Pain location: neck, upper shoulders, upper arms, upper back Pain description: feels like screws are twisting Aggravating factors: nothing specific - quit doing anything that flared it up but doesn't remember anything specific  Relieving factors: muscle relaxers, ibuprofen , stretching, heating pad  PRECAUTIONS: None  RED FLAGS: None   WEIGHT BEARING RESTRICTIONS: No  FALLS: Has patient fallen in last 6 months? No  LIVING ENVIRONMENT: Lives with: lives alone, with their dog Kelli Cooper Lives in: House/apartment Stairs: Yes: Internal: 12 steps; on right going up Has following equipment at home: Single point cane  PLOF: Independent with gait, Independent with transfers, and Requires assistive device for independence  PATIENT GOALS: pain management long term goal of being able to walk in the woods with a cane  OBJECTIVE:  Note: Objective measures were completed at Evaluation unless otherwise noted.  DIAGNOSTIC FINDINGS: None updated or relevant to this POC  COGNITION: Overall cognitive status: Within functional limits for tasks assessed   SENSATION: WFL  COORDINATION: WFL  EDEMA:  None  POSTURE: rounded shoulders and forward head  PALPATION: tightness in cervical paraspinals, thoracic paraspinals, rhomboids, and around shoulder blades with TTP in rhomboids and mid-trap region  CERVICAL ROM:   Active ROM A/PROM (deg) eval  Pain  Flexion 55 -  Extension 55 -  Right lateral flexion 25 Center and to the L - tightness  Left lateral flexion 20 Center and to the R - tightness  Right rotation 50 -  Left rotation 50 -   (Blank rows = not  tested)   UPPER EXTREMITY ROM:  Active ROM Right eval Left eval  Shoulder flexion WFL, pain below shoulder blades at end range WFL, pain below shoulder blades at end range  Shoulder extension    Shoulder abduction WFL, pain in pecs at end range [facility], pain in pecs at end range  Shoulder adduction    Shoulder extension    Shoulder internal rotation WFL, tension at end range WFL, tension at end range  Shoulder external rotation [facility]  Elbow flexion    Elbow extension    Wrist flexion    Wrist extension    Wrist ulnar deviation    Wrist radial deviation    Wrist pronation    Wrist supination     (Blank rows = not tested)   UPPER EXTREMITY MMT:  MMT Right eval Left eval  Shoulder flexion 5 5  Shoulder extension    Shoulder abduction 5 5  Shoulder adduction    Shoulder extension    Shoulder internal rotation    Shoulder external rotation    Middle trapezius 3 3  Lower trapezius 3 3  Elbow flexion    Elbow extension    Wrist flexion    Wrist extension    Wrist ulnar deviation    Wrist radial deviation    Wrist pronation    Wrist supination    Grip strength     (Blank rows = not tested)    CERVICAL SPECIAL TESTS:  None assessed at eval  BED MOBILITY:  Mod I  TRANSFERS: Sit to stand: Modified independence  Assistive device utilized: Single point cane     Stand to sit: Modified independence  Assistive device utilized: Single point cane     Chair to chair: Modified independence  Assistive device utilized: Single point cane        GAIT: Findings: WFL  FUNCTIONAL TESTS:  None assessed at eval  PATIENT SURVEYS:  NDI: 32/50  TREATMENT DATE:   TherAct SciFit multi-peaks level 6 for 8 minutes using BUE/BLEs for neural priming for reciprocal movement, dynamic cardiovascular warmup and increased amplitude of stepping.   Reviewed how to  log into MedBridge GO App to access her HEP  To work on core stretching as well as stabilization in quadruped: Cat/cows x 10-15 reps Alt LE kicks x 10 reps B To work on periscapular strengthening and stabilization with 2# dumbbells: Seated shoulder flexion 2 x 10 reps B Seated shoulder scaption 2 x 10 reps B Seated shoulder abduction 2 x 10 reps B    PATIENT EDUCATION: Education details: continue HEP and added to HEP Person educated: Patient Education method: Programmer, Multimedia, Demonstration, Actor cues, and Verbal cues Education comprehension: verbalized understanding, returned demonstration, and needs further education  HOME EXERCISE PROGRAM: Access Code: [facility] URL: [email or web address] Date: 11/03/2024 Prepared by: Waddell Southgate  Exercises - Supine Suboccipital Release with Tennis Balls  - 1 x daily - 7 x weekly - 1 sets - 1 reps - 5-10 min hold - Standing Bilateral Shoulder Internal Rotation AAROM with Dowel  - 1 x daily - 7 x weekly - 3 sets - 10 reps - Seated Upper Trapezius Stretch  - 1 x daily - 7 x weekly - 1 sets - 3-5 reps - 30 sec hold - Gentle Levator Scapulae Stretch  - 1 x daily - 7 x weekly - 1 sets - 3-5 reps - 30 sec hold - Seated Cervical Traction  - 1 x daily - 7 x weekly - 1 sets - 10 reps - 10-15 sec hold - Prone Shoulder Flexion  - 1 x daily - 7 x weekly - 3 sets - 10 reps - Prone Single Arm Shoulder Y  - 1 x daily - 7 x weekly - 3 sets - 10 reps - Prone W Scapular Retraction  - 1 x daily - 7 x weekly - 3 sets - 10 reps - Prone Scapular Slide with Shoulder Extension  - 1 x daily - 7 x weekly - 3 sets - 10 reps - Seated High Lat Pull Down with Overhead Anchored Resistance  - 1 x daily - 7 x weekly - 3 sets - 10 reps - Shoulder extension with resistance - Neutral  - 1 x daily - 7 x weekly - 3 sets - 10 reps - Scapular Retraction with Resistance  - 1 x daily - 7 x weekly - 3 sets - 10 reps - Beginner Front Arm Support  - 1 x daily - 7 x weekly -  3 sets - 10 reps  GOALS: Goals reviewed with patient? Yes  SHORT TERM GOALS: Target date: 11/24/2024   Pt will be independent with initial HEP for improved cervical ROM, improved posture and management of pain symptoms in order to build upon functional gains made in therapy. Baseline: independent with current HEP (12/12) Goal status: MET   LONG TERM GOALS: Target date: 12/14/2024   Pt will be independent with final HEP for improved cervical ROM, improved posture and management of pain symptoms in order to build upon functional gains made in therapy. Baseline:  Goal status: INITIAL  2.  Pt will increase her bilateral lateral cervical flexion by >/= 10 degrees for improved function. Baseline:  CERVICAL ROM:   Active ROM AROM (deg) Eval 11/12/2024  Pain  Right lateral flexion 25 Center and to the L - tightness  Left lateral flexion 20 Center and to the R - tightness    Goal status:  INITIAL  3.  Pt will improve her score on the NDI to </= 27/50 for decreased pain and improved function Baseline: 32/50 (11/19) Goal status: INITIAL  4.  Pt will increase her mid and lower trap strength to 4/5 for improved function and decreased pain Baseline: 3/5 (11/19) Goal status: INITIAL    ASSESSMENT:  CLINICAL IMPRESSION: Emphasis of skilled PT session on work on core stretching, core stabilization, periscapular strengthening, and endurance training. Pt challenged by quadruped exercises and periscapular strengthening exercises. She continues to benefit from skilled PT services to work towards improving her strength and ROM in order to increase her management of her pain symptoms and improve her function. Continue POC.    OBJECTIVE IMPAIRMENTS: decreased activity tolerance, decreased knowledge of condition, decreased ROM, decreased strength, increased fascial restrictions, impaired perceived functional ability, impaired UE functional use, improper body mechanics, postural dysfunction,  and pain.   ACTIVITY LIMITATIONS: carrying, lifting, and reach over head  PARTICIPATION LIMITATIONS: meal prep, cleaning, laundry, driving, shopping, community activity, occupation, and yard work  PERSONAL FACTORS: Time since onset of injury/illness/exacerbation, Transportation, and 3+ comorbidities:  long-Covid, positive ANA, arthralgia, anemia, B12 deficiency, mixed hyperlipidemia, prediabetesare also affecting patient's functional outcome.   REHAB POTENTIAL: Good  CLINICAL DECISION MAKING: Stable/uncomplicated  EVALUATION COMPLEXITY: Low  PLAN:  PT FREQUENCY: 1x/week  PT DURATION: 8 weeks  PLANNED INTERVENTIONS: 97164- PT Re-evaluation, 97750- Physical Performance Testing, 97110-Therapeutic exercises, 97530- Therapeutic activity, [id number]- Neuromuscular re-education, 97535- Self Care, 02859- Manual therapy, [id number]- Aquatic Therapy, [id number]- Electrical stimulation (manual), [id number] (1-2 muscles), 20561 (3+ muscles)- Dry Needling, Patient/Family education, Taping, Joint mobilization, Spinal mobilization, Cryotherapy, and Moist heat  PLAN FOR NEXT SESSION: how is HEP? chin tucks, bird dogs, UT and levator scap stretches, cupping? TPDN?, pt to work on her HEP and then report back if any need to be upgraded/progressed   Waddell Southgate, PT Waddell Southgate, PT, DPT, CSRS  11/30/2024, 2:50 PM  For all possible CPT codes, reference the Planned Interventions line above.     Check all conditions that are expected to impact treatment: {Conditions expected to impact treatment:Medical complications related to COVID-19 and Presence of Medical Equipment   If treatment provided at initial evaluation, no treatment charged due to lack of authorization.

## 2024-12-02 ENCOUNTER — Other Ambulatory Visit

## 2024-12-02 DIAGNOSIS — R29898 Other symptoms and signs involving the musculoskeletal system: Secondary | ICD-10-CM

## 2024-12-02 DIAGNOSIS — M5126 Other intervertebral disc displacement, lumbar region: Secondary | ICD-10-CM | POA: Diagnosis not present

## 2024-12-04 ENCOUNTER — Ambulatory Visit: Payer: Self-pay | Admitting: Physician Assistant

## 2024-12-05 ENCOUNTER — Other Ambulatory Visit (HOSPITAL_BASED_OUTPATIENT_CLINIC_OR_DEPARTMENT_OTHER): Payer: Self-pay

## 2024-12-05 ENCOUNTER — Ambulatory Visit (HOSPITAL_BASED_OUTPATIENT_CLINIC_OR_DEPARTMENT_OTHER)

## 2024-12-05 VITALS — BP 121/81 | HR 73 | Ht 63.0 in | Wt 219.0 lb

## 2024-12-05 DIAGNOSIS — D259 Leiomyoma of uterus, unspecified: Secondary | ICD-10-CM

## 2024-12-05 DIAGNOSIS — D219 Benign neoplasm of connective and other soft tissue, unspecified: Secondary | ICD-10-CM

## 2024-12-05 DIAGNOSIS — D500 Iron deficiency anemia secondary to blood loss (chronic): Secondary | ICD-10-CM

## 2024-12-05 DIAGNOSIS — N920 Excessive and frequent menstruation with regular cycle: Secondary | ICD-10-CM

## 2024-12-05 MED ORDER — LEUPROLIDE ACETATE 3.75 MG IM KIT
3.7500 mg | PACK | Freq: Once | INTRAMUSCULAR | Status: AC
  Administered 2024-12-05: 3.75 mg via INTRAMUSCULAR

## 2024-12-06 ENCOUNTER — Ambulatory Visit: Payer: Self-pay | Admitting: Physical Therapy

## 2024-12-06 NOTE — Progress Notes (Signed)
 NURSE VISIT- INJECTION  SUBJECTIVE:  Kelli Cooper is a 43 y.o. G69P0010 female here for a Depo Lupron  for per provider order. She is a GYN patient.   OBJECTIVE:  BP 121/81 (BP Location: Right Arm, Patient Position: Sitting, Cuff Size: Normal)   Pulse 73   Ht 5' 3 (1.6 m)   Wt 219 lb (99.3 kg)   SpO2 99%   BMI 38.79 kg/m   Appears well, in no apparent distress  Injection administered in: Right upper quad. gluteus  Meds ordered this encounter  Medications   leuprolide  (LUPRON ) injection 3.75 mg    ASSESSMENT: GYN patient Depo Lupron  for per provider order PLAN: Follow-up: as scheduled

## 2024-12-07 ENCOUNTER — Ambulatory Visit: Payer: Self-pay | Admitting: Physical Therapy

## 2024-12-13 ENCOUNTER — Other Ambulatory Visit: Payer: Self-pay

## 2024-12-13 ENCOUNTER — Ambulatory Visit: Payer: Self-pay

## 2024-12-13 ENCOUNTER — Emergency Department (HOSPITAL_BASED_OUTPATIENT_CLINIC_OR_DEPARTMENT_OTHER)

## 2024-12-13 ENCOUNTER — Inpatient Hospital Stay (HOSPITAL_BASED_OUTPATIENT_CLINIC_OR_DEPARTMENT_OTHER)
Admission: EM | Admit: 2024-12-13 | Discharge: 2024-12-17 | DRG: 759 | Discharge status: Against Medical Advice | Source: Ambulatory Visit | Attending: Obstetrics & Gynecology | Admitting: Obstetrics & Gynecology

## 2024-12-13 DIAGNOSIS — R1032 Left lower quadrant pain: Principal | ICD-10-CM

## 2024-12-13 DIAGNOSIS — R102 Pelvic and perineal pain unspecified side: Secondary | ICD-10-CM | POA: Diagnosis present

## 2024-12-13 DIAGNOSIS — F32A Depression, unspecified: Secondary | ICD-10-CM | POA: Diagnosis present

## 2024-12-13 DIAGNOSIS — U099 Post covid-19 condition, unspecified: Secondary | ICD-10-CM | POA: Diagnosis present

## 2024-12-13 DIAGNOSIS — N730 Acute parametritis and pelvic cellulitis: Secondary | ICD-10-CM | POA: Diagnosis present

## 2024-12-13 DIAGNOSIS — A419 Sepsis, unspecified organism: Secondary | ICD-10-CM

## 2024-12-13 DIAGNOSIS — D649 Anemia, unspecified: Secondary | ICD-10-CM | POA: Diagnosis present

## 2024-12-13 DIAGNOSIS — D500 Iron deficiency anemia secondary to blood loss (chronic): Secondary | ICD-10-CM | POA: Diagnosis present

## 2024-12-13 DIAGNOSIS — N7093 Salpingitis and oophoritis, unspecified: Principal | ICD-10-CM | POA: Diagnosis present

## 2024-12-13 DIAGNOSIS — N9489 Other specified conditions associated with female genital organs and menstrual cycle: Secondary | ICD-10-CM | POA: Diagnosis present

## 2024-12-13 DIAGNOSIS — F419 Anxiety disorder, unspecified: Secondary | ICD-10-CM | POA: Diagnosis present

## 2024-12-13 DIAGNOSIS — Z5982 Transportation insecurity: Secondary | ICD-10-CM

## 2024-12-13 DIAGNOSIS — D250 Submucous leiomyoma of uterus: Secondary | ICD-10-CM | POA: Diagnosis present

## 2024-12-13 DIAGNOSIS — N83292 Other ovarian cyst, left side: Secondary | ICD-10-CM | POA: Diagnosis present

## 2024-12-13 DIAGNOSIS — D72829 Elevated white blood cell count, unspecified: Secondary | ICD-10-CM | POA: Diagnosis present

## 2024-12-13 DIAGNOSIS — N938 Other specified abnormal uterine and vaginal bleeding: Secondary | ICD-10-CM | POA: Diagnosis present

## 2024-12-13 DIAGNOSIS — D509 Iron deficiency anemia, unspecified: Secondary | ICD-10-CM | POA: Diagnosis present

## 2024-12-13 DIAGNOSIS — D259 Leiomyoma of uterus, unspecified: Secondary | ICD-10-CM | POA: Diagnosis present

## 2024-12-13 DIAGNOSIS — Z5329 Procedure and treatment not carried out because of patient's decision for other reasons: Secondary | ICD-10-CM | POA: Diagnosis present

## 2024-12-13 DIAGNOSIS — E876 Hypokalemia: Secondary | ICD-10-CM | POA: Diagnosis present

## 2024-12-13 DIAGNOSIS — Z5941 Food insecurity: Secondary | ICD-10-CM

## 2024-12-13 HISTORY — DX: Prediabetes: R73.03

## 2024-12-13 LAB — URINALYSIS, ROUTINE W REFLEX MICROSCOPIC
Glucose, UA: NEGATIVE mg/dL
Ketones, ur: >80 mg/dL — AB
Nitrite: NEGATIVE
Protein, ur: 100 mg/dL — AB
Specific Gravity, Urine: 1.025 (ref 1.005–1.030)
pH: 6.5 (ref 5.0–8.0)

## 2024-12-13 LAB — CBC
HCT: 32.1 % — ABNORMAL LOW (ref 36.0–46.0)
Hemoglobin: 10.8 g/dL — ABNORMAL LOW (ref 12.0–15.0)
MCH: 28.1 pg (ref 26.0–34.0)
MCHC: 33.6 g/dL (ref 30.0–36.0)
MCV: 83.4 fL (ref 80.0–100.0)
Platelets: 417 K/uL — ABNORMAL HIGH (ref 150–400)
RBC: 3.85 MIL/uL — ABNORMAL LOW (ref 3.87–5.11)
RDW: 12.7 % (ref 11.5–15.5)
WBC: 14.5 K/uL — ABNORMAL HIGH (ref 4.0–10.5)
nRBC: 0 % (ref 0.0–0.2)

## 2024-12-13 LAB — COMPREHENSIVE METABOLIC PANEL WITH GFR
ALT: 33 U/L (ref 0–44)
AST: 19 U/L (ref 15–41)
Albumin: 3.8 g/dL (ref 3.5–5.0)
Alkaline Phosphatase: 139 U/L — ABNORMAL HIGH (ref 38–126)
Anion gap: 15 (ref 5–15)
BUN: 9 mg/dL (ref 6–20)
CO2: 23 mmol/L (ref 22–32)
Calcium: 9.3 mg/dL (ref 8.9–10.3)
Chloride: 101 mmol/L (ref 98–111)
Creatinine, Ser: 0.56 mg/dL (ref 0.44–1.00)
GFR, Estimated: >60 mL/min
Glucose, Bld: 108 mg/dL — ABNORMAL HIGH (ref 70–99)
Potassium: 3.5 mmol/L (ref 3.5–5.1)
Sodium: 139 mmol/L (ref 135–145)
Total Bilirubin: 0.4 mg/dL (ref 0.0–1.2)
Total Protein: 7.4 g/dL (ref 6.5–8.1)

## 2024-12-13 LAB — URINALYSIS, MICROSCOPIC (REFLEX)
RBC / HPF: >50 RBC/hpf (ref 0–5)
WBC, UA: >50 WBC/hpf (ref 0–5)

## 2024-12-13 LAB — LIPASE, BLOOD: Lipase: 21 U/L (ref 11–51)

## 2024-12-13 LAB — PREGNANCY, URINE: Preg Test, Ur: NEGATIVE

## 2024-12-13 MED ORDER — ONDANSETRON HCL 4 MG/2ML IJ SOLN
4.0000 mg | Freq: Once | INTRAMUSCULAR | Status: AC
  Administered 2024-12-13: 4 mg via INTRAVENOUS
  Filled 2024-12-13: qty 2

## 2024-12-13 MED ORDER — MORPHINE SULFATE (PF) 4 MG/ML IV SOLN
4.0000 mg | Freq: Once | INTRAVENOUS | Status: AC
  Administered 2024-12-13: 4 mg via INTRAVENOUS
  Filled 2024-12-13: qty 1

## 2024-12-13 MED ORDER — IOHEXOL 300 MG/ML  SOLN
100.0000 mL | Freq: Once | INTRAMUSCULAR | Status: AC | PRN
  Administered 2024-12-13: 100 mL via INTRAVENOUS

## 2024-12-13 NOTE — Telephone Encounter (Signed)
Just FYI. Kelli Cooper

## 2024-12-13 NOTE — ED Provider Notes (Signed)
 " [place] EMERGENCY DEPARTMENT AT [facility] Provider Note   CSN: [id number] Arrival date & time: 12/13/24  1559    Patient presents with: Abdominal Pain   Kelli Cooper is a 43 y.o. female here for evaluation of abdominal pain.  Patient states she has known fibroid uterus.  Pain just under the ribs to her lower pelvic region to mid , no upper abdominal pain or chest pain currently however pain located more lower abdomen. Received Lupron  shot on 12/05/24.  Since then developed diffuse pain, worse on the left.  Having some vaginal bleeding as well.  Taking muscle relaxers and OTC meds without relief.  Passing flatus.  {Add pertinent medical, surgical, social history, OB history to HPI:32947} HPI     Prior to Admission medications  Medication Sig Start Date End Date Taking? Authorizing Provider  Blood Pressure Monitoring (OMRON 3 SERIES BP MONITOR) DEVI Use daily as directed 08/11/24   Oley Bascom RAMAN, NP  cyanocobalamin  (VITAMIN B12) 1000 MCG tablet Take 1 tablet (1,000 mcg total) by mouth daily. 11/06/23   Oley Bascom RAMAN, NP  cyclobenzaprine  (FLEXERIL ) 10 MG tablet Take 1 tablet (10 mg total) by mouth 3 (three) times daily as needed for muscle spasms. 10/13/24   Oley Bascom RAMAN, NP  ibuprofen  (ADVIL ) 200 MG tablet Take 200 mg by mouth every 6 (six) hours as needed.    [provider]  Iron , Ferrous Sulfate , 325 (65 Fe) MG TABS Take 325 mg by mouth daily. 11/06/23   Oley Bascom RAMAN, NP  norethindrone  (MICRONOR ) 0.35 MG tablet Take 1 tablet (0.35 mg total) by mouth daily. 04/04/24   Lo, Arland POUR, CNM  Vitamin D , Ergocalciferol , (DRISDOL ) 1.25 MG (50000 UNIT) CAPS capsule Take 1 capsule (50,000 Units total) by mouth every 7 (seven) days. 06/29/24   Cleotilde Ronal RAMAN, MD    Allergies: Patient has no known allergies.    Review of Systems  Constitutional: Negative.   HENT: Negative.    Respiratory: Negative.    Cardiovascular: Negative.   Gastrointestinal:   Positive for abdominal pain and nausea. Negative for abdominal distention and vomiting.  Genitourinary: Negative.   Musculoskeletal: Negative.   Skin: Negative.   Neurological: Negative.   All other systems reviewed and are negative.   Updated Vital Signs BP 117/84   Pulse (!) 121   Temp 98.5 F (36.9 C) (Oral)   Resp 18   SpO2 97%   Physical Exam Vitals and nursing note reviewed.  Constitutional:      General: She is not in acute distress.    Appearance: She is well-developed. She is not ill-appearing, toxic-appearing or diaphoretic.  HENT:     Head: Normocephalic and atraumatic.  Eyes:     Pupils: Pupils are equal, round, and reactive to light.  Cardiovascular:     Rate and Rhythm: Tachycardia present.  Pulmonary:     Effort: Pulmonary effort is normal. No respiratory distress.     Breath sounds: Normal breath sounds.  Abdominal:     General: Bowel sounds are normal. There is no distension.     Palpations: Abdomen is soft.     Tenderness: There is generalized abdominal tenderness and tenderness in the suprapubic area, left upper quadrant and left lower quadrant.  Musculoskeletal:        General: Normal range of motion.     Cervical back: Normal range of motion.  Skin:    General: Skin is warm and dry.  Neurological:  General: No focal deficit present.     Mental Status: She is alert.  Psychiatric:        Mood and Affect: Mood normal.     (all labs ordered are listed, but only abnormal results are displayed) Labs Reviewed  COMPREHENSIVE METABOLIC PANEL WITH GFR - Abnormal; Notable for the following components:      Result Value   Glucose, Bld 108 (*)    Alkaline Phosphatase 139 (*)    All other components within normal limits  CBC - Abnormal; Notable for the following components:   WBC 14.5 (*)    RBC 3.85 (*)    Hemoglobin 10.8 (*)    HCT 32.1 (*)    Platelets 417 (*)    All other components within normal limits  URINALYSIS, ROUTINE W REFLEX  MICROSCOPIC - Abnormal; Notable for the following components:   Hgb urine dipstick LARGE (*)    Bilirubin Urine MODERATE (*)    Ketones, ur >80 (*)    Protein, ur 100 (*)    Leukocytes,Ua TRACE (*)    All other components within normal limits  URINALYSIS, MICROSCOPIC (REFLEX) - Abnormal; Notable for the following components:   Bacteria, UA RARE (*)    All other components within normal limits  LIPASE, BLOOD  PREGNANCY, URINE    EKG: None  Radiology: No results found.  {Document cardiac monitor, telemetry assessment procedure when appropriate:32947} Procedures   Medications Ordered in the ED - No data to display  43 year old here for evaluation of abdominal pain.  Received Lupron  injection by OB/GYN about a week ago.  Since then has had diffuse abdominal cramping.  Initially located upper abdomen however now located to lower abdomen left greater than right.  When pain was at its worst she was short of breath.  No chest pain.  He is having some vaginal bleeding as well.  Gets iron  transfusions.  Tender lower abdomen here.  Mildly tachycardic.  Will plan on labs, imaging, reassess  Labs and imaging personally viewed interpreted CBC leukocytosis 14.5, hemoglobin 10.8 Metabolic panel without significant abnormality Lipase 21 UA large blood, no infection Pregnancy test negative  Patient reassessed. ***   {Click here for ABCD2, HEART and other calculators REFRESH Note before signing:1}                              Medical Decision Making Amount and/or Complexity of Data Reviewed Labs: ordered. Radiology: ordered.  Risk Prescription drug management.     {Document critical care time when appropriate  Document review of labs and clinical decision tools ie CHADS2VASC2, etc  Document your independent review of radiology images and any outside records  Document your discussion with family members, caretakers and with consultants  Document social determinants of health  affecting pt's care  Document your decision making why or why not admission, treatments were needed:32947:::1}   Final diagnoses:  None    ED Discharge Orders     None        "

## 2024-12-13 NOTE — ED Triage Notes (Signed)
 Patient states received depo shot on 12/05/24. Reports abdominal pain since. States today it is mostly on the left lower side. Reports shortness of breath from the abdominal pain.

## 2024-12-13 NOTE — Telephone Encounter (Signed)
 FYI Only or Action Required?: FYI only for provider: ED advised.  Patient was last seen in primary care on 10/13/2024 by Oley Bascom RAMAN, NP.  Called Nurse Triage reporting Abdominal Pain.  Symptoms began a week ago.  Interventions attempted: Rest, hydration, or home remedies.  Symptoms are: severe and changing.  Triage Disposition: Go to ED Now (Notify PCP)  Patient/caregiver understands and will follow disposition?: Yes    Copied from CRM #[id number]. Topic: Clinical - Red Word Triage >> Dec 13, 2024  2:03 PM [name] wrote: Red Word that prompted transfer to Nurse Triage: bad reaction to depo lupron  given to her by OBGYN- unbearable pain, unable to eat, unable to sleep. Informed to reach out to PCP Reason for Disposition  [1] SEVERE pain (e.g., excruciating) AND [2] present > 1 hour  Answer Assessment - Initial Assessment Questions This RN recommended that pt be examined in ED for symptoms. Pt verbalized understanding. Advised follow up with PCP and OB/GYN after.   Got injection 12/22, afternoon 12/23 had to go to bed early, just intense pain, 12/24 and 12/25 laying in bed screaming because hurt so bad Can't eat, sleep, move Can't adjust in bed Need exercise every day because of other health issues I have and can't do that Today in my abdomen where uterus is near belly button When worst, also muscles just below rib cage, not beneath the rib cage, if standing would be down from the rib cage Sharp pain, today in one spot but it used to be in 2 different spots but now only in 1, so I guess improving there No vaginal bleeding Bloating, painful to wear leggings Having chills Fevers because long covid Waking up with wet tshirts like fever broke in the night almost every morning since injection No heart racing If don't get enough exercise, get heart palpitations, not happened yet No change in awareness, not feeling out of it, no dizziness, not weak Muscle at bottom of rib cage  when trying to take deep breath hurt a lot, God forbid if try to do involuntary yawn, better now that not hurting anymore Just below button and to the left Fibroid is big, can feel it today They did say first day would be worst with med  Protocols used: Abdominal Pain - [facility]

## 2024-12-14 ENCOUNTER — Encounter (HOSPITAL_COMMUNITY): Payer: Self-pay | Admitting: Obstetrics and Gynecology

## 2024-12-14 ENCOUNTER — Telehealth: Payer: Self-pay | Admitting: Obstetrics and Gynecology

## 2024-12-14 ENCOUNTER — Ambulatory Visit: Payer: Self-pay | Admitting: Physical Therapy

## 2024-12-14 ENCOUNTER — Emergency Department (HOSPITAL_BASED_OUTPATIENT_CLINIC_OR_DEPARTMENT_OTHER)

## 2024-12-14 DIAGNOSIS — R1084 Generalized abdominal pain: Secondary | ICD-10-CM | POA: Diagnosis not present

## 2024-12-14 DIAGNOSIS — N938 Other specified abnormal uterine and vaginal bleeding: Secondary | ICD-10-CM | POA: Diagnosis present

## 2024-12-14 DIAGNOSIS — N83292 Other ovarian cyst, left side: Secondary | ICD-10-CM | POA: Diagnosis present

## 2024-12-14 DIAGNOSIS — F419 Anxiety disorder, unspecified: Secondary | ICD-10-CM | POA: Diagnosis present

## 2024-12-14 DIAGNOSIS — N7093 Salpingitis and oophoritis, unspecified: Secondary | ICD-10-CM | POA: Diagnosis present

## 2024-12-14 DIAGNOSIS — Z5329 Procedure and treatment not carried out because of patient's decision for other reasons: Secondary | ICD-10-CM | POA: Diagnosis present

## 2024-12-14 DIAGNOSIS — D259 Leiomyoma of uterus, unspecified: Secondary | ICD-10-CM | POA: Diagnosis present

## 2024-12-14 DIAGNOSIS — Z5941 Food insecurity: Secondary | ICD-10-CM | POA: Diagnosis not present

## 2024-12-14 DIAGNOSIS — D649 Anemia, unspecified: Secondary | ICD-10-CM | POA: Diagnosis present

## 2024-12-14 DIAGNOSIS — D72829 Elevated white blood cell count, unspecified: Secondary | ICD-10-CM | POA: Diagnosis present

## 2024-12-14 DIAGNOSIS — U099 Post covid-19 condition, unspecified: Secondary | ICD-10-CM | POA: Diagnosis present

## 2024-12-14 DIAGNOSIS — Z743 Need for continuous supervision: Secondary | ICD-10-CM | POA: Diagnosis not present

## 2024-12-14 DIAGNOSIS — D500 Iron deficiency anemia secondary to blood loss (chronic): Secondary | ICD-10-CM | POA: Diagnosis not present

## 2024-12-14 DIAGNOSIS — R102 Pelvic and perineal pain unspecified side: Secondary | ICD-10-CM | POA: Diagnosis present

## 2024-12-14 DIAGNOSIS — N9489 Other specified conditions associated with female genital organs and menstrual cycle: Secondary | ICD-10-CM | POA: Diagnosis not present

## 2024-12-14 DIAGNOSIS — N730 Acute parametritis and pelvic cellulitis: Secondary | ICD-10-CM | POA: Diagnosis present

## 2024-12-14 DIAGNOSIS — R1032 Left lower quadrant pain: Secondary | ICD-10-CM | POA: Diagnosis present

## 2024-12-14 DIAGNOSIS — D509 Iron deficiency anemia, unspecified: Secondary | ICD-10-CM | POA: Diagnosis present

## 2024-12-14 DIAGNOSIS — R58 Hemorrhage, not elsewhere classified: Secondary | ICD-10-CM | POA: Diagnosis not present

## 2024-12-14 DIAGNOSIS — A419 Sepsis, unspecified organism: Secondary | ICD-10-CM | POA: Diagnosis present

## 2024-12-14 DIAGNOSIS — E876 Hypokalemia: Secondary | ICD-10-CM | POA: Diagnosis present

## 2024-12-14 DIAGNOSIS — F32A Depression, unspecified: Secondary | ICD-10-CM | POA: Diagnosis present

## 2024-12-14 DIAGNOSIS — Z5982 Transportation insecurity: Secondary | ICD-10-CM | POA: Diagnosis not present

## 2024-12-14 LAB — COMPREHENSIVE METABOLIC PANEL WITH GFR
ALT: 22 U/L (ref 0–44)
AST: 17 U/L (ref 15–41)
Albumin: 3 g/dL — ABNORMAL LOW (ref 3.5–5.0)
Alkaline Phosphatase: 114 U/L (ref 38–126)
Anion gap: 12 (ref 5–15)
BUN: 6 mg/dL (ref 6–20)
CO2: 23 mmol/L (ref 22–32)
Calcium: 8.5 mg/dL — ABNORMAL LOW (ref 8.9–10.3)
Chloride: 106 mmol/L (ref 98–111)
Creatinine, Ser: 0.52 mg/dL (ref 0.44–1.00)
GFR, Estimated: >60 mL/min
Glucose, Bld: 101 mg/dL — ABNORMAL HIGH (ref 70–99)
Potassium: 3.4 mmol/L — ABNORMAL LOW (ref 3.5–5.1)
Sodium: 140 mmol/L (ref 135–145)
Total Bilirubin: 0.5 mg/dL (ref 0.0–1.2)
Total Protein: 6.2 g/dL — ABNORMAL LOW (ref 6.5–8.1)

## 2024-12-14 LAB — WET PREP, GENITAL
Sperm: NONE SEEN
Trich, Wet Prep: NONE SEEN
WBC, Wet Prep HPF POC: >=10 — AB
Yeast Wet Prep HPF POC: NONE SEEN

## 2024-12-14 LAB — SYPHILIS: RPR W/REFLEX TO RPR TITER AND TREPONEMAL ANTIBODIES, TRADITIONAL SCREENING AND DIAGNOSIS ALGORITHM: RPR Ser Ql: NONREACTIVE

## 2024-12-14 LAB — CBC
HCT: 29.3 % — ABNORMAL LOW (ref 36.0–46.0)
Hemoglobin: 9.7 g/dL — ABNORMAL LOW (ref 12.0–15.0)
MCH: 28 pg (ref 26.0–34.0)
MCHC: 33.1 g/dL (ref 30.0–36.0)
MCV: 84.7 fL (ref 80.0–100.0)
Platelets: 381 K/uL (ref 150–400)
RBC: 3.46 MIL/uL — ABNORMAL LOW (ref 3.87–5.11)
RDW: 12.7 % (ref 11.5–15.5)
WBC: 11.9 K/uL — ABNORMAL HIGH (ref 4.0–10.5)
nRBC: 0 % (ref 0.0–0.2)

## 2024-12-14 LAB — LACTIC ACID, PLASMA: Lactic Acid, Venous: 1.1 mmol/L (ref 0.5–1.9)

## 2024-12-14 LAB — HIV ANTIBODY (ROUTINE TESTING W REFLEX): HIV Screen 4th Generation wRfx: NONREACTIVE

## 2024-12-14 MED ORDER — INFLUENZA VIRUS VACC SPLIT PF (FLUZONE) 0.5 ML IM SUSY: 0.5000 mL | PREFILLED_SYRINGE | INTRAMUSCULAR | Status: DC

## 2024-12-14 MED ORDER — ENOXAPARIN SODIUM 40 MG/0.4ML IJ SOSY: 40.0000 mg | PREFILLED_SYRINGE | INTRAMUSCULAR | Status: DC

## 2024-12-14 MED ORDER — ONDANSETRON HCL 4 MG/2ML IJ SOLN
4.0000 mg | Freq: Four times a day (QID) | INTRAMUSCULAR | Status: DC | PRN
  Administered 2024-12-14 – 2024-12-15 (×2): 4 mg via INTRAVENOUS
  Filled 2024-12-14 (×2): qty 2

## 2024-12-14 MED ORDER — METRONIDAZOLE 500 MG PO TABS
500.0000 mg | ORAL_TABLET | Freq: Two times a day (BID) | ORAL | Status: DC
  Administered 2024-12-14 – 2024-12-17 (×6): 500 mg via ORAL
  Filled 2024-12-14 (×6): qty 1

## 2024-12-14 MED ORDER — LACTATED RINGERS IV SOLN: INTRAVENOUS | Status: AC

## 2024-12-14 MED ORDER — HYDROXYZINE HCL 10 MG PO TABS
10.0000 mg | ORAL_TABLET | Freq: Three times a day (TID) | ORAL | Status: DC | PRN
  Administered 2024-12-14 – 2024-12-16 (×6): 10 mg via ORAL
  Filled 2024-12-14 (×6): qty 1

## 2024-12-14 MED ORDER — FERROUS SULFATE 325 (65 FE) MG PO TABS
325.0000 mg | ORAL_TABLET | Freq: Every day | ORAL | Status: DC
  Administered 2024-12-15 – 2024-12-16 (×2): 325 mg via ORAL
  Filled 2024-12-14 (×2): qty 1

## 2024-12-14 MED ORDER — ACETAMINOPHEN 325 MG PO TABS
650.0000 mg | ORAL_TABLET | ORAL | Status: DC | PRN
  Administered 2024-12-15 – 2024-12-16 (×2): 650 mg via ORAL
  Filled 2024-12-14 (×3): qty 2

## 2024-12-14 MED ORDER — METRONIDAZOLE 500 MG/100ML IV SOLN
500.0000 mg | Freq: Once | INTRAVENOUS | Status: AC
  Administered 2024-12-14: 500 mg via INTRAVENOUS
  Filled 2024-12-14: qty 100

## 2024-12-14 MED ORDER — SODIUM CHLORIDE 0.9 % IV SOLN
100.0000 mg | Freq: Once | INTRAVENOUS | Status: AC
  Administered 2024-12-14: 100 mg via INTRAVENOUS
  Filled 2024-12-14: qty 100

## 2024-12-14 MED ORDER — LACTATED RINGERS IV BOLUS
1000.0000 mL | Freq: Once | INTRAVENOUS | Status: AC
  Administered 2024-12-14: 1000 mL via INTRAVENOUS

## 2024-12-14 MED ORDER — SODIUM CHLORIDE 0.9 % IV SOLN
2.0000 g | INTRAVENOUS | Status: DC
  Administered 2024-12-15 – 2024-12-17 (×3): 2 g via INTRAVENOUS
  Filled 2024-12-14 (×3): qty 20

## 2024-12-14 MED ORDER — LIDOCAINE HCL (PF) 1 % IJ SOLN
INTRAMUSCULAR | Status: AC
  Filled 2024-12-14: qty 5

## 2024-12-14 MED ORDER — CEFTRIAXONE SODIUM 500 MG IJ SOLR
500.0000 mg | Freq: Once | INTRAMUSCULAR | Status: AC
  Administered 2024-12-14: 500 mg via INTRAMUSCULAR
  Filled 2024-12-14: qty 500

## 2024-12-14 MED ORDER — CYCLOBENZAPRINE HCL 10 MG PO TABS
10.0000 mg | ORAL_TABLET | Freq: Three times a day (TID) | ORAL | Status: DC | PRN
  Administered 2024-12-15: 10 mg via ORAL
  Filled 2024-12-14: qty 1

## 2024-12-14 MED ORDER — IBUPROFEN 200 MG PO TABS: 200.0000 mg | ORAL_TABLET | Freq: Four times a day (QID) | ORAL | Status: DC | PRN

## 2024-12-14 MED ORDER — ACETAMINOPHEN 500 MG PO TABS
ORAL_TABLET | ORAL | Status: AC
  Filled 2024-12-14: qty 2

## 2024-12-14 MED ORDER — PRENATAL MULTIVITAMIN CH
1.0000 | ORAL_TABLET | Freq: Every day | ORAL | Status: DC
  Administered 2024-12-16: 1 via ORAL
  Filled 2024-12-14 (×2): qty 1

## 2024-12-14 MED ORDER — HYDROMORPHONE HCL 1 MG/ML IJ SOLN: 0.2000 mg | INTRAMUSCULAR | Status: DC | PRN

## 2024-12-14 MED ORDER — BUTALBITAL-APAP-CAFFEINE 50-325-40 MG PO TABS
1.0000 | ORAL_TABLET | Freq: Four times a day (QID) | ORAL | Status: DC | PRN
  Administered 2024-12-14 – 2024-12-15 (×2): 1 via ORAL
  Filled 2024-12-14 (×2): qty 1

## 2024-12-14 MED ORDER — OXYCODONE HCL 5 MG PO TABS
5.0000 mg | ORAL_TABLET | ORAL | Status: DC | PRN
  Administered 2024-12-14 – 2024-12-15 (×3): 5 mg via ORAL
  Administered 2024-12-15: 10 mg via ORAL
  Administered 2024-12-15 – 2024-12-17 (×6): 5 mg via ORAL
  Filled 2024-12-14 (×3): qty 2
  Filled 2024-12-14 (×7): qty 1

## 2024-12-14 MED ORDER — KETOROLAC TROMETHAMINE 30 MG/ML IJ SOLN: 30.0000 mg | Freq: Four times a day (QID) | INTRAMUSCULAR | Status: DC

## 2024-12-14 MED ORDER — ONDANSETRON HCL 4 MG PO TABS: 4.0000 mg | ORAL_TABLET | Freq: Four times a day (QID) | ORAL | Status: DC | PRN

## 2024-12-14 MED ORDER — KETOROLAC TROMETHAMINE 30 MG/ML IJ SOLN
30.0000 mg | Freq: Four times a day (QID) | INTRAMUSCULAR | Status: DC
  Administered 2024-12-14 – 2024-12-17 (×11): 30 mg via INTRAVENOUS
  Filled 2024-12-14 (×12): qty 1

## 2024-12-14 MED ORDER — ACETAMINOPHEN 500 MG PO TABS
1000.0000 mg | ORAL_TABLET | Freq: Once | ORAL | Status: AC
  Administered 2024-12-14: 1000 mg via ORAL

## 2024-12-14 MED ORDER — SODIUM CHLORIDE 0.9 % IV SOLN
100.0000 mg | Freq: Two times a day (BID) | INTRAVENOUS | Status: DC
  Administered 2024-12-14 – 2024-12-15 (×3): 100 mg via INTRAVENOUS
  Filled 2024-12-14 (×4): qty 100

## 2024-12-14 MED ORDER — KETOROLAC TROMETHAMINE 15 MG/ML IJ SOLN
30.0000 mg | Freq: Once | INTRAMUSCULAR | Status: AC
  Administered 2024-12-14: 30 mg via INTRAVENOUS
  Filled 2024-12-14: qty 2

## 2024-12-14 NOTE — Progress Notes (Signed)
 SUBJECTIVE: pt feeling ok, had been worried due to not being able to find someone to take her dog out but has been able to ID someone to do that. Having some discomfort in LLQ but otherwise ok  OBJECTIVE:  Today's Vitals   12/14/24 0938 12/14/24 1215 12/14/24 1216 12/14/24 1258  BP: 129/77     Pulse: 97     Resp: 16     Temp: 98.3 F (36.8 C)     TempSrc: Oral     SpO2: 100%     Weight: 99.8 kg     Height: 5' 3 (1.6 m)     PainSc: 2  6  4   0-No pain   Body mass index is 38.97 kg/m.  NAD  ASSESSMENT/PLAN:  Labs today to assess trend IV ABX started SCDs  Analgesia prn   Carter Quarry, MD, FACOG Minimally Invasive Gynecologic Surgery  Obstetrics and Gynecology, [facility] for [facility], [facility] 12/14/2024

## 2024-12-14 NOTE — H&P (Addendum)
 GYNECOLOGY HISTORY AND PHYSICAL    Kelli Cooper is an 43 y.o. female. Patient presented to [facility] overnight with pain and fever/chills . Reports having had fevers intermittently due to long Covid. Most symptoms were on 24, the first symptom was exhaustion within the first 24 hours and then excruciating pain not alleviated by home meds. Has not been able to eat due to decreased appetite but has not had nausea or vomiting. Reports having light bleeding now which shouldn't be the case. Pain is mostly in LLQ currently and muscles just below the rib cage were really tender initially, but hat has passed, no where near as bad as it was on the 24th and 25th. Started a period and hasn't ended, going on about 2 weeks now. Pain mostly stayign inplace an. See physical therapy and hasn't been able to do stretches and whoel body feels tight/tense and wants to move but can't. No diaainess, lightheadedness, no SOB or chest pain. Has not had anything else like this previously. Feeling chills. Last intercourse about 6 weeks ago and was on the birth control at that time.    Pertinent Gynecological History: Menses: flow is light Bleeding: dysfunctional uterine bleeding Contraception: lupron , abstinence Previous GYN Procedures: none  Last mammogram: not completed Last pap:     Component Value Date/Time   DIAGPAP  04/04/2024 1101    - Negative for intraepithelial lesion or malignancy (NILM)   HPVHIGH Negative 04/04/2024 1101   ADEQPAP  04/04/2024 1101    Satisfactory for evaluation; transformation zone component ABSENT.    OB History: G1, P0010   Menstrual History:  Patient's last menstrual period was 11/25/2024 (approximate).    Past Medical History:  Diagnosis Date   Anemia    Arthralgia    COVID    DM (diabetes mellitus) (HCC)    Iron  deficiency anemia    Muscle pain    Palpitations    Positive ANA (antinuclear antibody)    SOB (shortness of breath)     Past Surgical History:  Procedure  Laterality Date   arm surgery Left     Family History  Problem Relation Age of Onset   Healthy Mother    Healthy Father    Healthy Sister     Social History:  reports that she has never smoked. She has been exposed to tobacco smoke. She has never used smokeless tobacco. She reports that she does not currently use alcohol. She reports current drug use. Drug: Marijuana.  Allergies: Allergies[1]  Medications Prior to Admission  Medication Sig Dispense Refill Last Dose/Taking   cyclobenzaprine  (FLEXERIL ) 10 MG tablet Take 1 tablet (10 mg total) by mouth 3 (three) times daily as needed for muscle spasms. 30 tablet 0 Past Week   ibuprofen  (ADVIL ) 200 MG tablet Take 200 mg by mouth every 6 (six) hours as needed.   12/13/2024   Iron , Ferrous Sulfate , 325 (65 Fe) MG TABS Take 325 mg by mouth daily. 30 tablet 1 12/13/2024   LUPRON  DEPOT, 40-MONTH, 3.75 MG injection Inject into the muscle.   Past Week   norethindrone  (MICRONOR ) 0.35 MG tablet Take 1 tablet (0.35 mg total) by mouth daily. 30 tablet 12 12/13/2024   Blood Pressure Monitoring (OMRON 3 SERIES BP MONITOR) DEVI Use daily as directed 1 each 0    cyanocobalamin  (VITAMIN B12) 1000 MCG tablet Take 1 tablet (1,000 mcg total) by mouth daily. 30 tablet 2 12/12/2024 at 10:00 PM   Vitamin D , Ergocalciferol , (DRISDOL ) 1.25 MG (50000 UNIT) CAPS capsule  Take 1 capsule (50,000 Units total) by mouth every 7 (seven) days. 12 capsule 0     Review of Systems  Blood pressure 129/77, pulse 97, temperature 98.3 F (36.8 C), temperature source Oral, resp. rate 16, height 5' 3 (1.6 m), weight 99.8 kg, last menstrual period 11/25/2024, SpO2 100%. Physical Exam Vitals reviewed. Exam conducted with a chaperone present.  Constitutional:      Appearance: She is well-developed.  Pulmonary:     Effort: Pulmonary effort is normal.  Abdominal:     Tenderness: There is abdominal tenderness in the suprapubic area and left lower quadrant.     Comments: Enlarged,  tender uterus, left > right  Genitourinary:    General: Normal vulva.     Vagina: Bleeding present.     Uterus: Enlarged and tender.      Adnexa:        Left: Fullness present.      Comments: Dark blood in vaginal vault Not able to fully visualize cervix Nontender bilateral levator ani  Neurological:     General: No focal deficit present.     Mental Status: She is alert.  Psychiatric:        Mood and Affect: Mood normal.        Behavior: Behavior normal.     Results for orders placed or performed during the hospital encounter of 12/13/24 (from the past 24 hours)  Lipase, blood     Status: None   Collection Time: 12/13/24  4:15 PM  Result Value Ref Range   Lipase 21 11 - 51 U/L  Comprehensive metabolic panel     Status: Abnormal   Collection Time: 12/13/24  4:15 PM  Result Value Ref Range   Sodium 139 135 - 145 mmol/L   Potassium 3.5 3.5 - 5.1 mmol/L   Chloride 101 98 - 111 mmol/L   CO2 23 22 - 32 mmol/L   Glucose, Bld 108 (H) 70 - 99 mg/dL   BUN 9 6 - 20 mg/dL   Creatinine, Ser 9.43 0.44 - 1.00 mg/dL   Calcium  9.3 8.9 - 10.3 mg/dL   Total Protein 7.4 6.5 - 8.1 g/dL   Albumin 3.8 3.5 - 5.0 g/dL   AST 19 15 - 41 U/L   ALT 33 0 - 44 U/L   Alkaline Phosphatase 139 (H) 38 - 126 U/L   Total Bilirubin 0.4 0.0 - 1.2 mg/dL   GFR, Estimated >39 >39 mL/min   Anion gap 15 5 - 15  CBC     Status: Abnormal   Collection Time: 12/13/24  4:15 PM  Result Value Ref Range   WBC 14.5 (H) 4.0 - 10.5 K/uL   RBC 3.85 (L) 3.87 - 5.11 MIL/uL   Hemoglobin 10.8 (L) 12.0 - 15.0 g/dL   HCT 67.8 (L) 63.9 - 53.9 %   MCV 83.4 80.0 - 100.0 fL   MCH 28.1 26.0 - 34.0 pg   MCHC 33.6 30.0 - 36.0 g/dL   RDW 87.2 88.4 - 84.4 %   Platelets 417 (H) 150 - 400 K/uL   nRBC 0.0 0.0 - 0.2 %  Urinalysis, Routine w reflex microscopic -Urine, Clean Catch     Status: Abnormal   Collection Time: 12/13/24  7:12 PM  Result Value Ref Range   Color, Urine YELLOW YELLOW   APPearance CLEAR CLEAR   Specific  Gravity, Urine 1.025 1.005 - 1.030   pH 6.5 5.0 - 8.0   Glucose, UA NEGATIVE NEGATIVE mg/dL   Hgb  urine dipstick LARGE (A) NEGATIVE   Bilirubin Urine MODERATE (A) NEGATIVE   Ketones, ur >80 (A) NEGATIVE mg/dL   Protein, ur 899 (A) NEGATIVE mg/dL   Nitrite NEGATIVE NEGATIVE   Leukocytes,Ua TRACE (A) NEGATIVE  Pregnancy, urine     Status: None   Collection Time: 12/13/24  7:12 PM  Result Value Ref Range   Preg Test, Ur NEGATIVE NEGATIVE  Urinalysis, Microscopic (reflex)     Status: Abnormal   Collection Time: 12/13/24  7:12 PM  Result Value Ref Range   RBC / HPF >50 0 - 5 RBC/hpf   WBC, UA >50 0 - 5 WBC/hpf   Bacteria, UA RARE (A) NONE SEEN   Squamous Epithelial / HPF 6-10 0 - 5 /HPF   Mucus PRESENT   Wet prep, genital     Status: Abnormal   Collection Time: 12/14/24  1:55 AM  Result Value Ref Range   Yeast Wet Prep HPF POC NONE SEEN NONE SEEN   Trich, Wet Prep NONE SEEN NONE SEEN   Clue Cells Wet Prep HPF POC PRESENT (A) NONE SEEN   WBC, Wet Prep HPF POC >=10 (A) <10   Sperm NONE SEEN   Lactic acid, plasma     Status: None   Collection Time: 12/14/24  2:28 AM  Result Value Ref Range   Lactic Acid, Venous 1.1 0.5 - 1.9 mmol/L    US  PELVIC TRANSABD W/PELVIC DOPPLER Result Date: 12/14/2024 EXAM: PELVIC ULTRASOUND TECHNIQUE: Transabdominal pelvic duplex ultrasound using B-mode/gray scaled imaging with Doppler spectral analysis and color flow was obtained. COMPARISON: CT abdomen and pelvis 12/13/2024. CLINICAL HISTORY: Pelvic pain. FINDINGS: ULTRASOUND FINDINGS: UTERUS: The uterus anteflexed uterus measures 15.7 x 7.9 x 10.2 cm with a volume of 659 cc. Multiple fibroids are identified. The largest fibroid is exophytic arising off the right side of the uterine fundus measuring 7.3 x 6.0 x 6.3 cm. Within the mid fundal region, there is an intramural fibroid measuring 6.2 x 5.8 x 6.4 cm. Along the left in the anterior fundal region, there is a fibroid measuring 3.9 x 3.1 x 4.7 cm. Within  the right posterior uterine corpus, there is a fibroid measuring 4.3 x 3.7 x 4.6 cm. ENDOMETRIAL STRIPE: The endometrium measures 4 mm in thickness. Fluid is identified within the endometrial canal as well as a small, non-hypervascular echogenic structure measuring 1.5 x 0.2 x 1.5 cm. RIGHT OVARY: The right ovary is not visualized and is obscured by overlying bowel gas. LEFT OVARY: The left ovary measures 11.1 x 8.7 x 9.6 cm with a volume of 483.3 cc. Within the left ovary, there is a cystic structure containing diffuse low-level internal echoes measuring 7.2 x 6.0 x 6.2 cm. Normal color Doppler, arterial and venous waveforms within the left ovary. ADNEXA: Bilateral hydrosalpinx identified. FREE FLUID: No free fluid. IMPRESSION: 1. Left ovarian cystic lesion measuring 7.2 x 6.0 x 6.2 cm with internal low-level echoes, with preserved ovarian Doppler flow. Differential considerations include hemorrhagic cyst, abscess or endometrioma. Follow-up imaging with repeat pelvic sonogram is advised in 3 months to ensure resolution . 2. Bilateral hydrosalpinx. Correlate for any clinical signs or symptoms of pelvic inflammatory disease. 3. Enlarged fibroid uterus, with the dominant fibroid measuring 7.3 x 6.0 x 6.3 cm. 4. Endometrial fluid with a small non-hypervascular echogenic endometrial canal lesion. In the setting of abnormal uterine bleeding, further evaluation with sonohistogram is advised. Electronically signed by: Waddell Calk MD 12/14/2024 06:23 AM EST RP Workstation: [id number]   CT ABDOMEN  PELVIS W CONTRAST Result Date: 12/14/2024 EXAM: CT ABDOMEN AND PELVIS WITH CONTRAST 12/13/2024 10:10:00 PM TECHNIQUE: CT of the abdomen and pelvis was performed with the administration of 100 mL of iohexol  (OMNIPAQUE ) 300 MG/ML solution. Multiplanar reformatted images are provided for review. Automated exposure control, iterative reconstruction, and/or weight-based adjustment of the mA/kV was utilized to reduce the radiation  dose to as low as reasonably achievable. COMPARISON: CT abdomen and pelvis 02/04/2018, cough and pelvic ultrasound 02/04/2018. CLINICAL HISTORY: LLQ abdominal pain. FINDINGS: LOWER CHEST: No acute abnormality. LIVER: The liver is mildly enlarged. GALLBLADDER AND BILE DUCTS: Gallbladder is unremarkable. No biliary ductal dilatation. SPLEEN: No acute abnormality. PANCREAS: No acute abnormality. ADRENAL GLANDS: No acute abnormality. KIDNEYS, URETERS AND BLADDER: No stones in the kidneys or ureters. No hydronephrosis. No perinephric or periureteral stranding. Urinary bladder is unremarkable. GI AND BOWEL: Stomach demonstrates no acute abnormality. The appendix appears normal. There is no bowel obstruction. PERITONEUM AND RETROPERITONEUM: No ascites. No free air. VASCULATURE: Aorta is normal in caliber. LYMPH NODES: No lymphadenopathy. REPRODUCTIVE ORGANS: The uterus is enlarged and lobulated, likely containing multiple fibroids. These fibroids have increased in size, now measuring up to 7.7 cm. There is a partially calcified mass in the right lower quadrant abutting the uterus which may represent an exophytic fibroid measuring 8.7 cm, which has increased in size. Complex cystic mass in the left adnexa with dominant cystic area measuring 6.1 cm has increased in size when compared to the prior study. Small cystic areas are dilated tubules noted in the right adnexa. Separate right adnexa/ovarian mass is not excluded. BONES AND SOFT TISSUES: Bones are mildly enlarged. No focal soft tissue abnormality. IMPRESSION: 1. Enlarged and lobulated uterus with multiple fibroids, the largest measuring 7.7 cm, increased in size compared to prior study. 2. Complex cystic mass in the left adnexa with dominant cystic area measuring 6.1 cm, increased in size compared to prior study. 3. Dilated tubular cystic structures in the right adnexa, which may represent hydrosalpinx. 4. Partially calcified right lower quadrant mass abutting the  uterus, favored to represent an exophytic fibroid measuring 8.7 cm, increased in size compared to prior study, although a separate right adnexal mass is not excluded. 5. Pelvic ultrasound or pelvic MRI may help further delineate. Electronically signed by: Greig Pique MD 12/14/2024 12:16 AM EST RP Workstation: [id number]    Assessment/Plan: Patient admitted for IV antibiotics for presumed PID given fever, leukocytosis and complex left adnexal cyst vs myomatous degeneration.  GYN/ID: CTX/[facility]/Doxy IV, follow up cultures Pain: IV and PO analgesia; fioricet for HA Cards: monitor vitals, stable since arrival  Pulm: saturating well on RA GI: CLD GU: measure in/out PPX: SCDs, LVX tomorrow if remains stable    Kelli Cooper 12/14/2024, 11:37 AM      [1] No Known Allergies

## 2024-12-14 NOTE — Telephone Encounter (Signed)
 Attempted to reach patient to schedule an appointment. Was not able to leave a message because her voicemail is full.

## 2024-12-14 NOTE — ED Notes (Signed)
 Called [facility] at [facility] for transport 07:54-TC

## 2024-12-14 NOTE — Progress Notes (Signed)
 CSW acknowledged consult and attempted to meet with patient; however, RN reported that patient requested to be seen at a later time. CSW will attempt to meet with patient at a later time.   Suzen Law, LCSW Clinical Social Worker [facility] Cell#: [phone]

## 2024-12-14 NOTE — Sepsis Progress Note (Signed)
 Elink monitoring for the code sepsis protocol.

## 2024-12-14 NOTE — Progress Notes (Signed)
 CSW acknowledged consult and met with patient at bedside to address concerns. CSW introduced self and explained reason for consult. Patient was welcoming, pleasant, open, and remained engaged during conversation. CSW inquired about how patient was doing, patient reported that she was a little stressed about her dog's whereabouts and being okay. CSW normalized and validated patient's feelings and how stressful it can be to ensure a pet's safety and wellness while away from home. Patient reported that her friend plans to go by her house this evening to check on her dog. CSW positively affirmed patient's support from a friend. CSW acknowledged consult fo assistance with transportation. Patient confirmed that she utilizes [facility] transportation and noted that it works relatively well aside from emergent needs as you have to schedule days in advance. CSW acknowledged the shortcomings of utilized resource and informed patient that there are limited transportation resources in the community. Patient verbalized understanding and reported that she may be able to get a ride from a friend at discharge. CSW informed patient that a taxi voucher home can be provided if needed, patient verbalized understanding. CSW inquired about any additional needs for resources/supports, patient reported that assistance with clothing would be helpful. CSW agreed to provide resources for clothing via findhelp, patient interested and agreeable. CSW inquired about food insecurity noted on patient's chart, patient denied any current needs and shared that she has a friend that is financially able and willing to assist her with groceries. CSW positively affirmed patient's support system. Patient denied any additional needs/concerns.   CSW provided available clothing resources via findhelp.   CSW signing off no further intervention indicated at this time.   Suzen Law, LCSW Clinical Social Worker [facility] Cell#:  [phone]

## 2024-12-14 NOTE — ED Provider Notes (Signed)
" °  Physical Exam  BP (!) 108/57   Pulse (!) 104   Temp 99.5 F (37.5 C) (Oral)   Resp 18   SpO2 99%   Physical Exam  Procedures  .Critical Care  Performed by: Jerrol Agent, MD Authorized by: Jerrol Agent, MD   Critical care provider statement:    Critical care time (minutes):  30   Critical care was necessary to treat or prevent imminent or life-threatening deterioration of the following conditions:  Sepsis   Critical care was time spent personally by me on the following activities:  Development of treatment plan with patient or surrogate, discussions with consultants, evaluation of patient's response to treatment, examination of patient, ordering and review of laboratory studies, ordering and review of radiographic studies, ordering and performing treatments and interventions, pulse oximetry, re-evaluation of patient's condition and review of old charts   Care discussed with: admitting provider     ED Course / MDM    Medical Decision Making Amount and/or Complexity of Data Reviewed Labs: ordered. Radiology: ordered.  Risk OTC drugs. Prescription drug management. Decision regarding hospitalization.   82F presenting with abdominal pain after her Lupron  shot, waiting on CT imaging. Having vaginal bleeding. Leukocytosis, blood in urine, rare bacteria present.  Labs: Leukocytosis to 14.5, anemia to 10.8, CMP without any acute abnormality, alkaline phosphatase mildly elevated 139, lipase 21, urinalysis with hematuria, rare bacteria.  CT Abd Pel:  IMPRESSION:  1. Enlarged and lobulated uterus with multiple fibroids, the largest measuring  7.7 cm, increased in size compared to prior study.  2. Complex cystic mass in the left adnexa with dominant cystic area measuring  6.1 cm, increased in size compared to prior study.  3. Dilated tubular cystic structures in the right adnexa, which may represent  hydrosalpinx.  4. Partially calcified right lower quadrant mass abutting the  uterus, favored  to represent an exophytic fibroid measuring 8.7 cm, increased in size compared  to prior study, although a separate right adnexal mass is not excluded.  5. Pelvic ultrasound or pelvic MRI may help further delineate.    On reassessment, patient continues to endorse pain, she was last sexually active 6 weeks ago.  She has had persistent bleeding after the Lupron  shot.  Patient meeting SIRS criteria, found to be febrile to 101.2, persistently tachycardic heart rate 107, leukocytosis to 14.5.  With evidence of potential infection in the uterus, considered tubo-ovarian abscess.  Gynecology consulted.  0100 I consulted gynecology and was informed that Dr. Izell was going to be unavailable in a cesarean section.  Message was sent per his request.   I went over the results of the CT imaging findings.  She has had subjective fevers, endorses chills, is tachycardic and with a leukocytosis, technically meeting SIRS criteria.  Will cover her for PID and sepsis with broad-spectrum antibiotics.  Cultures ordered.  Pelvic exam performed and wet prep and GC chlamydia collected.  Patient was covered for PID with IV doxycycline, Flagyl , IM Rocephin.  Code sepsis initiated.  Gynecology consultation: Discussed with Dr. Izell who recommended admission and agreed with current plan of care.  Bed request placed.     Jerrol Agent, MD 12/14/24 [phone]  "

## 2024-12-15 ENCOUNTER — Inpatient Hospital Stay (HOSPITAL_COMMUNITY)

## 2024-12-15 DIAGNOSIS — N938 Other specified abnormal uterine and vaginal bleeding: Secondary | ICD-10-CM | POA: Diagnosis not present

## 2024-12-15 DIAGNOSIS — N730 Acute parametritis and pelvic cellulitis: Secondary | ICD-10-CM | POA: Diagnosis not present

## 2024-12-15 DIAGNOSIS — F32A Depression, unspecified: Secondary | ICD-10-CM | POA: Diagnosis not present

## 2024-12-15 DIAGNOSIS — D649 Anemia, unspecified: Secondary | ICD-10-CM | POA: Diagnosis not present

## 2024-12-15 DIAGNOSIS — F419 Anxiety disorder, unspecified: Secondary | ICD-10-CM | POA: Diagnosis not present

## 2024-12-15 DIAGNOSIS — N83292 Other ovarian cyst, left side: Secondary | ICD-10-CM | POA: Diagnosis not present

## 2024-12-15 DIAGNOSIS — U099 Post covid-19 condition, unspecified: Secondary | ICD-10-CM | POA: Diagnosis not present

## 2024-12-15 DIAGNOSIS — E876 Hypokalemia: Secondary | ICD-10-CM | POA: Diagnosis not present

## 2024-12-15 DIAGNOSIS — D509 Iron deficiency anemia, unspecified: Secondary | ICD-10-CM | POA: Diagnosis not present

## 2024-12-15 DIAGNOSIS — D259 Leiomyoma of uterus, unspecified: Secondary | ICD-10-CM | POA: Diagnosis not present

## 2024-12-15 DIAGNOSIS — N7093 Salpingitis and oophoritis, unspecified: Secondary | ICD-10-CM | POA: Diagnosis not present

## 2024-12-15 DIAGNOSIS — Z5329 Procedure and treatment not carried out because of patient's decision for other reasons: Secondary | ICD-10-CM | POA: Diagnosis not present

## 2024-12-15 LAB — COMPREHENSIVE METABOLIC PANEL WITH GFR
ALT: 19 U/L (ref 0–44)
AST: 16 U/L (ref 15–41)
Albumin: 2.9 g/dL — ABNORMAL LOW (ref 3.5–5.0)
Alkaline Phosphatase: 106 U/L (ref 38–126)
Anion gap: 12 (ref 5–15)
BUN: 6 mg/dL (ref 6–20)
CO2: 22 mmol/L (ref 22–32)
Calcium: 8.2 mg/dL — ABNORMAL LOW (ref 8.9–10.3)
Chloride: 105 mmol/L (ref 98–111)
Creatinine, Ser: 0.6 mg/dL (ref 0.44–1.00)
GFR, Estimated: >60 mL/min
Glucose, Bld: 114 mg/dL — ABNORMAL HIGH (ref 70–99)
Potassium: 3.4 mmol/L — ABNORMAL LOW (ref 3.5–5.1)
Sodium: 139 mmol/L (ref 135–145)
Total Bilirubin: 0.3 mg/dL (ref 0.0–1.2)
Total Protein: 5.9 g/dL — ABNORMAL LOW (ref 6.5–8.1)

## 2024-12-15 LAB — CBC WITH DIFFERENTIAL/PLATELET
Abs Immature Granulocytes: 0.23 K/uL — ABNORMAL HIGH (ref 0.00–0.07)
Basophils Absolute: 0 K/uL (ref 0.0–0.1)
Basophils Relative: 0 %
Eosinophils Absolute: 0.1 K/uL (ref 0.0–0.5)
Eosinophils Relative: 1 %
HCT: 29.3 % — ABNORMAL LOW (ref 36.0–46.0)
Hemoglobin: 9.8 g/dL — ABNORMAL LOW (ref 12.0–15.0)
Immature Granulocytes: 2 %
Lymphocytes Relative: 8 %
Lymphs Abs: 0.9 K/uL (ref 0.7–4.0)
MCH: 27.9 pg (ref 26.0–34.0)
MCHC: 33.4 g/dL (ref 30.0–36.0)
MCV: 83.5 fL (ref 80.0–100.0)
Monocytes Absolute: 0.9 K/uL (ref 0.1–1.0)
Monocytes Relative: 8 %
Neutro Abs: 8.4 K/uL — ABNORMAL HIGH (ref 1.7–7.7)
Neutrophils Relative %: 81 %
Platelets: 365 K/uL (ref 150–400)
RBC: 3.51 MIL/uL — ABNORMAL LOW (ref 3.87–5.11)
RDW: 12.8 % (ref 11.5–15.5)
WBC: 10.6 K/uL — ABNORMAL HIGH (ref 4.0–10.5)
nRBC: 0 % (ref 0.0–0.2)

## 2024-12-15 LAB — URINE CULTURE

## 2024-12-15 MED ORDER — VITAMIN B-12 1000 MCG PO TABS
1000.0000 ug | ORAL_TABLET | Freq: Every day | ORAL | Status: DC
  Administered 2024-12-15 – 2024-12-16 (×2): 1000 ug via ORAL
  Filled 2024-12-15 (×3): qty 1

## 2024-12-15 MED ORDER — CALCIUM CARBONATE 1250 (500 CA) MG PO TABS: 1250.0000 mg | ORAL_TABLET | Freq: Every day | ORAL | Status: DC

## 2024-12-15 MED ORDER — POTASSIUM CHLORIDE CRYS ER 20 MEQ PO TBCR
10.0000 meq | EXTENDED_RELEASE_TABLET | Freq: Two times a day (BID) | ORAL | Status: DC
  Administered 2024-12-15 – 2024-12-16 (×3): 10 meq via ORAL
  Filled 2024-12-15 (×4): qty 1

## 2024-12-15 MED ORDER — DOXYCYCLINE HYCLATE 100 MG PO TABS: 100.0000 mg | ORAL_TABLET | Freq: Two times a day (BID) | ORAL | Status: DC

## 2024-12-15 MED ORDER — DOXYCYCLINE HYCLATE 100 MG PO TABS
100.0000 mg | ORAL_TABLET | Freq: Two times a day (BID) | ORAL | Status: DC
  Administered 2024-12-15 – 2024-12-17 (×4): 100 mg via ORAL
  Filled 2024-12-15 (×4): qty 1

## 2024-12-15 MED ORDER — LACTATED RINGERS IV SOLN: INTRAVENOUS | Status: AC

## 2024-12-15 NOTE — Discharge Summary (Shared)
 Physician Discharge Summary  Patient ID: Kelli Cooper MRN: [id number] DOB/AGE: 06/10/1981 44 y.o.  Admit date: 12/13/2024 Discharge date: 12/15/2024  Admission Diagnoses:  Discharge Diagnoses:  Principal Problem:   Pelvic pain Active Problems:   PID (acute pelvic inflammatory disease)   Discharged Condition: {condition:18240}  Hospital Course: patient presented to [facility] ED on 12/31 with worsening lower abdominal pain, vaginal bleeding and fever. Leukocytosis of 14.5, max temp of 101.86F, tachycardia and CT/US  findings concerning for TOA and/or degenerating fibroid. Started on ceftriaxone, doxycycline and metronidazole  and transferred to [facility]. On HD#2, leukocytosis improving and pain significantly decreased. Patient started on hydroxyzine prn for anxiety. ***   Consults: {consultation:18241}  Significant Diagnostic Studies: {diagnostics:18242}  Treatments: {Tx:18249}  Discharge Exam: Blood pressure 114/70, pulse 87, temperature 98.3 F (36.8 C), temperature source Oral, resp. rate 18, height 5' 3 (1.6 m), weight 99.8 kg, last menstrual period 11/25/2024, SpO2 97%. {physical zkjf:6958869}  Disposition:    Allergies as of 12/15/2024   No Known Allergies   Med Rec must be completed prior to using this [facility]***        Signed: Stoney Karczewski 12/15/2024, 9:37 AM

## 2024-12-15 NOTE — Progress Notes (Addendum)
 Gynecology Progress Note  Admission Date: 12/13/2024 Current Date: 12/15/2024 8:14 AM  Kelli Cooper is a 44 y.o. G1P0010 HD#2 admitted for pelvic infection  History complicated by: Patient Active Problem List   Diagnosis Date Noted   Pelvic pain 12/14/2024   PID (acute pelvic inflammatory disease) 12/14/2024   Low vitamin D  level 04/05/2024   Fibroids 04/04/2024   Heavy menstrual bleeding 02/01/2024   Iron  deficiency anemia 09/17/2023   Positive ANA (antinuclear antibody) 08/06/2023   Muscle pain 07/30/2023    ROS and patient/family/surgical history, located on admission H&P note dated 12/13/2024, have been reviewed, and there are no changes except as noted below Yesterday/Overnight Events:  Transferred from [facility] for suspected PID/TOA and started on IV ABX  Subjective:  Feeling better. Had been feeling a little clammy but improved. Pain improving. Has not been up and moving much and feels muscles getting tense and would like to try to move around more. Has been able to void. Got al little lightheaded while in the shower while standing for some time. Continues to have vaginal bleeding. Hydroxyzine helped yesterday and dog was found, so overall anxiety has lessened. Has been tolerating po. More willing to continue lupron  if current pain due to infection and not reaction to injection.   Objective:   Vitals:   12/14/24 1745 12/14/24 2020 12/14/24 2330 12/15/24 0435  BP: 123/76 117/72 114/61   Pulse: (!) 106 100 (!) 104   Resp: 20 19 20    Temp: 98.9 F (37.2 C) 98.9 F (37.2 C) 100.2 F (37.9 C) 99.2 F (37.3 C)  TempSrc: Oral Oral Oral Oral  SpO2: 98% 99% 98%   Weight:      Height:        Temp:  [98.3 F (36.8 C)-100.2 F (37.9 C)] 99.2 F (37.3 C) (01/01 0435) Pulse Rate:  [97-106] 104 (12/31 2330) Resp:  [14-20] 20 (12/31 2330) BP: (105-129)/(54-80) 114/61 (12/31 2330) SpO2:  [98 %-100 %] 98 % (12/31 2330) Weight:  [99.8 kg] 99.8 kg (12/31 0938) I/O last 3  completed shifts: In: 2348.5 [P.O.:60; I.V.:772.6; IV Piggyback:1515.9] Out: 300 [Urine:300] No intake/output data recorded.  Intake/Output Summary (Last 24 hours) at 12/15/2024 0814 Last data filed at 12/14/2024 2000 Gross per 24 hour  Intake 1027.42 ml  Output 300 ml  Net 727.42 ml     Current Vital Signs 24h Vital Sign Ranges  T 99.2 F (37.3 C) Temp  Avg: 99.1 F (37.3 C)  Min: 98.3 F (36.8 C)  Max: 100.2 F (37.9 C)  BP 114/61 BP  Min: 105/54  Max: 129/77  HR (!) 104 Pulse  Avg: 100.8  Min: 97  Max: 106  RR 20 Resp  Avg: 17.8  Min: 14  Max: 20  SaO2 98 % Room Air SpO2  Avg: 99 %  Min: 98 %  Max: 100 %       24 Hour I/O Current Shift I/O  Time Ins Outs 12/31 0701 - 01/01 0700 In: 1027.4 [P.O.:60; I.V.:772.6] Out: 300 [Urine:300] No intake/output data recorded.   Patient Vitals for the past 12 hrs:  BP Temp Temp src Pulse Resp SpO2  12/15/24 0435 -- 99.2 F (37.3 C) Oral -- -- --  12/14/24 2330 114/61 100.2 F (37.9 C) Oral (!) 104 20 98 %  12/14/24 2020 117/72 98.9 F (37.2 C) Oral 100 19 99 %     Patient Vitals for the past 24 hrs:  BP Temp Temp src Pulse Resp SpO2 Height Weight  12/15/24 0435 -- 99.2 F (37.3 C) Oral -- -- -- -- --  12/14/24 2330 114/61 100.2 F (37.9 C) Oral (!) 104 20 98 % -- --  12/14/24 2020 117/72 98.9 F (37.2 C) Oral 100 19 99 % -- --  12/14/24 1745 123/76 98.9 F (37.2 C) Oral (!) 106 20 98 % -- --  12/14/24 1301 (!) 105/54 -- -- 100 -- -- -- --  12/14/24 0938 129/77 98.3 F (36.8 C) Oral 97 16 100 % 5' 3 (1.6 m) 99.8 kg  12/14/24 0851 116/80 -- -- 98 14 100 % -- --    Physical exam: General appearance: alert, cooperative, and appears stated age Abdomen: soft, slightly tender in LLQ Lungs: normal respiratory effort Extremities: no lower extremity edema, moving all extremities equally  Skin: intact Psych: appropriate Neurologic: Grossly normal  Medications Current Facility-Administered Medications  Medication Dose  Route Frequency Provider Last Rate Last Admin   acetaminophen  (TYLENOL ) tablet 650 mg  650 mg Oral Q4H PRN Carry Ortez, MD       butalbital-acetaminophen -caffeine (FIORICET) 50-325-40 MG per tablet 1 tablet  1 tablet Oral Q6H PRN Shirl Weir, MD   1 tablet at 12/14/24 1215   cefTRIAXone (ROCEPHIN) 2 g in sodium chloride  0.9 % 100 mL IVPB  2 g Intravenous Q24H Jillianne Gamino, MD 200 mL/hr at 12/15/24 0127 2 g at 12/15/24 0127   cyclobenzaprine  (FLEXERIL ) tablet 10 mg  10 mg Oral TID PRN Jerrol Agent, MD       doxycycline (VIBRAMYCIN) 100 mg in sodium chloride  0.9 % 250 mL IVPB  100 mg Intravenous Q12H Caspar Favila, MD 100 mL/hr at 12/15/24 0231 100 mg at 12/15/24 0231   ferrous sulfate  tablet 325 mg  325 mg Oral Daily Jerrol Agent, MD       HYDROmorphone (DILAUDID) injection 0.2-0.6 mg  0.2-0.6 mg Intravenous Q2H PRN Laelani Vasko, MD       hydrOXYzine (ATARAX) tablet 10 mg  10 mg Oral TID PRN Joshaua Epple, MD   10 mg at 12/14/24 2336   ibuprofen  (ADVIL ) tablet 200 mg  200 mg Oral Q6H PRN Jerrol Agent, MD       influenza vac split trivalent PF (FLUZONE) injection 0.5 mL  0.5 mL Intramuscular Tomorrow-1000 Abigail Rollo DASEN, MD       ketorolac  (TORADOL ) 30 MG/ML injection 30 mg  30 mg Intravenous Q6H Lashawnda Hancox, MD   30 mg at 12/15/24 9352   Or   ketorolac  (TORADOL ) 30 MG/ML injection 30 mg  30 mg Intramuscular Q6H Duane Trias, MD       lactated ringers infusion   Intravenous Continuous Jaecion Dempster, MD   Paused at 12/14/24 1434   metroNIDAZOLE  (FLAGYL ) tablet 500 mg  500 mg Oral Q12H Fryda Molenda, MD   500 mg at 12/15/24 0647   ondansetron  (ZOFRAN ) tablet 4 mg  4 mg Oral Q6H PRN Issai Werling, MD       Or   ondansetron  (ZOFRAN ) injection 4 mg  4 mg Intravenous Q6H PRN Ellie Spickler, MD   4 mg at 12/14/24 1222   oxyCODONE (Oxy IR/ROXICODONE) immediate release tablet 5-10 mg  5-10 mg Oral Q4H PRN Zoe Nordin, MD   10  mg at 12/15/24 0434   prenatal multivitamin tablet 1 tablet  1 tablet Oral Q1200 Jeralyn Crutch, MD          Labs  Recent Labs  Lab 12/13/24 1615 12/14/24 1457 12/15/24 0647  WBC 14.5* 11.9* 10.6*  HGB 10.8*  9.7* 9.8*  HCT 32.1* 29.3* 29.3*  PLT 417* 381 365    Recent Labs  Lab 12/13/24 1615 12/14/24 1457 12/15/24 0647  NA 139 140 139  K 3.5 3.4* 3.4*  CL 101 106 105  CO2 23 23 22   BUN 9 6 6   CREATININE 0.56 0.52 0.60  CALCIUM  9.3 8.5* 8.2*  PROT 7.4 6.2* 5.9*  BILITOT 0.4 0.5 0.3  ALKPHOS 139* 114 106  ALT 33 22 19  AST 19 17 16   GLUCOSE 108* 101* 114*   Radiology IMPRESSION: 1. Left ovarian cystic lesion measuring 7.2 x 6.0 x 6.2 cm with internal low-level echoes, with preserved ovarian Doppler flow. Differential considerations include hemorrhagic cyst, abscess or endometrioma. Follow-up imaging with repeat pelvic sonogram is advised in 3 months to ensure resolution . 2. Bilateral hydrosalpinx. Correlate for any clinical signs or symptoms of pelvic inflammatory disease. 3. Enlarged fibroid uterus, with the dominant fibroid measuring 7.3 x 6.0 x 6.3 cm. 4. Endometrial fluid with a small non-hypervascular echogenic endometrial canal lesion. In the setting of abnormal uterine bleeding, further evaluation with sonohistogram is advised.   Assessment & Plan:  Slowly improving leukocytosis and symptoms *GYN/ID: continue CTX/FLAGYL /DOXY for pelvic infection.  *Neuro: hydroxyzine for anxiety as needed. *Pain: well controlled with APAP/NSAIDs and prn opiates *FEN/GI: tolerating regular diet, d/c mIVF *PPx: SCDs and IS *Dispo: discharge later today   Code Status: Full Code   Carter Quarry, MD [facility] (Faculty Practice) 12/15/2024 8:14 AM pager [phone] cell phone [phone]

## 2024-12-16 ENCOUNTER — Encounter (HOSPITAL_COMMUNITY): Payer: Self-pay | Admitting: Obstetrics and Gynecology

## 2024-12-16 ENCOUNTER — Inpatient Hospital Stay (HOSPITAL_COMMUNITY)

## 2024-12-16 DIAGNOSIS — N730 Acute parametritis and pelvic cellulitis: Secondary | ICD-10-CM

## 2024-12-16 DIAGNOSIS — N7093 Salpingitis and oophoritis, unspecified: Secondary | ICD-10-CM | POA: Diagnosis not present

## 2024-12-16 DIAGNOSIS — U099 Post covid-19 condition, unspecified: Secondary | ICD-10-CM | POA: Diagnosis not present

## 2024-12-16 DIAGNOSIS — D500 Iron deficiency anemia secondary to blood loss (chronic): Secondary | ICD-10-CM

## 2024-12-16 DIAGNOSIS — N9489 Other specified conditions associated with female genital organs and menstrual cycle: Secondary | ICD-10-CM | POA: Diagnosis not present

## 2024-12-16 DIAGNOSIS — D259 Leiomyoma of uterus, unspecified: Secondary | ICD-10-CM | POA: Diagnosis not present

## 2024-12-16 DIAGNOSIS — E876 Hypokalemia: Secondary | ICD-10-CM | POA: Diagnosis not present

## 2024-12-16 LAB — BASIC METABOLIC PANEL WITH GFR
Anion gap: 13 (ref 5–15)
BUN: 6 mg/dL (ref 6–20)
CO2: 24 mmol/L (ref 22–32)
Calcium: 8.4 mg/dL — ABNORMAL LOW (ref 8.9–10.3)
Chloride: 101 mmol/L (ref 98–111)
Creatinine, Ser: 0.69 mg/dL (ref 0.44–1.00)
GFR, Estimated: >60 mL/min
Glucose, Bld: 121 mg/dL — ABNORMAL HIGH (ref 70–99)
Potassium: 3.3 mmol/L — ABNORMAL LOW (ref 3.5–5.1)
Sodium: 138 mmol/L (ref 135–145)

## 2024-12-16 LAB — BLOOD CULTURE ID PANEL (REFLEXED) - BCID2

## 2024-12-16 LAB — CBC
HCT: 26.7 % — ABNORMAL LOW (ref 36.0–46.0)
HCT: 29.3 % — ABNORMAL LOW (ref 36.0–46.0)
Hemoglobin: 9 g/dL — ABNORMAL LOW (ref 12.0–15.0)
Hemoglobin: 9.9 g/dL — ABNORMAL LOW (ref 12.0–15.0)
MCH: 28.3 pg (ref 26.0–34.0)
MCH: 28.4 pg (ref 26.0–34.0)
MCHC: 33.7 g/dL (ref 30.0–36.0)
MCHC: 33.8 g/dL (ref 30.0–36.0)
MCV: 83.7 fL (ref 80.0–100.0)
MCV: 84.2 fL (ref 80.0–100.0)
Platelets: 311 K/uL (ref 150–400)
Platelets: 343 K/uL (ref 150–400)
RBC: 3.17 MIL/uL — ABNORMAL LOW (ref 3.87–5.11)
RBC: 3.5 MIL/uL — ABNORMAL LOW (ref 3.87–5.11)
RDW: 12.7 % (ref 11.5–15.5)
RDW: 12.8 % (ref 11.5–15.5)
WBC: 11.9 K/uL — ABNORMAL HIGH (ref 4.0–10.5)
WBC: 9.5 K/uL (ref 4.0–10.5)
nRBC: 0 % (ref 0.0–0.2)
nRBC: 0 % (ref 0.0–0.2)

## 2024-12-16 LAB — GC/CHLAMYDIA PROBE AMP (~~LOC~~) NOT AT ARMC
Chlamydia: NEGATIVE
Comment: NEGATIVE
Comment: NORMAL
Neisseria Gonorrhea: NEGATIVE

## 2024-12-16 LAB — D-DIMER, QUANTITATIVE: D-Dimer, Quant: 5.77 ug{FEU}/mL — ABNORMAL HIGH (ref 0.00–0.50)

## 2024-12-16 LAB — LACTIC ACID, PLASMA: Lactic Acid, Venous: 1.4 mmol/L (ref 0.5–1.9)

## 2024-12-16 MED ORDER — IOHEXOL 350 MG/ML SOLN
75.0000 mL | Freq: Once | INTRAVENOUS | Status: AC | PRN
  Administered 2024-12-16: 75 mL via INTRAVENOUS

## 2024-12-16 MED ORDER — ALUM & MAG HYDROXIDE-SIMETH 200-200-20 MG/5ML PO SUSP: 15.0000 mL | ORAL | Status: DC | PRN

## 2024-12-16 MED ORDER — SODIUM CHLORIDE 0.9 % IV SOLN: INTRAVENOUS | Status: AC

## 2024-12-16 MED ORDER — LORAZEPAM 2 MG/ML IJ SOLN
1.0000 mg | Freq: Once | INTRAMUSCULAR | Status: AC
  Administered 2024-12-16: 1 mg via INTRAVENOUS
  Filled 2024-12-16: qty 1

## 2024-12-16 MED ORDER — GADOBUTROL 1 MMOL/ML IV SOLN
9.0000 mL | Freq: Once | INTRAVENOUS | Status: AC | PRN
  Administered 2024-12-16: 9 mL via INTRAVENOUS

## 2024-12-16 MED ORDER — SIMETHICONE 80 MG PO CHEW
80.0000 mg | CHEWABLE_TABLET | Freq: Four times a day (QID) | ORAL | Status: DC | PRN
  Administered 2024-12-16 – 2024-12-17 (×2): 80 mg via ORAL
  Filled 2024-12-16 (×2): qty 1

## 2024-12-16 MED ORDER — LORAZEPAM 1 MG PO TABS
1.0000 mg | ORAL_TABLET | Freq: Once | ORAL | Status: DC
  Filled 2024-12-16: qty 1

## 2024-12-16 MED ORDER — MIDAZOLAM HCL (PF) 2 MG/2ML IJ SOLN
1.0000 mg | Freq: Once | INTRAMUSCULAR | Status: DC
  Filled 2024-12-16: qty 2

## 2024-12-16 MED ORDER — IBUPROFEN 600 MG PO TABS
600.0000 mg | ORAL_TABLET | Freq: Four times a day (QID) | ORAL | Status: DC | PRN
  Administered 2024-12-17: 600 mg via ORAL
  Filled 2024-12-16: qty 1

## 2024-12-16 NOTE — Progress Notes (Signed)
 PHARMACY - PHYSICIAN COMMUNICATION CRITICAL VALUE ALERT - BLOOD CULTURE IDENTIFICATION (BCID)  Kelli Cooper is an 44 y.o. female who presented to [facility] on 12/13/2024 with a chief complaint of fever, elevated WBC, fibroid uterus.  Assessment:  43yo admitted with questionable PID and uterine fibroid spiked fever ~ 0000 1/2. Blood cultures drawn. 1/4  with staph epi (MecA resistance)(include suspected source if known)  Name of physician (or Provider) Contacted: Dr Elvie Pinal  Current antibiotics: Ceftriaxone, Doxycycline and Metronidazole   Changes to prescribed antibiotics recommended:  Continue current antibiotics with concern for contamination. If pt spikes another fever or elevated WBC, will change to vancomycin  Results for orders placed or performed during the hospital encounter of 12/13/24  Blood Culture ID Panel (Reflexed) (Collected: 12/16/2024 12:12 AM)  Result Value Ref Range   Enterococcus faecalis NOT DETECTED NOT DETECTED   Enterococcus Faecium NOT DETECTED NOT DETECTED   Listeria monocytogenes NOT DETECTED NOT DETECTED   Staphylococcus species DETECTED (A) NOT DETECTED   Staphylococcus aureus (BCID) NOT DETECTED NOT DETECTED   Staphylococcus epidermidis DETECTED (A) NOT DETECTED   Staphylococcus lugdunensis NOT DETECTED NOT DETECTED   Streptococcus species NOT DETECTED NOT DETECTED   Streptococcus agalactiae NOT DETECTED NOT DETECTED   Streptococcus pneumoniae NOT DETECTED NOT DETECTED   Streptococcus pyogenes NOT DETECTED NOT DETECTED   A.calcoaceticus-baumannii NOT DETECTED NOT DETECTED   Bacteroides fragilis NOT DETECTED NOT DETECTED   Enterobacterales NOT DETECTED NOT DETECTED   Enterobacter cloacae complex NOT DETECTED NOT DETECTED   Escherichia coli NOT DETECTED NOT DETECTED   Klebsiella aerogenes NOT DETECTED NOT DETECTED   Klebsiella oxytoca NOT DETECTED NOT DETECTED   Klebsiella pneumoniae NOT DETECTED NOT DETECTED   Proteus species NOT DETECTED  NOT DETECTED   Salmonella species NOT DETECTED NOT DETECTED   Serratia marcescens NOT DETECTED NOT DETECTED   Haemophilus influenzae NOT DETECTED NOT DETECTED   Neisseria meningitidis NOT DETECTED NOT DETECTED   Pseudomonas aeruginosa NOT DETECTED NOT DETECTED   Stenotrophomonas maltophilia NOT DETECTED NOT DETECTED   Candida albicans NOT DETECTED NOT DETECTED   Candida auris NOT DETECTED NOT DETECTED   Candida glabrata NOT DETECTED NOT DETECTED   Candida krusei NOT DETECTED NOT DETECTED   Candida parapsilosis NOT DETECTED NOT DETECTED   Candida tropicalis NOT DETECTED NOT DETECTED   Cryptococcus neoformans/gattii NOT DETECTED NOT DETECTED   Methicillin resistance mecA/C DETECTED (A) NOT DETECTED    Clayborne Alfonso MATSU 12/16/2024  9:38 PM

## 2024-12-16 NOTE — Plan of Care (Signed)

## 2024-12-16 NOTE — Progress Notes (Signed)
 Subjective: Pt was seen this morning reporting pain continues to improve but still with some LLQ pain.  Has been having nausea with eating and did not eat much until yesterday.  Had soup last night without nausea.  Still having low grade fevers.  Walking in room.  Voiding.  Has not had a BM in several days but, again, hasn't eaten much.  I'm taking over pt's care so reviewed prior findings with pt.  Pt present to ER on 12/30 with LUQ and LLQ pain.  Lipase was 21.   CBC showed elevated WBC ct at 14.5 and platelet ct of 417.  CT showed enlarged fibroid uterus with largest measuring 7.7cm and possible exophytic fibroid measuring 8.7cm.  Left complex adnexal mass noted measuring 6.1cm.  Right adnexal region with hydrosalpinx noted.  Diagnosis of PID made and she was started on rocephin IV and oral doxycycline/flagyl  as pt has difficulty with IV access due to small veins.    Last episode of intercourse was about 6 weeks ago.  GC/Chl testing from ER was negative.    She received her first dosage of Depo Lupron  on 12/05/2024.  She does have some concern that this hospitalization is related to that injection.    WBC ct has decreased since admission with most recent 9.5 at 6:25am today.  She does feel pain is improved but still present.  She has a temp of 102.4 overnight around 2300 and a low grade temp of 100.2 at 1208 today.  With that temp she became more SOB and pulse ox was down to 92%.  I was called by nursing staff.  D dimer was obtained as well at CT of chest.  This was negative for PE.    Objective: I have reviewed patient's vital signs, intake and output, medications, and labs. Vitals:   12/16/24 1215 12/16/24 1534  BP: (!) 108/52 118/74  Pulse: 93 95  Resp:  19  Temp:  98.2 F (36.8 C)  SpO2: 98% 98%    General: alert and no distress Resp: clear to auscultation bilaterally Cardio: regular rate and rhythm, S1, S2 normal, no murmur, click, rub or gallop GI: soft, with LLQ tenderness,  +BS Extremities: extremities normal, atraumatic, no cyanosis or edema Vaginal Bleeding: minimal   Assessment/Plan: Fever with elevated WBC that is improving, fibroid uterus, s/p Depo Lupron  on 12/22/22025, iron  deficiency anemia 1) continue ceftriaxone IV, doxy and flagyl  IV 2) using hydroxyzine for anxiety as needed 3)  will replace potassium orally 4) continue SCDs and IS 5) on oxycodone orally for pain which is under good control 6) given temp of 102 overnight, will proceed with pelvic MRI to better evaluate for fibroid degeneration and to better evaluate the left adnexa   LOS: 2 days    Ronal GORMAN Pinal, MD 12/16/2024, 4:05 PM

## 2024-12-17 ENCOUNTER — Inpatient Hospital Stay (HOSPITAL_COMMUNITY)

## 2024-12-17 ENCOUNTER — Inpatient Hospital Stay (HOSPITAL_COMMUNITY)
Admission: RE | Admit: 2024-12-17 | Discharge: 2024-12-20 | DRG: 759 | Disposition: A | Discharge status: Home / Self Care | Attending: Obstetrics & Gynecology | Admitting: Obstetrics & Gynecology

## 2024-12-17 ENCOUNTER — Other Ambulatory Visit (HOSPITAL_BASED_OUTPATIENT_CLINIC_OR_DEPARTMENT_OTHER): Payer: Self-pay | Admitting: Obstetrics & Gynecology

## 2024-12-17 DIAGNOSIS — E876 Hypokalemia: Secondary | ICD-10-CM | POA: Diagnosis present

## 2024-12-17 DIAGNOSIS — D649 Anemia, unspecified: Secondary | ICD-10-CM | POA: Diagnosis present

## 2024-12-17 DIAGNOSIS — N7093 Salpingitis and oophoritis, unspecified: Principal | ICD-10-CM | POA: Diagnosis present

## 2024-12-17 DIAGNOSIS — U099 Post covid-19 condition, unspecified: Secondary | ICD-10-CM | POA: Diagnosis present

## 2024-12-17 DIAGNOSIS — N9489 Other specified conditions associated with female genital organs and menstrual cycle: Secondary | ICD-10-CM | POA: Diagnosis present

## 2024-12-17 DIAGNOSIS — D250 Submucous leiomyoma of uterus: Secondary | ICD-10-CM | POA: Diagnosis present

## 2024-12-17 DIAGNOSIS — D259 Leiomyoma of uterus, unspecified: Secondary | ICD-10-CM | POA: Diagnosis present

## 2024-12-17 DIAGNOSIS — N730 Acute parametritis and pelvic cellulitis: Secondary | ICD-10-CM | POA: Diagnosis not present

## 2024-12-17 DIAGNOSIS — F419 Anxiety disorder, unspecified: Secondary | ICD-10-CM | POA: Diagnosis present

## 2024-12-17 DIAGNOSIS — Z5941 Food insecurity: Secondary | ICD-10-CM

## 2024-12-17 DIAGNOSIS — D500 Iron deficiency anemia secondary to blood loss (chronic): Secondary | ICD-10-CM | POA: Diagnosis not present

## 2024-12-17 DIAGNOSIS — D72829 Elevated white blood cell count, unspecified: Secondary | ICD-10-CM | POA: Diagnosis present

## 2024-12-17 DIAGNOSIS — Z5982 Transportation insecurity: Secondary | ICD-10-CM

## 2024-12-17 LAB — CBC
HCT: 29.6 % — ABNORMAL LOW (ref 36.0–46.0)
Hemoglobin: 9.6 g/dL — ABNORMAL LOW (ref 12.0–15.0)
MCH: 27.8 pg (ref 26.0–34.0)
MCHC: 32.4 g/dL (ref 30.0–36.0)
MCV: 85.8 fL (ref 80.0–100.0)
Platelets: 349 K/uL (ref 150–400)
RBC: 3.45 MIL/uL — ABNORMAL LOW (ref 3.87–5.11)
RDW: 12.9 % (ref 11.5–15.5)
WBC: 11.7 K/uL — ABNORMAL HIGH (ref 4.0–10.5)
nRBC: 0.2 % (ref 0.0–0.2)

## 2024-12-17 LAB — BASIC METABOLIC PANEL WITH GFR
Anion gap: 11 (ref 5–15)
BUN: 6 mg/dL (ref 6–20)
CO2: 26 mmol/L (ref 22–32)
Calcium: 8.1 mg/dL — ABNORMAL LOW (ref 8.9–10.3)
Chloride: 104 mmol/L (ref 98–111)
Creatinine, Ser: 0.58 mg/dL (ref 0.44–1.00)
GFR, Estimated: >60 mL/min
Glucose, Bld: 100 mg/dL — ABNORMAL HIGH (ref 70–99)
Potassium: 3.1 mmol/L — ABNORMAL LOW (ref 3.5–5.1)
Sodium: 141 mmol/L (ref 135–145)

## 2024-12-17 MED ORDER — METRONIDAZOLE 500 MG PO TABS: 500.0000 mg | ORAL_TABLET | Freq: Two times a day (BID) | ORAL | Status: DC

## 2024-12-17 MED ORDER — SODIUM CHLORIDE 0.9 % IV SOLN
2.0000 g | INTRAVENOUS | Status: DC
  Administered 2024-12-18 – 2024-12-20 (×3): 2 g via INTRAVENOUS
  Filled 2024-12-17 (×3): qty 20

## 2024-12-17 MED ORDER — VITAMIN B-12 1000 MCG PO TABS
1000.0000 ug | ORAL_TABLET | Freq: Every day | ORAL | Status: DC
  Administered 2024-12-19 – 2024-12-20 (×2): 1000 ug via ORAL
  Filled 2024-12-17 (×4): qty 1

## 2024-12-17 MED ORDER — MENTHOL 3 MG MT LOZG: 1.0000 | LOZENGE | OROMUCOSAL | Status: DC | PRN

## 2024-12-17 MED ORDER — FERROUS SULFATE 325 (65 FE) MG PO TABS
325.0000 mg | ORAL_TABLET | Freq: Every day | ORAL | Status: DC
  Administered 2024-12-19 – 2024-12-20 (×2): 325 mg via ORAL
  Filled 2024-12-17 (×2): qty 1

## 2024-12-17 MED ORDER — DOCUSATE SODIUM 100 MG PO CAPS
100.0000 mg | ORAL_CAPSULE | Freq: Two times a day (BID) | ORAL | Status: DC
  Filled 2024-12-17: qty 1

## 2024-12-17 MED ORDER — SIMETHICONE 80 MG PO CHEW: 80.0000 mg | CHEWABLE_TABLET | Freq: Four times a day (QID) | ORAL | Status: DC | PRN

## 2024-12-17 MED ORDER — CYCLOBENZAPRINE HCL 10 MG PO TABS
10.0000 mg | ORAL_TABLET | Freq: Three times a day (TID) | ORAL | Status: DC | PRN
  Administered 2024-12-20: 10 mg via ORAL
  Filled 2024-12-17: qty 1

## 2024-12-17 MED ORDER — SODIUM CHLORIDE 0.9% FLUSH: 3.0000 mL | INTRAVENOUS | Status: DC | PRN

## 2024-12-17 MED ORDER — OXYCODONE HCL 5 MG PO TABS
5.0000 mg | ORAL_TABLET | ORAL | Status: DC | PRN
  Administered 2024-12-17 – 2024-12-20 (×4): 5 mg via ORAL
  Filled 2024-12-17 (×3): qty 1

## 2024-12-17 MED ORDER — DOXYCYCLINE HYCLATE 100 MG PO TABS
100.0000 mg | ORAL_TABLET | Freq: Two times a day (BID) | ORAL | Status: DC
  Administered 2024-12-17 – 2024-12-20 (×6): 100 mg via ORAL
  Filled 2024-12-17 (×5): qty 1

## 2024-12-17 MED ORDER — POTASSIUM CHLORIDE 20 MEQ PO PACK: 40.0000 meq | PACK | Freq: Two times a day (BID) | ORAL | Status: DC

## 2024-12-17 MED ORDER — HYDROXYZINE HCL 10 MG PO TABS
10.0000 mg | ORAL_TABLET | Freq: Three times a day (TID) | ORAL | Status: DC | PRN
  Administered 2024-12-17 – 2024-12-20 (×4): 10 mg via ORAL
  Filled 2024-12-17 (×4): qty 1

## 2024-12-17 MED ORDER — POTASSIUM CHLORIDE 20 MEQ PO PACK
40.0000 meq | PACK | Freq: Two times a day (BID) | ORAL | Status: AC
  Administered 2024-12-17: 40 meq via ORAL
  Filled 2024-12-17: qty 2

## 2024-12-17 MED ORDER — ONDANSETRON HCL 4 MG PO TABS: 4.0000 mg | ORAL_TABLET | Freq: Four times a day (QID) | ORAL | Status: DC | PRN

## 2024-12-17 MED ORDER — ONDANSETRON HCL 4 MG/2ML IJ SOLN
4.0000 mg | Freq: Four times a day (QID) | INTRAMUSCULAR | Status: DC | PRN
  Administered 2024-12-18: 4 mg via INTRAVENOUS
  Filled 2024-12-17: qty 2

## 2024-12-17 MED ORDER — KETOROLAC TROMETHAMINE 30 MG/ML IJ SOLN: 30.0000 mg | Freq: Four times a day (QID) | INTRAMUSCULAR | Status: DC

## 2024-12-17 MED ORDER — ACETAMINOPHEN 325 MG PO TABS
650.0000 mg | ORAL_TABLET | ORAL | Status: DC | PRN
  Administered 2024-12-18 – 2024-12-19 (×3): 650 mg via ORAL
  Filled 2024-12-17 (×3): qty 2

## 2024-12-17 MED ORDER — POTASSIUM CHLORIDE CRYS ER 20 MEQ PO TBCR: 20.0000 meq | EXTENDED_RELEASE_TABLET | Freq: Two times a day (BID) | ORAL | Status: DC

## 2024-12-17 MED ORDER — SODIUM CHLORIDE 0.9% FLUSH: 3.0000 mL | Freq: Two times a day (BID) | INTRAVENOUS | Status: DC

## 2024-12-17 MED ORDER — IBUPROFEN 600 MG PO TABS
600.0000 mg | ORAL_TABLET | Freq: Four times a day (QID) | ORAL | Status: DC | PRN
  Administered 2024-12-18 – 2024-12-20 (×3): 600 mg via ORAL
  Filled 2024-12-17 (×3): qty 1

## 2024-12-17 MED ORDER — DOXYCYCLINE HYCLATE 100 MG PO TABS: 100.0000 mg | ORAL_TABLET | Freq: Two times a day (BID) | ORAL | Status: DC

## 2024-12-17 MED ORDER — BUTALBITAL-APAP-CAFFEINE 50-325-40 MG PO TABS: 1.0000 | ORAL_TABLET | Freq: Four times a day (QID) | ORAL | Status: DC | PRN

## 2024-12-17 MED ORDER — SODIUM CHLORIDE 0.9 % IV SOLN: 250.0000 mL | INTRAVENOUS | Status: AC | PRN

## 2024-12-17 MED ORDER — METRONIDAZOLE 500 MG PO TABS
500.0000 mg | ORAL_TABLET | Freq: Two times a day (BID) | ORAL | Status: DC
  Administered 2024-12-17 – 2024-12-20 (×6): 500 mg via ORAL
  Filled 2024-12-17 (×5): qty 1

## 2024-12-17 MED ORDER — ACETAMINOPHEN 325 MG PO TABS
650.0000 mg | ORAL_TABLET | ORAL | Status: DC | PRN
  Administered 2024-12-17: 650 mg via ORAL
  Filled 2024-12-17: qty 2

## 2024-12-17 MED ORDER — SODIUM CHLORIDE 0.9 % IV SOLN: 2.0000 g | INTRAVENOUS | Status: DC

## 2024-12-17 MED ORDER — KETOROLAC TROMETHAMINE 30 MG/ML IJ SOLN: 30.0000 mg | Freq: Four times a day (QID) | INTRAMUSCULAR | Status: AC | PRN

## 2024-12-17 MED ORDER — HYDROMORPHONE HCL 1 MG/ML IJ SOLN
0.2000 mg | INTRAMUSCULAR | Status: DC | PRN
  Administered 2024-12-18 (×2): 0.6 mg via INTRAVENOUS
  Filled 2024-12-17 (×2): qty 1

## 2024-12-17 MED ORDER — OXYCODONE HCL 5 MG PO TABS
10.0000 mg | ORAL_TABLET | ORAL | Status: DC | PRN
  Administered 2024-12-18 – 2024-12-20 (×6): 10 mg via ORAL
  Filled 2024-12-17 (×7): qty 2

## 2024-12-17 NOTE — Care Plan (Signed)
 Pt desires to leave AMA to check on her animals.  She was scheduled for IR drainage of TOA but pt feels she needs to leave the hospital prior to procedure being completed.  She states she is planning on returning later today.  Has been counseled that her symptoms could worsen, that she could have an accident or other issues that delay her care and return, that she may not be able to have this procedure done today even if she returns, and that insurance may deny part of all of her hospitalization.  Pt also understands we are not responsible nor is the hospital if anything happens between leaving and when/if she resumes care.  Pt voices understanding and signed AMA form.

## 2024-12-17 NOTE — H&P (Signed)
 Kelli Cooper is an 43 y.o. 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  Pt went to the ER on 12/30 with pain.  Had elevated white count, fever, abdominal/pelvic with most pain on the left who also had CT imaging showing fibroid uterus and complex cystic lesion in the LLQ measuring 6.1cm.  Ultrasound was done transabdominally showing LLQ cystic mass measuring 7.2cm with internal low level echoes.  Possible hemorrhagic cyst, abscess or endometrioma present.    She was admitted initially for PID and started on rocephin , doxycycline  and flagyl .  Clinical appearance, white count, pain and temperature did improve however she had a temp of 102 in the evening of 1/1.  MRI obtained on 1/2 showing findings more c/w TOA.  IR drainage was planned for 1/3 and pt was made NPO.    She lives alone and had concerns about the care of her pets.  She felt she needed to go and check on them.  Despite attempts to allow pt to leave and come back without full discharge, approval from leadership not obtained.  Pt felt she needed to leave AMA for this reason.  She was in the queue for IR drainage of the LLQ finding as well today.  Again, she decided leaving to check on animals was the most important so she signed the AMA form after being counseled about risks and she left.   She has not returned to complete procedure however IR will not be able to accommodate this today and she is on the schedule for tomorrow. Will need to be NPO after midnight.    Pertinent Gynecological History: Menses: currently irregular bleeding after Depo Lupron  administration on 12/22 Bleeding: dysfunctional uterine bleeding Contraception: none DES exposure: denies Sexually transmitted diseases: chlamydia in her 20's Previous GYN Procedures: bibe  Last mammogram: none done Last pap: normal Date: 04/04/2024 OB History: G1, P0, A1   Menstrual  History: Patient's last menstrual period was 11/25/2024 (approximate).    Past Medical History:  Diagnosis Date   Anemia    Arthralgia    COVID    Iron  deficiency anemia    Muscle pain    Palpitations    Positive ANA (antinuclear antibody)    Prediabetes     Past Surgical History:  Procedure Laterality Date   arm surgery Left     Family History  Problem Relation Age of Onset   Healthy Mother    Healthy Father    Healthy Sister     Social History:  reports that she has never smoked. She has been exposed to tobacco smoke. She has never used smokeless tobacco. She reports that she does not currently use alcohol. She reports current drug use. Drug: Marijuana.  Allergies: Allergies[1]  Medications Prior to Admission  Medication Sig Dispense Refill Last Dose/Taking   Blood Pressure Monitoring (OMRON 3 SERIES BP MONITOR) DEVI Use daily as directed 1 each 0    cyanocobalamin  (VITAMIN B12) 1000 MCG tablet Take 1 tablet (1,000 mcg total) by mouth daily. 30 tablet 2    cyclobenzaprine  (FLEXERIL ) 10 MG tablet Take 1 tablet (10 mg total) by mouth 3 (three) times daily as needed for muscle spasms. 30 tablet 0    ibuprofen  (ADVIL ) 200 MG tablet Take 200 mg by mouth every 6 (six) hours as needed.      Iron , Ferrous Sulfate , 325 (65 Fe) MG TABS Take 325 mg by  mouth daily. 30 tablet 1    LUPRON  DEPOT, 47-MONTH, 3.75 MG injection Inject into the muscle.      norethindrone  (MICRONOR ) 0.35 MG tablet Take 1 tablet (0.35 mg total) by mouth daily. 30 tablet 12    Vitamin D , Ergocalciferol , (DRISDOL ) 1.25 MG (50000 UNIT) CAPS capsule Take 1 capsule (50,000 Units total) by mouth every 7 (seven) days. 12 capsule 0     Review of Systems  Constitutional: Negative.   Respiratory: Negative.    Cardiovascular: Negative.   Gastrointestinal:        LLQ pain  Endocrine: Negative.   Genitourinary:  Positive for vaginal discharge.  Neurological: Negative.   Hematological: Negative.    Psychiatric/Behavioral:  The patient is nervous/anxious.     Last menstrual period 11/25/2024. Physical Exam Constitutional:      Appearance: Normal appearance.  HENT:     Head: Normocephalic and atraumatic.  Cardiovascular:     Rate and Rhythm: Normal rate and regular rhythm.  Pulmonary:     Effort: Pulmonary effort is normal.     Breath sounds: Normal breath sounds.  Abdominal:     General: Bowel sounds are normal. There is no distension.     Palpations: Abdomen is soft.     Tenderness: There is abdominal tenderness (LLQ). There is no guarding.  Musculoskeletal:        General: No swelling.  Skin:    General: Skin is warm.     Comments: Multiple erythematous skin lesions with scabs present  Neurological:     General: No focal deficit present.     Mental Status: She is alert.  Psychiatric:        Mood and Affect: Mood normal.     Results for orders placed or performed during the hospital encounter of 12/13/24 (from the past 24 hours)  Basic metabolic panel     Status: Abnormal   Collection Time: 12/17/24  4:53 AM  Result Value Ref Range   Sodium 141 135 - 145 mmol/L   Potassium 3.1 (L) 3.5 - 5.1 mmol/L   Chloride 104 98 - 111 mmol/L   CO2 26 22 - 32 mmol/L   Glucose, Bld 100 (H) 70 - 99 mg/dL   BUN 6 6 - 20 mg/dL   Creatinine, Ser 9.41 0.44 - 1.00 mg/dL   Calcium  8.1 (L) 8.9 - 10.3 mg/dL   GFR, Estimated >39 >39 mL/min   Anion gap 11 5 - 15  CBC     Status: Abnormal   Collection Time: 12/17/24  4:53 AM  Result Value Ref Range   WBC 11.7 (H) 4.0 - 10.5 K/uL   RBC 3.45 (L) 3.87 - 5.11 MIL/uL   Hemoglobin 9.6 (L) 12.0 - 15.0 g/dL   HCT 70.3 (L) 63.9 - 53.9 %   MCV 85.8 80.0 - 100.0 fL   MCH 27.8 26.0 - 34.0 pg   MCHC 32.4 30.0 - 36.0 g/dL   RDW 87.0 88.4 - 84.4 %   Platelets 349 150 - 400 K/uL   nRBC 0.2 0.0 - 0.2 %    MR PELVIS W WO CONTRAST Result Date: 12/17/2024 EXAM: MR PELVIS WITH AND WITHOUT INTRAVENOUS CONTRAST 12/16/2024 05:07:49 PM TECHNIQUE:  Multiplanar multisequence MRI of the pelvis was performed with and without intravenous contrast. CONTRAST: 9 mL of Gadavist . COMPARISON: None available. CLINICAL HISTORY: Pelvic pain. FINDINGS: UTERUS: The uterus is enlarged and fibroid, measuring 13.1 x 9.4 x 11.7 cm. Multiple uterine fibroids are identified including subserosal, intramural and submucosal  in location. Arising off the left posterior uterine fundus has a dominant fibroid measuring 5.8 cm. This is mildly T1 hyperintense, T2 isointense with diffuse internal enhancement. Adjacent exophytic subserosal fibroid with internal enhancement measures 8.5 cm (image 11.5). A right-sided submucosal fibroid measures 0.1 cm (image 18/6). Within the endometrium there is a focal lesion identified which shows diffuse enhancement measuring 2.3 x 3.5 cm (image 21/6). This is indeterminate for submucosal fibroid versus polyp versus endometrial carcinoma. The endometrial stripe measures up to 7 mm in thickness. An exophytic subserosal fibroid arising off the left anterior uterine corpus shows heterogeneous enhancement measuring 4.5 cm (image 23/5). An exophytic subserosal fibroid arising off the left lateral side of the uterus measures 3.3 cm and also shows enhancement following contrast administration. The junctional zone is within normal limits. VAGINA: The vaginal canal is unremarkable. RIGHT OVARY: A fluid-filled tubular structure is identified traversing the pelvis possibly arising from the right adnexa and extending into the left adnexa. LEFT OVARY: Within the left adnexa there is a large complex thick-walled septated structure with peripheral enhancement measuring 9.1 x 7.2 x 10.2 cm. Some peripheral increased T1 signal is noted within the structure, showing some hemorrhagic component. There is diffuse increased edema and trace fluid noted within the left adnexa surrounding the mass. Differential considerations for this mass include a complex ovarian cyst (e.g.,  hemorrhagic cyst, endometrioma), ovarian neoplasm (benign or malignant), or a tubo-ovarian abscess. FREE FLUID: Trace fluid noted within the left adnexa surrounding the mass. BLADDER AND URETHRA: Unremarkable. VISUALIZED GASTROINTESTINAL TRACT: Unremarkable. LYMPH NODES: Prominent bilateral iliac lymph nodes measuring up to 1.1 cm (image 73/103). VASCULATURE: Unremarkable. BONES: No marrow signal abnormality. IMPRESSION: 1. Large complex thick-walled septated left adnexal mass with associated tubular right-to-left adnexal structure, most suggestive of tubo-ovarian abscess/pyosalpinx; differential includes endometrioma and ovarian neoplasm, and gynecology consultation is recommended with short-interval follow-up pelvic ultrasound or MRI in 6-8 weeks (or sooner if symptoms worsen) to document resolution. 2. Prominent bilateral iliac lymph nodes up to 1.1 cm, likely reactive in the setting of tubovarian abscess 3. Enhancing endometrial lesion, indeterminate for polyp, submucosal fibroid, or endometrial carcinoma, and recommend hysteroscopy with tissue sampling. 4. Enlarged fibroid uterus. Electronically signed by: Waddell Calk MD 12/17/2024 08:05 AM EST RP Workstation: [id number]   CT Angio Chest Pulmonary Embolism (PE) W or WO Contrast Result Date: 12/16/2024 CLINICAL DATA:  Positive D-dimer EXAM: CT ANGIOGRAPHY CHEST WITH CONTRAST TECHNIQUE: Multidetector CT imaging of the chest was performed using the standard protocol during bolus administration of intravenous contrast. Multiplanar CT image reconstructions and MIPs were obtained to evaluate the vascular anatomy. RADIATION DOSE REDUCTION: This exam was performed according to the departmental dose-optimization program which includes automated exposure control, adjustment of the mA and/or kV according to patient size and/or use of iterative reconstruction technique. CONTRAST:  75mL OMNIPAQUE  IOHEXOL  350 MG/ML SOLN COMPARISON:  Yesterday FINDINGS: Cardiovascular:  Satisfactory opacification of the pulmonary arteries to the segmental level. No evidence of pulmonary embolism. Normal heart size. No pericardial effusion. Mediastinum/Nodes: No enlarged mediastinal, hilar, or axillary lymph nodes. Thyroid  gland, trachea, and esophagus demonstrate no significant findings. Lungs/Pleura: No pneumothorax or pleural effusion is noted. Minimal bibasilar subsegmental atelectasis is noted. Upper Abdomen: No acute abnormality. Musculoskeletal: No chest wall abnormality. No acute or significant osseous findings. Review of the MIP images confirms the above findings. IMPRESSION: No definite evidence of pulmonary embolus. Minimal bibasilar subsegmental atelectasis. Electronically Signed   By: Lynwood Landy Raddle M.D.   On: 12/16/2024 14:30  DG CHEST PORT 1 VIEW Result Date: 12/15/2024 EXAM: 1 VIEW(S) XRAY OF THE CHEST 12/15/2024 11:00:00 PM COMPARISON: None available. CLINICAL HISTORY: Chest pain [phone]; Oxygen desaturation 242050 FINDINGS: LUNGS AND PLEURA: Low lung volumes. No focal pulmonary opacity. No pleural effusion. No pneumothorax. HEART AND MEDIASTINUM: Metallic density projecting over thoracic inlet, presumably external to patient. No acute abnormality of the cardiac and mediastinal silhouettes. BONES AND SOFT TISSUES: No acute osseous abnormality. IMPRESSION: 1. No acute cardiopulmonary abnormality. 2. Low lung volumes. 3. Metallic density overlying the thoracic inlet, favored external to the patient. Electronically signed by: Greig Pique MD 12/15/2024 11:34 PM EST RP Workstation: [id number]    Assessment/Plan: 44 yo G1PA1 SWF with h/o LLQ cystic mass most c/w TOA, elevated WBC ct despite antibiotics for >72 hours, planning IR drainage of cyst mass tomorrow.  Admission orders completed.    Ronal GORMAN Pinal 12/17/2024, 2:57 PM     [1] No Known Allergies

## 2024-12-17 NOTE — Progress Notes (Signed)
 Patient ID: Kelli Cooper, female   DOB: [birth year], 44 y.o.   MRN: [id number]  Called by nursing staff as pt has temp of 100.6.  Has IR drainage of TOA planned for tomorrow.  Will be NPO after midnight.  Ibuprofen  order placed and toradol  order made prn for pt if needed after she is NPO and has IV access.    Spoke with pharmacy as preliminary blood culture was positive but not this has more specific results of Staph hominis and Staph haemolyticus which appear to be contaminants.  Change in antibiotics not indicated at this time.  Communicated with nursing staff.

## 2024-12-17 NOTE — Progress Notes (Addendum)
 Subjective: Patient reports continued LLQ pain.  Did eat yesterday and tolerated this well.  Voiding well.  Walking in room.  Still with light vaginal bleeding.  Reviewed MRI findings with pt.  Have reviewed imaging personally.  Feel IR drainage is appropriate as WBC ct is 11.7K this morning.  Have spoke with Dr. Jenna with IR personally as well.  No fevers overnight.    Pt very worried about her pets.  Will see if can help with this today.  Also, single blood culture was positive.  Possible contaminant.  Have reviewed with pharmacy as well.  If has another temp, will change abx and add vancomycin.  Objective: I have reviewed patient's vital signs, intake and output, medications, and labs.  Vitals:   12/16/24 2030 12/17/24 0017 12/17/24 0355 12/17/24 0823  BP:  111/70 114/82 108/66  Pulse:  (!) 107 86 89  Temp:  98.8 F (37.1 C) 98.1 F (36.7 C) 98.7 F (37.1 C)  Resp:  17 18 18   Height:      Weight:      SpO2: 97% 98% 100% 100%  TempSrc:  Oral Oral Oral  BMI (Calculated):          Latest Ref Rng & Units 12/17/2024    4:53 AM 12/16/2024    5:53 AM 12/16/2024   12:12 AM  CBC  WBC 4.0 - 10.5 K/uL 11.7  9.5  11.9   Hemoglobin 12.0 - 15.0 g/dL 9.6  9.0  9.9   Hematocrit 36.0 - 46.0 % 29.6  26.7  29.3   Platelets 150 - 400 K/uL 349  311  343       Latest Ref Rng & Units 12/17/2024    4:53 AM 12/16/2024    6:42 AM 12/15/2024    6:47 AM  BMP  Glucose 70 - 99 mg/dL 899  878  885   BUN 6 - 20 mg/dL 6  6  6    Creatinine 0.44 - 1.00 mg/dL 9.41  9.30  9.39   Sodium 135 - 145 mmol/L 141  138  139   Potassium 3.5 - 5.1 mmol/L 3.1  3.3  3.4   Chloride 98 - 111 mmol/L 104  101  105   CO2 22 - 32 mmol/L 26  24  22    Calcium  8.9 - 10.3 mg/dL 8.1  8.4  8.2     General: alert and no distress Resp: clear to auscultation bilaterally Cardio: regular rate and rhythm, S1, S2 normal, no murmur, click, rub or gallop GI: +BS, soft but tender to palpation along left side.  No rebound. Extremities:  extremities normal, atraumatic, no cyanosis or edema Vaginal Bleeding: dark blood on pad   Assessment/Plan: 1) TOA.  On correct antibiotics.  Have spoke with Dr. Jenna with IR about possible drainage.    2) single positive blood culture.  Possible contaminant.  If she has another temp, will add vancomycin.  D/w pt today.  Will check another blood culture today as well.  3) Hypokalemia.  Will replace with increased oral potassium.  4) Anemia.  Hb 9.6.  Outpatient iron  infusions are planned.  5) fibroid uterus.  MRI did not show degenerating fibroids.    6) Prophylaxis.  On SCDs and using IS.  Reinforced today.  7) Anxiety:  Has hydroxyzine  ordered   LOS: 3 days    Ronal GORMAN Pinal, MD 12/17/2024, 10:17 AM

## 2024-12-17 NOTE — Care Plan (Signed)
 Pt very upset about dog and cat at home.  Hospitalized longer than she expected when she went to the ER on 12/30.  Dog got out yesterday when a friend went to feed her animals.  Did come back but she feels she needs to go an assess.  Lives 9 minutes from hospital.  Has friend who can pick her up and bring her back.  Pt aware hospital, provider and nursing staff cannot be responsible for anything that happens while she is not in the hospital.  Currently has a saline locked IV and this will be wrapped and protected.  Spoke with Greig Norrie, RN and [facility] for the day who recommended this plan vs having pt sign AMA forms, be discharged and then readmitted.  RN caring for pt, Luke Stark, was on phone with [facility] when we call discussed this plan in front of patient.  We are all in agreement regarding plan and time pt will be out of hospital.

## 2024-12-17 NOTE — Plan of Care (Signed)

## 2024-12-17 NOTE — Progress Notes (Signed)
 There was a consult for placing PIV access, but patient needs it at 2 am for antibiotics with blood work. Recommended patient's nurse to put in the new consult near time at 2 am, so can get blood work at the same time. If placing PIV access now, can't get blood work for tomorrow. Patient's nurse agreed with it. HS [employer]

## 2024-12-18 ENCOUNTER — Ambulatory Visit (HOSPITAL_BASED_OUTPATIENT_CLINIC_OR_DEPARTMENT_OTHER): Payer: Self-pay | Admitting: Obstetrics & Gynecology

## 2024-12-18 ENCOUNTER — Other Ambulatory Visit (HOSPITAL_COMMUNITY): Payer: Self-pay

## 2024-12-18 ENCOUNTER — Inpatient Hospital Stay (HOSPITAL_COMMUNITY)

## 2024-12-18 DIAGNOSIS — F419 Anxiety disorder, unspecified: Secondary | ICD-10-CM | POA: Diagnosis not present

## 2024-12-18 DIAGNOSIS — N7093 Salpingitis and oophoritis, unspecified: Secondary | ICD-10-CM

## 2024-12-18 DIAGNOSIS — E876 Hypokalemia: Secondary | ICD-10-CM

## 2024-12-18 DIAGNOSIS — D500 Iron deficiency anemia secondary to blood loss (chronic): Secondary | ICD-10-CM | POA: Diagnosis not present

## 2024-12-18 DIAGNOSIS — D259 Leiomyoma of uterus, unspecified: Secondary | ICD-10-CM

## 2024-12-18 LAB — BASIC METABOLIC PANEL WITH GFR
Anion gap: 10 (ref 5–15)
BUN: 7 mg/dL (ref 6–20)
CO2: 25 mmol/L (ref 22–32)
Calcium: 8.2 mg/dL — ABNORMAL LOW (ref 8.9–10.3)
Chloride: 105 mmol/L (ref 98–111)
Creatinine, Ser: 0.58 mg/dL (ref 0.44–1.00)
GFR, Estimated: >60 mL/min
Glucose, Bld: 143 mg/dL — ABNORMAL HIGH (ref 70–99)
Potassium: 3.5 mmol/L (ref 3.5–5.1)
Sodium: 140 mmol/L (ref 135–145)

## 2024-12-18 LAB — CBC
HCT: 23 % — ABNORMAL LOW (ref 36.0–46.0)
Hemoglobin: 7.5 g/dL — ABNORMAL LOW (ref 12.0–15.0)
MCH: 28 pg (ref 26.0–34.0)
MCHC: 32.6 g/dL (ref 30.0–36.0)
MCV: 85.8 fL (ref 80.0–100.0)
Platelets: 406 K/uL — ABNORMAL HIGH (ref 150–400)
RBC: 2.68 MIL/uL — ABNORMAL LOW (ref 3.87–5.11)
RDW: 13 % (ref 11.5–15.5)
WBC: 15.2 K/uL — ABNORMAL HIGH (ref 4.0–10.5)
nRBC: 0 % (ref 0.0–0.2)

## 2024-12-18 LAB — PROTIME-INR
INR: 1.3 — ABNORMAL HIGH (ref 0.8–1.2)
Prothrombin Time: 16.7 s — ABNORMAL HIGH (ref 11.4–15.2)

## 2024-12-18 MED ORDER — FENTANYL CITRATE (PF) 100 MCG/2ML IJ SOLN
INTRAMUSCULAR | Status: AC | PRN
  Administered 2024-12-18: 50 ug via INTRAVENOUS
  Administered 2024-12-18 (×3): 25 ug via INTRAVENOUS
  Administered 2024-12-18: 50 ug via INTRAVENOUS
  Administered 2024-12-18: 25 ug via INTRAVENOUS

## 2024-12-18 MED ORDER — MIDAZOLAM HCL 2 MG/2ML IJ SOLN
INTRAMUSCULAR | Status: AC
  Filled 2024-12-18: qty 2

## 2024-12-18 MED ORDER — SODIUM CHLORIDE FLUSH 0.9 % IV SOLN
5.0000 mL | Freq: Every day | INTRAVENOUS | 0 refills | Status: AC
  Filled 2024-12-18: qty 600, 120d supply, fill #0

## 2024-12-18 MED ORDER — SODIUM CHLORIDE 0.9% FLUSH
5.0000 mL | Freq: Three times a day (TID) | INTRAVENOUS | Status: DC
  Administered 2024-12-18 – 2024-12-20 (×6): 5 mL

## 2024-12-18 MED ORDER — DOCUSATE SODIUM 100 MG PO CAPS: 100.0000 mg | ORAL_CAPSULE | Freq: Every day | ORAL | Status: DC | PRN

## 2024-12-18 MED ORDER — SODIUM CHLORIDE FLUSH 0.9 % IV SOLN
5.0000 mL | Freq: Every day | INTRAVENOUS | 0 refills | Status: DC
  Filled 2024-12-18: qty 600, 120d supply, fill #0

## 2024-12-18 MED ORDER — SODIUM CHLORIDE 0.9% FLUSH: 5.0000 mL | Freq: Three times a day (TID) | INTRAVENOUS | Status: DC

## 2024-12-18 MED ORDER — FENTANYL CITRATE (PF) 100 MCG/2ML IJ SOLN
INTRAMUSCULAR | Status: AC
  Filled 2024-12-18: qty 2

## 2024-12-18 MED ORDER — MIDAZOLAM HCL (PF) 2 MG/2ML IJ SOLN
INTRAMUSCULAR | Status: AC | PRN
  Administered 2024-12-18 (×2): .5 mg via INTRAVENOUS
  Administered 2024-12-18: 1 mg via INTRAVENOUS

## 2024-12-18 NOTE — Procedures (Signed)
 Pre procedural Dx: TOA left side Post procedural Dx: Same  Technically successful CT guided placed of a 10 Fr drainage catheter placement into the TOA yielding blood and pus.    A representative aspirated sample was capped and sent to the laboratory for analysis.    EBL: Trace Complications: None immediate  Kelli Banner, MD Pager #: [phone]

## 2024-12-18 NOTE — Plan of Care (Signed)

## 2024-12-18 NOTE — Progress Notes (Addendum)
 Subjective: Patient reports increased pain this morning.  Has requested pain medication.  Is on the schedule for IR drainage of LLQ probable TOA.  Has running IV.  WBC ct is increased this morning.  Potassium improved.  Blood culture appears to be a skin contaminant.  Pt did have temp of 100.6 last night.  Also had BM yesterday.    Objective: I have reviewed patient's vital signs, intake and output, medications, and labs.  General: alert and no distress Resp: clear to auscultation bilaterally Cardio: regular rate and rhythm, S1, S2 normal, no murmur, click, rub or gallop GI: soft but increased tenderness and guarding in LLQ, no rebound, +BS Extremities: extremities normal, atraumatic, no cyanosis or edema Vaginal Bleeding: minimal   Assessment/Plan: 1) TOA.  On IV rocephin  and oral doxy and flagyl .  IR drainage scheduled today.  NPO.  WBC ct is increased today and she is having more pain.    2) Single blood culture.  This is a contaminant so no change in antibiotics needed.  Reviewed with pharmacy overnight.  3) hypokalemia.  Improved.  Will continue daily dosing of K for now.  4) Anemia.  On oral iron .  5) prophylaxis.  Has SCDs and IS.  6) Anxiety.  Hydroxyzine  ordered.  7) Fibroid uterus.     LOS: 1 day    Ronal GORMAN Pinal, MD 12/18/2024, 8:44 AM

## 2024-12-18 NOTE — Consult Note (Signed)
 "   Chief Complaint: TOA  Referring Provider(s): Dr. Cleotilde  Supervising Physician: Jenna Hacker  Patient Status: [facility] - In-pt  History of Present Illness: Kelli Cooper is a 44 y.o. female with PMH significant for anemia, prediabetes, long Covid, uterine fibroid. She presented 12/13/24 to the ER with L sided abd pain and fever with elevated WBC noted on work up, continues to increase on today's labs. MR imaging showed large complex thick-walled septated left adnexal mass with associated tubular right-to-left adnexal structure, most suggestive of tubo-ovarian abscess/pyosalpinx.  IR consulted for aspiration and possible drain placement for TOA.   Confirms NPO since MN.  Does not wear CPAP or use supplemental home O2.    Allergies Reviewed:  Patient has no known allergies.   Patient is Full Code  Past Medical History:  Diagnosis Date   Anemia    Arthralgia    COVID    Iron  deficiency anemia    Muscle pain    Palpitations    Positive ANA (antinuclear antibody)    Prediabetes     Past Surgical History:  Procedure Laterality Date   arm surgery Left       Medications: Prior to Admission medications  Medication Sig Start Date End Date Taking? Authorizing Provider  Blood Pressure Monitoring (OMRON 3 SERIES BP MONITOR) DEVI Use daily as directed 08/11/24   Oley Bascom RAMAN, NP  cyanocobalamin  (VITAMIN B12) 1000 MCG tablet Take 1 tablet (1,000 mcg total) by mouth daily. 11/06/23   Oley Bascom RAMAN, NP  cyclobenzaprine  (FLEXERIL ) 10 MG tablet Take 1 tablet (10 mg total) by mouth 3 (three) times daily as needed for muscle spasms. 10/13/24   Oley Bascom RAMAN, NP  ibuprofen  (ADVIL ) 200 MG tablet Take 200 mg by mouth every 6 (six) hours as needed.    [provider]  Iron , Ferrous Sulfate , 325 (65 Fe) MG TABS Take 325 mg by mouth daily. 11/06/23   Oley Bascom RAMAN, NP  LUPRON  DEPOT, 78-MONTH, 3.75 MG injection Inject into the muscle. 11/16/24   [provider]  norethindrone  (MICRONOR ) 0.35 MG tablet Take 1 tablet (0.35 mg total) by mouth daily. 04/04/24   Lo, Arland POUR, CNM  Vitamin D , Ergocalciferol , (DRISDOL ) 1.25 MG (50000 UNIT) CAPS capsule Take 1 capsule (50,000 Units total) by mouth every 7 (seven) days. 06/29/24   Cleotilde Ronal RAMAN, MD     Family History  Problem Relation Age of Onset   Healthy Mother    Healthy Father    Healthy Sister     Social History   Socioeconomic History   Marital status: Single    Spouse name: Not on file   Number of children: Not on file   Years of education: Not on file   Highest education level: Not on file  Occupational History   Occupation: Teach private voice lessons  Tobacco Use   Smoking status: Never    Passive exposure: Past   Smokeless tobacco: Never  Vaping Use   Vaping status: Former  Substance and Sexual Activity   Alcohol use: Not Currently   Drug use: Yes    Types: Marijuana    Comment: daily   Sexual activity: Yes  Other Topics Concern   Not on file  Social History Narrative   Not on file   Social Drivers of Health   Tobacco Use: Low Risk (12/14/2024)   Patient History    Smoking Tobacco Use: Never    Smokeless Tobacco Use: Never    Passive  Exposure: Past  Physicist, Medical Strain: Not on file  Food Insecurity: Food Insecurity Present (12/14/2024)   Epic    Worried About Programme Researcher, Broadcasting/film/video in the Last Year: Sometimes true    Ran Out of Food in the Last Year: Sometimes true  Transportation Needs: Unmet Transportation Needs (12/14/2024)   Epic    Lack of Transportation (Medical): Yes    Lack of Transportation (Non-Medical): Yes  Physical Activity: Not on file  Stress: Not on file  Social Connections: Not on file  Depression (PHQ2-9): Low Risk (05/25/2024)   Depression (PHQ2-9)    PHQ-2 Score: 0  Alcohol Screen: Not on file  Housing: Low Risk (12/14/2024)   Epic    Unable to Pay for Housing in the Last Year: No    Number of Times Moved in the Last  Year: 0    Homeless in the Last Year: No  Utilities: Not At Risk (12/14/2024)   Epic    Threatened with loss of utilities: No  Health Literacy: Not on file     Review of Systems: A 12 point ROS discussed and pertinent positives are indicated in the HPI above.  All other systems are negative.  Review of Systems  Constitutional:  Positive for appetite change, fatigue and fever. Negative for chills.  Respiratory:  Positive for chest tightness.        Patient notes that many of her sx are related to long covid  Gastrointestinal:  Positive for abdominal pain, constipation and nausea. Negative for blood in stool, diarrhea and vomiting.  Genitourinary:  Positive for vaginal bleeding. Negative for hematuria.    Vital Signs: BP 128/74 (BP Location: Left Arm)   Pulse 87   Temp 98.9 F (37.2 C) (Oral)   Resp 20   LMP 11/25/2024 (Approximate)   SpO2 97%     Physical Exam Constitutional:      General: She is not in acute distress. HENT:     Mouth/Throat:     Mouth: Mucous membranes are moist.     Pharynx: Oropharynx is clear.  Cardiovascular:     Rate and Rhythm: Normal rate and regular rhythm.     Heart sounds: Normal heart sounds.  Pulmonary:     Effort: Pulmonary effort is normal.     Breath sounds: Normal breath sounds.  Abdominal:     General: There is no distension.     Palpations: Abdomen is soft.     Tenderness: There is abdominal tenderness.     Comments: LLQ  Skin:    General: Skin is warm and dry.     Comments: Small, scabbed wounds over abd, primarily R abd which patient tells me is related to cystic acne. Not expected to interfere with planned puncture.   Neurological:     Mental Status: She is alert and oriented to person, place, and time.  Psychiatric:        Mood and Affect: Mood normal.        Behavior: Behavior normal.        Thought Content: Thought content normal.        Judgment: Judgment normal.     Imaging: MR PELVIS W WO CONTRAST Result Date:  12/17/2024 EXAM: MR PELVIS WITH AND WITHOUT INTRAVENOUS CONTRAST 12/16/2024 05:07:49 PM TECHNIQUE: Multiplanar multisequence MRI of the pelvis was performed with and without intravenous contrast. CONTRAST: 9 mL of Gadavist . COMPARISON: None available. CLINICAL HISTORY: Pelvic pain. FINDINGS: UTERUS: The uterus is enlarged and fibroid, measuring 13.1 x 9.4  x 11.7 cm. Multiple uterine fibroids are identified including subserosal, intramural and submucosal in location. Arising off the left posterior uterine fundus has a dominant fibroid measuring 5.8 cm. This is mildly T1 hyperintense, T2 isointense with diffuse internal enhancement. Adjacent exophytic subserosal fibroid with internal enhancement measures 8.5 cm (image 11.5). A right-sided submucosal fibroid measures 0.1 cm (image 18/6). Within the endometrium there is a focal lesion identified which shows diffuse enhancement measuring 2.3 x 3.5 cm (image 21/6). This is indeterminate for submucosal fibroid versus polyp versus endometrial carcinoma. The endometrial stripe measures up to 7 mm in thickness. An exophytic subserosal fibroid arising off the left anterior uterine corpus shows heterogeneous enhancement measuring 4.5 cm (image 23/5). An exophytic subserosal fibroid arising off the left lateral side of the uterus measures 3.3 cm and also shows enhancement following contrast administration. The junctional zone is within normal limits. VAGINA: The vaginal canal is unremarkable. RIGHT OVARY: A fluid-filled tubular structure is identified traversing the pelvis possibly arising from the right adnexa and extending into the left adnexa. LEFT OVARY: Within the left adnexa there is a large complex thick-walled septated structure with peripheral enhancement measuring 9.1 x 7.2 x 10.2 cm. Some peripheral increased T1 signal is noted within the structure, showing some hemorrhagic component. There is diffuse increased edema and trace fluid noted within the left adnexa  surrounding the mass. Differential considerations for this mass include a complex ovarian cyst (e.g., hemorrhagic cyst, endometrioma), ovarian neoplasm (benign or malignant), or a tubo-ovarian abscess. FREE FLUID: Trace fluid noted within the left adnexa surrounding the mass. BLADDER AND URETHRA: Unremarkable. VISUALIZED GASTROINTESTINAL TRACT: Unremarkable. LYMPH NODES: Prominent bilateral iliac lymph nodes measuring up to 1.1 cm (image 73/103). VASCULATURE: Unremarkable. BONES: No marrow signal abnormality. IMPRESSION: 1. Large complex thick-walled septated left adnexal mass with associated tubular right-to-left adnexal structure, most suggestive of tubo-ovarian abscess/pyosalpinx; differential includes endometrioma and ovarian neoplasm, and gynecology consultation is recommended with short-interval follow-up pelvic ultrasound or MRI in 6-8 weeks (or sooner if symptoms worsen) to document resolution. 2. Prominent bilateral iliac lymph nodes up to 1.1 cm, likely reactive in the setting of tubovarian abscess 3. Enhancing endometrial lesion, indeterminate for polyp, submucosal fibroid, or endometrial carcinoma, and recommend hysteroscopy with tissue sampling. 4. Enlarged fibroid uterus. Electronically signed by: Waddell Calk MD 12/17/2024 08:05 AM EST RP Workstation: [id number]   CT Angio Chest Pulmonary Embolism (PE) W or WO Contrast Result Date: 12/16/2024 CLINICAL DATA:  Positive D-dimer EXAM: CT ANGIOGRAPHY CHEST WITH CONTRAST TECHNIQUE: Multidetector CT imaging of the chest was performed using the standard protocol during bolus administration of intravenous contrast. Multiplanar CT image reconstructions and MIPs were obtained to evaluate the vascular anatomy. RADIATION DOSE REDUCTION: This exam was performed according to the departmental dose-optimization program which includes automated exposure control, adjustment of the mA and/or kV according to patient size and/or use of iterative reconstruction  technique. CONTRAST:  75mL OMNIPAQUE  IOHEXOL  350 MG/ML SOLN COMPARISON:  Yesterday FINDINGS: Cardiovascular: Satisfactory opacification of the pulmonary arteries to the segmental level. No evidence of pulmonary embolism. Normal heart size. No pericardial effusion. Mediastinum/Nodes: No enlarged mediastinal, hilar, or axillary lymph nodes. Thyroid  gland, trachea, and esophagus demonstrate no significant findings. Lungs/Pleura: No pneumothorax or pleural effusion is noted. Minimal bibasilar subsegmental atelectasis is noted. Upper Abdomen: No acute abnormality. Musculoskeletal: No chest wall abnormality. No acute or significant osseous findings. Review of the MIP images confirms the above findings. IMPRESSION: No definite evidence of pulmonary embolus. Minimal bibasilar subsegmental atelectasis. Electronically Signed  By: Lynwood Landy Raddle M.D.   On: 12/16/2024 14:30   DG CHEST PORT 1 VIEW Result Date: 12/15/2024 EXAM: 1 VIEW(S) XRAY OF THE CHEST 12/15/2024 11:00:00 PM COMPARISON: None available. CLINICAL HISTORY: Chest pain [phone]; Oxygen desaturation 242050 FINDINGS: LUNGS AND PLEURA: Low lung volumes. No focal pulmonary opacity. No pleural effusion. No pneumothorax. HEART AND MEDIASTINUM: Metallic density projecting over thoracic inlet, presumably external to patient. No acute abnormality of the cardiac and mediastinal silhouettes. BONES AND SOFT TISSUES: No acute osseous abnormality. IMPRESSION: 1. No acute cardiopulmonary abnormality. 2. Low lung volumes. 3. Metallic density overlying the thoracic inlet, favored external to the patient. Electronically signed by: Greig Pique MD 12/15/2024 11:34 PM EST RP Workstation: [id number]   US  PELVIC TRANSABD W/PELVIC DOPPLER Result Date: 12/14/2024 EXAM: PELVIC ULTRASOUND TECHNIQUE: Transabdominal pelvic duplex ultrasound using B-mode/gray scaled imaging with Doppler spectral analysis and color flow was obtained. COMPARISON: CT abdomen and pelvis 12/13/2024. CLINICAL  HISTORY: Pelvic pain. FINDINGS: ULTRASOUND FINDINGS: UTERUS: The uterus anteflexed uterus measures 15.7 x 7.9 x 10.2 cm with a volume of 659 cc. Multiple fibroids are identified. The largest fibroid is exophytic arising off the right side of the uterine fundus measuring 7.3 x 6.0 x 6.3 cm. Within the mid fundal region, there is an intramural fibroid measuring 6.2 x 5.8 x 6.4 cm. Along the left in the anterior fundal region, there is a fibroid measuring 3.9 x 3.1 x 4.7 cm. Within the right posterior uterine corpus, there is a fibroid measuring 4.3 x 3.7 x 4.6 cm. ENDOMETRIAL STRIPE: The endometrium measures 4 mm in thickness. Fluid is identified within the endometrial canal as well as a small, non-hypervascular echogenic structure measuring 1.5 x 0.2 x 1.5 cm. RIGHT OVARY: The right ovary is not visualized and is obscured by overlying bowel gas. LEFT OVARY: The left ovary measures 11.1 x 8.7 x 9.6 cm with a volume of 483.3 cc. Within the left ovary, there is a cystic structure containing diffuse low-level internal echoes measuring 7.2 x 6.0 x 6.2 cm. Normal color Doppler, arterial and venous waveforms within the left ovary. ADNEXA: Bilateral hydrosalpinx identified. FREE FLUID: No free fluid. IMPRESSION: 1. Left ovarian cystic lesion measuring 7.2 x 6.0 x 6.2 cm with internal low-level echoes, with preserved ovarian Doppler flow. Differential considerations include hemorrhagic cyst, abscess or endometrioma. Follow-up imaging with repeat pelvic sonogram is advised in 3 months to ensure resolution . 2. Bilateral hydrosalpinx. Correlate for any clinical signs or symptoms of pelvic inflammatory disease. 3. Enlarged fibroid uterus, with the dominant fibroid measuring 7.3 x 6.0 x 6.3 cm. 4. Endometrial fluid with a small non-hypervascular echogenic endometrial canal lesion. In the setting of abnormal uterine bleeding, further evaluation with sonohistogram is advised. Electronically signed by: Waddell Calk MD 12/14/2024  06:23 AM EST RP Workstation: [id number]   CT ABDOMEN PELVIS W CONTRAST Result Date: 12/14/2024 EXAM: CT ABDOMEN AND PELVIS WITH CONTRAST 12/13/2024 10:10:00 PM TECHNIQUE: CT of the abdomen and pelvis was performed with the administration of 100 mL of iohexol  (OMNIPAQUE ) 300 MG/ML solution. Multiplanar reformatted images are provided for review. Automated exposure control, iterative reconstruction, and/or weight-based adjustment of the mA/kV was utilized to reduce the radiation dose to as low as reasonably achievable. COMPARISON: CT abdomen and pelvis 02/04/2018, cough and pelvic ultrasound 02/04/2018. CLINICAL HISTORY: LLQ abdominal pain. FINDINGS: LOWER CHEST: No acute abnormality. LIVER: The liver is mildly enlarged. GALLBLADDER AND BILE DUCTS: Gallbladder is unremarkable. No biliary ductal dilatation. SPLEEN: No acute abnormality. PANCREAS: No acute abnormality.  ADRENAL GLANDS: No acute abnormality. KIDNEYS, URETERS AND BLADDER: No stones in the kidneys or ureters. No hydronephrosis. No perinephric or periureteral stranding. Urinary bladder is unremarkable. GI AND BOWEL: Stomach demonstrates no acute abnormality. The appendix appears normal. There is no bowel obstruction. PERITONEUM AND RETROPERITONEUM: No ascites. No free air. VASCULATURE: Aorta is normal in caliber. LYMPH NODES: No lymphadenopathy. REPRODUCTIVE ORGANS: The uterus is enlarged and lobulated, likely containing multiple fibroids. These fibroids have increased in size, now measuring up to 7.7 cm. There is a partially calcified mass in the right lower quadrant abutting the uterus which may represent an exophytic fibroid measuring 8.7 cm, which has increased in size. Complex cystic mass in the left adnexa with dominant cystic area measuring 6.1 cm has increased in size when compared to the prior study. Small cystic areas are dilated tubules noted in the right adnexa. Separate right adnexa/ovarian mass is not excluded. BONES AND SOFT TISSUES: Bones  are mildly enlarged. No focal soft tissue abnormality. IMPRESSION: 1. Enlarged and lobulated uterus with multiple fibroids, the largest measuring 7.7 cm, increased in size compared to prior study. 2. Complex cystic mass in the left adnexa with dominant cystic area measuring 6.1 cm, increased in size compared to prior study. 3. Dilated tubular cystic structures in the right adnexa, which may represent hydrosalpinx. 4. Partially calcified right lower quadrant mass abutting the uterus, favored to represent an exophytic fibroid measuring 8.7 cm, increased in size compared to prior study, although a separate right adnexal mass is not excluded. 5. Pelvic ultrasound or pelvic MRI may help further delineate. Electronically signed by: Greig Pique MD 12/14/2024 12:16 AM EST RP Workstation: [id number]   MR LUMBAR SPINE WO CONTRAST Result Date: 12/04/2024 CLINICAL DATA:  Initial evaluation for lower back pain. EXAM: MRI LUMBAR SPINE WITHOUT CONTRAST TECHNIQUE: Multiplanar, multisequence MR imaging of the lumbar spine was performed. No intravenous contrast was administered. COMPARISON:  Prior radiograph from 11/14/2024. FINDINGS: Segmentation: Standard. Lowest well-formed disc space labeled the L5-S1 level. Alignment: Mild levoscoliosis. Alignment otherwise normal preservation of the normal lumbar lordosis. No listhesis. Vertebrae: Vertebral body height maintained without acute or chronic fracture. Decreased T1 signal intensity noted throughout the visualized bone marrow, nonspecific, but most commonly related to anemia, smoking or obesity. No worrisome osseous lesions. Mild reactive marrow edema present about the right L4-5 facet due to facet arthritis. Conus medullaris and cauda equina: Conus extends to the L1 level. Conus and cauda equina appear normal. Paraspinal and other soft tissues: Unremarkable. Disc levels: T11-12: Seen only on sagittal projection. Disc desiccation with mild disc bulge and reactive endplate  spurring. No significant spinal stenosis. Foramina appear patent. T12-L1: Unremarkable. L1-2: Disc desiccation with mild diffuse disc bulge. No spinal stenosis. Foramina remain patent. L2-3: Normal interspace. Mild bilateral facet hypertrophy. No significant spinal stenosis. Foramina remain patent. L3-4: Mild intervertebral disc space narrowing with disc desiccation, but no significant disc bulge. Mild-to-moderate bilateral facet hypertrophy with trace joint effusions. No significant spinal stenosis. Foramina remain patent. L4-5: Mild intervertebral disc space narrowing without significant disc bulge. Moderate bilateral facet arthrosis with small joint effusion on the right. No significant spinal stenosis. Mild left L4 foraminal narrowing. Right neural foramen remains patent. L5-S1: Degenerative intervertebral disc space narrowing with disc desiccation and diffuse disc bulge. Reactive endplate change with marginal endplate osteophytic spurring. Mild bilateral facet hypertrophy. No significant spinal stenosis. Mild left greater than right L5 foraminal narrowing. IMPRESSION: 1. 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 No significant stenosis or  overt neural impingement. 2. Mild left L4 and bilateral L5 foraminal narrowing related to disc bulge and facet hypertrophy. 3. Mild to moderate bilateral facet hypertrophy at L2-3 through L5-S1, most pronounced at L4-5 on the right where there is associated reactive marrow edema. Finding could contribute to lower back pain. Electronically Signed   By: Morene Hoard M.D.   On: 12/04/2024 05:12    Labs:  CBC: Recent Labs    12/16/24 0012 12/16/24 0553 12/17/24 0453 12/18/24 0005  WBC 11.9* 9.5 11.7* 15.2*  HGB 9.9* 9.0* 9.6* 7.5*  HCT 29.3* 26.7* 29.6* 23.0*  PLT 343 311 349 406*    COAGS: No results for input(s): INR, APTT in the last 8760 hours.  BMP: Recent Labs    12/15/24 0647 12/16/24 0642 12/17/24 0453  12/18/24 0005  NA 139 138 141 140  K 3.4* 3.3* 3.1* 3.5  CL 105 101 104 105  CO2 22 24 26 25   GLUCOSE 114* 121* 100* 143*  BUN 6 6 6 7   CALCIUM  8.2* 8.4* 8.1* 8.2*  CREATININE 0.60 0.69 0.58 0.58  GFRNONAA >60 >60 >60 >60    LIVER FUNCTION TESTS: Recent Labs    08/11/24 1418 12/13/24 1615 12/14/24 1457 12/15/24 0647  BILITOT 0.2 0.4 0.5 0.3  AST 14 19 17 16   ALT 11 33 22 19  ALKPHOS 55 139* 114 106  PROT 7.0 7.4 6.2* 5.9*  ALBUMIN 4.4 3.8 3.0* 2.9*    TUMOR MARKERS: No results for input(s): AFPTM, CEA, CA199, CHROMGRNA in the last 8760 hours.  Assessment and Plan:  Patient approved for image guided asp and drain placement for TOA in CT today. Case delayed from yesterday for patient needing to briefly return home to care for her animals (see previous nursing notes).  No contraindications for procedure identified in ROS, physical exam, or review of pre-sedation considerations. Labs reviewed and within acceptable range. INR pending. WBC elevated 15.2 today<11.7 1/3 MR imaging available and reviewed VSS, afebrile Patient does not take a blood thinner Abx managed by IP team, plans to obtain culture from fluid collected today   Risks and benefits discussed with the patient including bleeding, infection, damage to adjacent structures, bowel perforation/fistula connection, and sepsis.  All of the patient's questions were answered, patient is agreeable to proceed. Consent signed and in chart.   Thank you for allowing our service to participate in EDMUND RICK 's care.    Electronically Signed: Laymon Coast, NP   12/18/2024, 10:00 AM     I spent a total of 20 Minutes    in face to face in clinical consultation, greater than 50% of which was counseling/coordinating care for TOA asp and poss drain.    (A copy of this note was sent to the referring provider and the time of visit.)  "

## 2024-12-18 NOTE — Discharge Instructions (Signed)
 Interventional Radiology Percutaneous Abscess Drain Placement After Care   This sheet gives you information about how to care for yourself after your procedure. Your health care provider may also give you more specific instructions. Your drain was placed by an interventional radiologist with [facility]. If you have questions or concerns, contact [facility] at [phone].   What is a percutaneous drain?   A drain is a small plastic tube (catheter) that goes into the fluid collection in your body through your skin.   How long will I need the drain?   How long the drain needs to stay in is determined by where the drain is, how much comes out of the drain each day and if you are having any other surgical procedures.   Interventional radiology will determine when it is time to remove the drain. It is important to follow up as directed so that the drain can be removed as soon as it is safe to do so.   What can I expect after the procedure?   After the procedure, it is common to have:   A small amount of bruising and discomfort in the area where the drainage tube (catheter) was placed.   Sleepiness and fatigue. This should go away after the medicines you were given have worn off.   Follow these instructions at home:   Insertion site care   Check your insertion site when you change the bandage. Check for:   More redness, swelling, or pain.   More fluid or blood.   Warmth.   Pus or a bad smell.   When caring for your insertion site:   Wash your hands with soap and water for at least 20 seconds before and after you change your bandage (dressing). If soap and water are not available, use hand sanitizer.   You do not need to change your dressing everyday if it is clean and dry. Change your dressing every 3 days or as needed when it is soiled, wet or becoming dislodged. You will need to change your dressing each time you shower.   Leave stitches (sutures), skin  glue, or adhesive strips in place. These skin closures may need to stay in place for 2 weeks or longer. If adhesive strip edges start to loosen and curl up, you may trim the loose edges. Do not remove adhesive strips completely unless your health care provider tells you to do so.   Catheter care   Flush the catheter once per day with 5 mL of 0.9% normal saline unless you are told otherwise by your healthcare provider. This helps to prevent clogs in the catheter.   To disconnect the drain, turn the clear plastic tube to the left. Attach the saline syringe by placing it on the white end of the drain and turning gently to the right. Once attached gently push the plunger to the 5 mL mark. After you are done flushing, disconnect the syringe by turning to the left and reattach your drainage container   If you have a bulb please be sure the bulb is charged after reconnecting it - to do this pinch the bulb between your thumb and first finger and close the stopper located on the top of the bulb.    Check for fluid leaking from around your catheter (instead of fluid draining through your catheter). This may be a sign that the drain is no longer working correctly.   Write down the following information every time you empty your  bag:   The date and time.   The amount of drainage.   Activity   Rest at home for 1-2 days after your procedure.   For the first 48 hours do not lift anything more than 10 lbs (about a gallon of milk). You may perform moderate activities/exercise. Please avoid strenuous activities during this time.   Avoid any activities which may pull on your drain as this can cause your drain to become dislodged.   If you were given a sedative during the procedure, it can affect you for several hours. Do not drive or operate machinery until your health care provider says that it is safe.   General instructions   For mild pain take over-the-counter medications as needed for pain such as  Tylenol  or Advil . If you are experiencing severe pain please call our office as this may indicate an issue with your drain.    If you were prescribed an antibiotic medicine, take it as told by your health care provider. Do not stop using the antibiotic even if you start to feel better.   You may shower 24 hours after the drain is placed. To do this cover the insertion site with a water tight material such as saran wrap and seal the edges with tape, you may also purchase waterproof dressings at your local drug store. Shower as usual and then remove the water tight dressing and any gauze/tape underneath it once you have exited the shower and dried off. Allow the area to air dry or pat dry with a clean towel. Once the skin is completely dry place a new gauze dressing. It is important to keep the site dry at all times to prevent infection.   Do not submerge the drain - this means you cannot take baths, swim, use a hot tub, etc. until the drain is removed.    Do not use any products that contain nicotine or tobacco, such as cigarettes, e-cigarettes, and chewing tobacco. If you need help quitting, ask your health care provider.   Keep all follow-up visits as told by your health care provider. This is important.   Contact a health care provider if:   You have less than 10 mL of drainage a day for 2-3 days in a row, or as directed by your health care provider.   You have any of these signs of infection:   More redness, swelling, or pain around your incision area.   More fluid or blood coming from your incision area.   Warmth coming from your incision area.   Pus or a bad smell coming from your incision area.   You have fluid leaking from around your catheter (instead of through your catheter).   You are unable to flush the drain.   You have a fever or chills.   You have pain that does not get better with medicine.   You have not been contacted to schedule a drain follow up appointment  within 10 days of discharge from the hospital.   Please call [facility] at [phone] with any questions or concerns.   Get help right away if:   Your catheter comes out.   You suddenly stop having drainage from your catheter.   You suddenly have blood in the fluid that is draining from your catheter.   You become dizzy or you faint.   You develop a rash.   You have nausea or vomiting.   You have difficulty breathing or you feel short  of breath.   You develop chest pain.   You have problems with your speech or vision.   You have trouble balancing or moving your arms or legs.   Summary   It is common to have a small amount of bruising and discomfort in the area where the drainage tube (catheter) was placed. You may also have minor discomfort with movement while the drain is in place.   Flush the drain once per day with 5 mL of 0.9% normal saline (unless you were told otherwise by your healthcare provider).    Record the amount of drainage from the bag every time you empty it.   Change the dressing every 3 days or earlier if soiled/wet. Keep the skin dry under the dressing.   You may shower with the drain in place. Do not submerge the drain (no baths, swimming, hot tubs, etc.).   Contact [facility] at [phone] if you have more redness, swelling, or pain around your incision area or if you have pain that does not get better with medicine.   This information is not intended to replace advice given to you by your health care provider. Make sure you discuss any questions you have with your health care provider.   Document Revised: 03/06/2022 Document Reviewed: 11/26/2019   Elsevier Patient Education  2023 [employer]         Interventional Radiology Drain Record   Empty your drain at least once per day. You may empty it as often as needed. Use this form to write down the amount of fluid that has collected in the drainage container. Bring  this form with you to your follow-up visits. Please call [facility] at [phone] with any questions or concerns prior to your appointment.   Drain #1 location: ___________________   Date __________ Time __________ Amount __________   Date __________ Time __________ Amount __________   Date __________ Time __________ Amount __________   Date __________ Time __________ Amount __________   Date __________ Time __________ Amount __________   Date __________ Time __________ Amount __________   Date __________ Time __________ Amount __________   Date __________ Time __________ Amount __________   Date __________ Time __________ Amount __________   Date __________ Time __________ Amount __________   Date __________ Time __________ Amount __________   Date __________ Time __________ Amount __________   Date __________ Time __________ Amount __________   Date __________ Time __________ Amount __________

## 2024-12-19 ENCOUNTER — Other Ambulatory Visit: Payer: Self-pay

## 2024-12-19 ENCOUNTER — Encounter (HOSPITAL_COMMUNITY): Payer: Self-pay | Admitting: Obstetrics & Gynecology

## 2024-12-19 ENCOUNTER — Other Ambulatory Visit (HOSPITAL_COMMUNITY): Payer: Self-pay | Admitting: Radiology

## 2024-12-19 ENCOUNTER — Ambulatory Visit

## 2024-12-19 DIAGNOSIS — D500 Iron deficiency anemia secondary to blood loss (chronic): Secondary | ICD-10-CM | POA: Diagnosis not present

## 2024-12-19 DIAGNOSIS — N7093 Salpingitis and oophoritis, unspecified: Secondary | ICD-10-CM | POA: Diagnosis not present

## 2024-12-19 DIAGNOSIS — E876 Hypokalemia: Secondary | ICD-10-CM | POA: Diagnosis not present

## 2024-12-19 DIAGNOSIS — D259 Leiomyoma of uterus, unspecified: Secondary | ICD-10-CM | POA: Diagnosis not present

## 2024-12-19 LAB — BASIC METABOLIC PANEL WITH GFR
Anion gap: 11 (ref 5–15)
BUN: 6 mg/dL (ref 6–20)
CO2: 24 mmol/L (ref 22–32)
Calcium: 8.2 mg/dL — ABNORMAL LOW (ref 8.9–10.3)
Chloride: 103 mmol/L (ref 98–111)
Creatinine, Ser: 0.54 mg/dL (ref 0.44–1.00)
GFR, Estimated: >60 mL/min
Glucose, Bld: 94 mg/dL (ref 70–99)
Potassium: 3.3 mmol/L — ABNORMAL LOW (ref 3.5–5.1)
Sodium: 138 mmol/L (ref 135–145)

## 2024-12-19 LAB — CULTURE, BLOOD (ROUTINE X 2)
Culture: NO GROWTH
Special Requests: ADEQUATE

## 2024-12-19 LAB — CBC
HCT: 29.6 % — ABNORMAL LOW (ref 36.0–46.0)
Hemoglobin: 9.5 g/dL — ABNORMAL LOW (ref 12.0–15.0)
MCH: 27.5 pg (ref 26.0–34.0)
MCHC: 32.1 g/dL (ref 30.0–36.0)
MCV: 85.5 fL (ref 80.0–100.0)
Platelets: 403 K/uL — ABNORMAL HIGH (ref 150–400)
RBC: 3.46 MIL/uL — ABNORMAL LOW (ref 3.87–5.11)
RDW: 13.1 % (ref 11.5–15.5)
WBC: 12.2 K/uL — ABNORMAL HIGH (ref 4.0–10.5)
nRBC: 0 % (ref 0.0–0.2)

## 2024-12-19 MED ORDER — POTASSIUM CHLORIDE 20 MEQ PO PACK
20.0000 meq | PACK | Freq: Two times a day (BID) | ORAL | Status: AC
  Administered 2024-12-19: 20 meq via ORAL
  Filled 2024-12-19 (×2): qty 1

## 2024-12-19 NOTE — Progress Notes (Signed)
 "   Referring Physician(s): Dr, Cleotilde  Supervising Physician: Hughes Simmonds  Patient Status:  [facility] - In-pt  Chief Complaint:  Left tuboovarian abscess s/p drain placement on 1.4.26  Subjective:  Patient laying in bed. States that she is feeling better. Anticipates discharge possibly as soon as 1.6.25  Allergies: Patient has no known allergies.  Medications: Prior to Admission medications  Medication Sig Start Date End Date Taking? Authorizing Provider  Blood Pressure Monitoring (OMRON 3 SERIES BP MONITOR) DEVI Use daily as directed 08/11/24   Oley Bascom RAMAN, NP  cyanocobalamin  (VITAMIN B12) 1000 MCG tablet Take 1 tablet (1,000 mcg total) by mouth daily. 11/06/23   Oley Bascom RAMAN, NP  cyclobenzaprine  (FLEXERIL ) 10 MG tablet Take 1 tablet (10 mg total) by mouth 3 (three) times daily as needed for muscle spasms. 10/13/24   Oley Bascom RAMAN, NP  ibuprofen  (ADVIL ) 200 MG tablet Take 200 mg by mouth every 6 (six) hours as needed.    [provider]  Iron , Ferrous Sulfate , 325 (65 Fe) MG TABS Take 325 mg by mouth daily. 11/06/23   Oley Bascom RAMAN, NP  LUPRON  DEPOT, 64-MONTH, 3.75 MG injection Inject into the muscle. 11/16/24   [provider]  norethindrone  (MICRONOR ) 0.35 MG tablet Take 1 tablet (0.35 mg total) by mouth daily. 04/04/24   Lo, Arland POUR, CNM  sodium chloride  flush 0.9 % SOLN injection 5 mLs by Intracatheter route daily. 12/18/24   Huneycutt, Brittany, NP  Vitamin D , Ergocalciferol , (DRISDOL ) 1.25 MG (50000 UNIT) CAPS capsule Take 1 capsule (50,000 Units total) by mouth every 7 (seven) days. 06/29/24   Cleotilde Ronal RAMAN, MD     Vital Signs: BP 116/68 (BP Location: Right Arm)   Pulse 98   Temp 100 F (37.8 C) (Oral)   Resp 18   LMP 11/25/2024 (Approximate)   SpO2 95%   Physical Exam Vitals and nursing note reviewed.  Constitutional:      Appearance: She is well-developed. She is obese.  HENT:     Head: Normocephalic and atraumatic.  Eyes:      Conjunctiva/sclera: Conjunctivae normal.  Pulmonary:     Effort: Pulmonary effort is normal.  Abdominal:     Comments: Positive LLQ  drain to  suction. Site is unremarkable with no erythema, edema, tenderness, bleeding or drainage noted at exit site. Suture  in place. Dressing is clean dry and intact. 10 ml of  serosangious colored fluid noted in  bulb suction device.  Drain is able to be flushed easily.  No leakage or pain with flushing.  Insertion site clean and dry.     Musculoskeletal:        General: Normal range of motion.     Cervical back: Normal range of motion.  Skin:    General: Skin is warm and dry.  Neurological:     General: No focal deficit present.     Mental Status: She is alert and oriented to person, place, and time. Mental status is at baseline.  Psychiatric:        Mood and Affect: Mood normal.        Behavior: Behavior normal.        Thought Content: Thought content normal.        Judgment: Judgment normal.     Imaging: MR PELVIS W WO CONTRAST Result Date: 12/17/2024 EXAM: MR PELVIS WITH AND WITHOUT INTRAVENOUS CONTRAST 12/16/2024 05:07:49 PM TECHNIQUE: Multiplanar multisequence MRI of the pelvis was performed with and without intravenous contrast.  CONTRAST: 9 mL of Gadavist . COMPARISON: None available. CLINICAL HISTORY: Pelvic pain. FINDINGS: UTERUS: The uterus is enlarged and fibroid, measuring 13.1 x 9.4 x 11.7 cm. Multiple uterine fibroids are identified including subserosal, intramural and submucosal in location. Arising off the left posterior uterine fundus has a dominant fibroid measuring 5.8 cm. This is mildly T1 hyperintense, T2 isointense with diffuse internal enhancement. Adjacent exophytic subserosal fibroid with internal enhancement measures 8.5 cm (image 11.5). A right-sided submucosal fibroid measures 0.1 cm (image 18/6). Within the endometrium there is a focal lesion identified which shows diffuse enhancement measuring 2.3 x 3.5 cm (image 21/6). This is  indeterminate for submucosal fibroid versus polyp versus endometrial carcinoma. The endometrial stripe measures up to 7 mm in thickness. An exophytic subserosal fibroid arising off the left anterior uterine corpus shows heterogeneous enhancement measuring 4.5 cm (image 23/5). An exophytic subserosal fibroid arising off the left lateral side of the uterus measures 3.3 cm and also shows enhancement following contrast administration. The junctional zone is within normal limits. VAGINA: The vaginal canal is unremarkable. RIGHT OVARY: A fluid-filled tubular structure is identified traversing the pelvis possibly arising from the right adnexa and extending into the left adnexa. LEFT OVARY: Within the left adnexa there is a large complex thick-walled septated structure with peripheral enhancement measuring 9.1 x 7.2 x 10.2 cm. Some peripheral increased T1 signal is noted within the structure, showing some hemorrhagic component. There is diffuse increased edema and trace fluid noted within the left adnexa surrounding the mass. Differential considerations for this mass include a complex ovarian cyst (e.g., hemorrhagic cyst, endometrioma), ovarian neoplasm (benign or malignant), or a tubo-ovarian abscess. FREE FLUID: Trace fluid noted within the left adnexa surrounding the mass. BLADDER AND URETHRA: Unremarkable. VISUALIZED GASTROINTESTINAL TRACT: Unremarkable. LYMPH NODES: Prominent bilateral iliac lymph nodes measuring up to 1.1 cm (image 73/103). VASCULATURE: Unremarkable. BONES: No marrow signal abnormality. IMPRESSION: 1. Large complex thick-walled septated left adnexal mass with associated tubular right-to-left adnexal structure, most suggestive of tubo-ovarian abscess/pyosalpinx; differential includes endometrioma and ovarian neoplasm, and gynecology consultation is recommended with short-interval follow-up pelvic ultrasound or MRI in 6-8 weeks (or sooner if symptoms worsen) to document resolution. 2. Prominent  bilateral iliac lymph nodes up to 1.1 cm, likely reactive in the setting of tubovarian abscess 3. Enhancing endometrial lesion, indeterminate for polyp, submucosal fibroid, or endometrial carcinoma, and recommend hysteroscopy with tissue sampling. 4. Enlarged fibroid uterus. Electronically signed by: Waddell Calk MD 12/17/2024 08:05 AM EST RP Workstation: [id number]   CT Angio Chest Pulmonary Embolism (PE) W or WO Contrast Result Date: 12/16/2024 CLINICAL DATA:  Positive D-dimer EXAM: CT ANGIOGRAPHY CHEST WITH CONTRAST TECHNIQUE: Multidetector CT imaging of the chest was performed using the standard protocol during bolus administration of intravenous contrast. Multiplanar CT image reconstructions and MIPs were obtained to evaluate the vascular anatomy. RADIATION DOSE REDUCTION: This exam was performed according to the departmental dose-optimization program which includes automated exposure control, adjustment of the mA and/or kV according to patient size and/or use of iterative reconstruction technique. CONTRAST:  75mL OMNIPAQUE  IOHEXOL  350 MG/ML SOLN COMPARISON:  Yesterday FINDINGS: Cardiovascular: Satisfactory opacification of the pulmonary arteries to the segmental level. No evidence of pulmonary embolism. Normal heart size. No pericardial effusion. Mediastinum/Nodes: No enlarged mediastinal, hilar, or axillary lymph nodes. Thyroid  gland, trachea, and esophagus demonstrate no significant findings. Lungs/Pleura: No pneumothorax or pleural effusion is noted. Minimal bibasilar subsegmental atelectasis is noted. Upper Abdomen: No acute abnormality. Musculoskeletal: No chest wall abnormality. No acute or significant osseous findings.  Review of the MIP images confirms the above findings. IMPRESSION: No definite evidence of pulmonary embolus. Minimal bibasilar subsegmental atelectasis. Electronically Signed   By: Lynwood Landy Raddle M.D.   On: 12/16/2024 14:30   DG CHEST PORT 1 VIEW Result Date: 12/15/2024 EXAM: 1  VIEW(S) XRAY OF THE CHEST 12/15/2024 11:00:00 PM COMPARISON: None available. CLINICAL HISTORY: Chest pain [phone]; Oxygen desaturation 242050 FINDINGS: LUNGS AND PLEURA: Low lung volumes. No focal pulmonary opacity. No pleural effusion. No pneumothorax. HEART AND MEDIASTINUM: Metallic density projecting over thoracic inlet, presumably external to patient. No acute abnormality of the cardiac and mediastinal silhouettes. BONES AND SOFT TISSUES: No acute osseous abnormality. IMPRESSION: 1. No acute cardiopulmonary abnormality. 2. Low lung volumes. 3. Metallic density overlying the thoracic inlet, favored external to the patient. Electronically signed by: Greig Pique MD 12/15/2024 11:34 PM EST RP Workstation: [id number]    Labs:  CBC: Recent Labs    12/16/24 0553 12/17/24 0453 12/18/24 0005 12/19/24 0413  WBC 9.5 11.7* 15.2* 12.2*  HGB 9.0* 9.6* 7.5* 9.5*  HCT 26.7* 29.6* 23.0* 29.6*  PLT 311 349 406* 403*    COAGS: Recent Labs    12/18/24 1019  INR 1.3*    BMP: Recent Labs    12/16/24 0642 12/17/24 0453 12/18/24 0005 12/19/24 0413  NA 138 141 140 138  K 3.3* 3.1* 3.5 3.3*  CL 101 104 105 103  CO2 24 26 25 24   GLUCOSE 121* 100* 143* 94  BUN 6 6 7 6   CALCIUM  8.4* 8.1* 8.2* 8.2*  CREATININE 0.69 0.58 0.58 0.54  GFRNONAA >60 >60 >60 >60    LIVER FUNCTION TESTS: Recent Labs    08/11/24 1418 12/13/24 1615 12/14/24 1457 12/15/24 0647  BILITOT 0.2 0.4 0.5 0.3  AST 14 19 17 16   ALT 11 33 22 19  ALKPHOS 55 139* 114 106  PROT 7.0 7.4 6.2* 5.9*  ALBUMIN 4.4 3.8 3.0* 2.9*    Assessment and Plan:  44 y.o. female inpatient. History of anemia, prediabetes, long Covid, uterine fibroid. Presented to the ED [facility] on 12.30.25 with left sided abdominal pain and fever. Found to have leukocytosis and left adnexal mass/ TOA. IR placed a LLQ drain on 1.4.26. with aspiration of 10 ml. Cultures show no growth n 24 hours.   Drain Location: LLQ Size: Fr size: 10 Fr Date of placement:  1.4.25  Currently to: Drain collection device: suction bulb 24 hour output:  Output by Drain (mL) 12/17/24 0700 - 12/17/24 1459 12/17/24 1500 - 12/17/24 2259 12/17/24 2300 - 12/18/24 0659 12/18/24 0700 - 12/18/24 1459 12/18/24 1500 - 12/18/24 2259 12/18/24 2300 - 12/19/24 0659 12/19/24 0700 - 12/19/24 0848  Closed System Drain 1 Left;Anterior Abdomen Bulb (JP)    90 60 20 10  WBC is 12.2 down-trending.  Interval imaging/drain manipulation:  None since drain placement  Current examination: Flushes/aspirates easily.  Insertion site unremarkable. Suture and stat lock in place. Dressed appropriately.   Plan: Continue TID flushes with 5 cc NS. Record output Q shift. Dressing changes QD or PRN if soiled.  Call IR APP or on call IR MD if difficulty flushing or sudden change in drain output.  Repeat imaging/possible drain injection once output < 10 mL/QD (excluding flush material). Consideration for drain removal if output is < 10 mL/QD (excluding flush material), pending discussion with the providing surgical service.  Discharge planning: Appropriate follow up plans are in place. Typically patient will follow up with IR clinic 10-14 days post d/c  for repeat imaging/possible drain injection. IR scheduler will contact patient with date/time of appointment. Patient will need to flush drain QD with 5 cc NS, record output QD, dressing changes every 2-3 days or earlier if soiled.   IR will continue to follow - please call with questions or concerns.   Electronically Signed: Delon JAYSON Beagle, NP 12/19/2024, 7:47 AM   I spent a total of 15 Minutes at the patient's bedside AND on the patient's hospital floor or unit, greater than 50% of which was counseling/coordinating care for left TOA abscess drain.       "

## 2024-12-19 NOTE — Plan of Care (Signed)
" °  Problem: Education: Goal: Knowledge of General Education information will improve Description: Including pain rating scale, medication(s)/side effects and non-pharmacologic comfort measures Outcome: Progressing   Problem: Health Behavior/Discharge Planning: Goal: Ability to manage health-related needs will improve Outcome: Progressing   Problem: Clinical Measurements: Goal: Ability to maintain clinical measurements within normal limits will improve Outcome: Progressing   Problem: Clinical Measurements: Goal: Will remain free from infection Outcome: Progressing   Problem: Clinical Measurements: Goal: Diagnostic test results will improve Outcome: Progressing   Problem: Clinical Measurements: Goal: Respiratory complications will improve Outcome: Progressing   Problem: Nutrition: Goal: Adequate nutrition will be maintained Outcome: Progressing   Problem: Coping: Goal: Level of anxiety will decrease Outcome: Progressing   "

## 2024-12-19 NOTE — Progress Notes (Signed)
 Subjective: Patient reports improved pain.  Has loose stool yesterday.  Voiding well.  No issues with drain.  Has been receiving some teaching about flushing.  Has temp of 100.0 this morning at 6:45am.  Felt a little sweaty then but no chills.  WBC ct 12.2 today.  Improved from 15.6.  Drain placement was around 3pm yesterday.    Objective: I have reviewed patient's vital signs, intake and output, medications, and labs.  Vitals:   12/19/24 0644 12/19/24 0745 12/19/24 0824 12/19/24 1216  BP:   123/72 135/86  Pulse:   84 97  Temp: 100 F (37.8 C) 98.9 F (37.2 C)  98.4 F (36.9 C)  Resp:   16 18  SpO2:   97% 95%  TempSrc: Oral Oral  Oral    Drain outpt:  ~ 270 including what was immediately drained by IR    Latest Ref Rng & Units 12/19/2024    4:13 AM 12/18/2024   12:05 AM 12/17/2024    4:53 AM  CBC  WBC 4.0 - 10.5 K/uL 12.2  15.2  11.7   Hemoglobin 12.0 - 15.0 g/dL 9.5  7.5  9.6   Hematocrit 36.0 - 46.0 % 29.6  23.0  29.6   Platelets 150 - 400 K/uL 403  406  349       Latest Ref Rng & Units 12/19/2024    4:13 AM 12/18/2024   12:05 AM 12/17/2024    4:53 AM  BMP  Glucose 70 - 99 mg/dL 94  856  899   BUN 6 - 20 mg/dL 6  7  6    Creatinine 0.44 - 1.00 mg/dL 9.45  9.41  9.41   Sodium 135 - 145 mmol/L 138  140  141   Potassium 3.5 - 5.1 mmol/L 3.3  3.5  3.1   Chloride 98 - 111 mmol/L 103  105  104   CO2 22 - 32 mmol/L 24  25  26    Calcium  8.9 - 10.3 mg/dL 8.2  8.2  8.1     General: alert and no distress Resp: clear to auscultation bilaterally Cardio: regular rate and rhythm, S1, S2 normal, no murmur, click, rub or gallop GI: soft, decreased tenderness especially in LLQ, no guarding or rebound, +BS Extremities: extremities normal, atraumatic, no cyanosis or edema Vaginal Bleeding: minimal   Assessment/Plan: 1) TOA.  S/p IR drainage.  Now with just serosangeous drainage that has decreased significantly since placement.  Continue with IV rocephin  and oral doxy and flagyl .  IR drainage  scheduled today. WBC ct was 12.2, decreased from 15.6.       2) Single blood culture.positive.  Skin contaminant.    3) hypokalemia.  Improved.  Will continue restart BID dosing   4) Anemia.  On oral iron .   5) prophylaxis.  Has SCDs and IS.   6) Anxiety.  Hydroxyzine  ordered.   7) Fibroid uterus.   LOS: 2 days    Ronal GORMAN Pinal, MD 12/19/2024, 1:16 PM

## 2024-12-20 ENCOUNTER — Inpatient Hospital Stay (HOSPITAL_COMMUNITY)

## 2024-12-20 ENCOUNTER — Other Ambulatory Visit (HOSPITAL_COMMUNITY): Payer: Self-pay

## 2024-12-20 LAB — CBC
HCT: 30.4 % — ABNORMAL LOW (ref 36.0–46.0)
HCT: 32.6 % — ABNORMAL LOW (ref 36.0–46.0)
Hemoglobin: 9.9 g/dL — ABNORMAL LOW (ref 12.0–15.0)
Hemoglobin: 10.6 g/dL — ABNORMAL LOW (ref 12.0–15.0)
MCH: 27.5 pg (ref 26.0–34.0)
MCH: 27.4 pg (ref 26.0–34.0)
MCHC: 32.6 g/dL (ref 30.0–36.0)
MCHC: 32.5 g/dL (ref 30.0–36.0)
MCV: 84.4 fL (ref 80.0–100.0)
MCV: 84.2 fL (ref 80.0–100.0)
Platelets: 414 K/uL — ABNORMAL HIGH (ref 150–400)
Platelets: 467 K/uL — ABNORMAL HIGH (ref 150–400)
RBC: 3.6 MIL/uL — ABNORMAL LOW (ref 3.87–5.11)
RBC: 3.87 MIL/uL (ref 3.87–5.11)
RDW: 13 % (ref 11.5–15.5)
RDW: 13.1 % (ref 11.5–15.5)
WBC: 11.6 K/uL — ABNORMAL HIGH (ref 4.0–10.5)
WBC: 9.8 K/uL (ref 4.0–10.5)
nRBC: 0.2 % (ref 0.0–0.2)
nRBC: 0.2 % (ref 0.0–0.2)

## 2024-12-20 LAB — BASIC METABOLIC PANEL WITH GFR
Anion gap: 10 (ref 5–15)
BUN: 6 mg/dL (ref 6–20)
CO2: 25 mmol/L (ref 22–32)
Calcium: 8.2 mg/dL — ABNORMAL LOW (ref 8.9–10.3)
Chloride: 106 mmol/L (ref 98–111)
Creatinine, Ser: 0.55 mg/dL (ref 0.44–1.00)
GFR, Estimated: >60 mL/min
Glucose, Bld: 100 mg/dL — ABNORMAL HIGH (ref 70–99)
Potassium: 3.7 mmol/L (ref 3.5–5.1)
Sodium: 141 mmol/L (ref 135–145)

## 2024-12-20 MED ORDER — ONDANSETRON HCL 4 MG PO TABS
4.0000 mg | ORAL_TABLET | Freq: Four times a day (QID) | ORAL | 0 refills | Status: AC | PRN
  Filled 2024-12-20: qty 20, 5d supply, fill #0

## 2024-12-20 MED ORDER — HYDROXYZINE HCL 10 MG PO TABS
10.0000 mg | ORAL_TABLET | Freq: Three times a day (TID) | ORAL | 0 refills | Status: DC | PRN
  Filled 2024-12-20: qty 16, 6d supply, fill #0

## 2024-12-20 MED ORDER — METRONIDAZOLE 500 MG PO TABS
500.0000 mg | ORAL_TABLET | Freq: Two times a day (BID) | ORAL | 0 refills | Status: AC
  Filled 2024-12-20: qty 21, 11d supply, fill #0

## 2024-12-20 MED ORDER — OXYCODONE HCL 10 MG PO TABS
10.0000 mg | ORAL_TABLET | ORAL | 0 refills | Status: AC | PRN
  Filled 2024-12-20: qty 30, 5d supply, fill #0

## 2024-12-20 MED ORDER — IOHEXOL 350 MG/ML SOLN
80.0000 mL | Freq: Once | INTRAVENOUS | Status: AC | PRN
  Administered 2024-12-20: 80 mL via INTRAVENOUS

## 2024-12-20 MED ORDER — DOXYCYCLINE HYCLATE 100 MG PO TABS
100.0000 mg | ORAL_TABLET | Freq: Two times a day (BID) | ORAL | 0 refills | Status: AC
  Filled 2024-12-20: qty 21, 11d supply, fill #0

## 2024-12-20 MED ORDER — DOXYCYCLINE HYCLATE 100 MG PO TABS
100.0000 mg | ORAL_TABLET | Freq: Two times a day (BID) | ORAL | 0 refills | Status: DC
  Filled 2024-12-20: qty 21, 11d supply, fill #0

## 2024-12-20 MED ORDER — METRONIDAZOLE 500 MG PO TABS
500.0000 mg | ORAL_TABLET | Freq: Two times a day (BID) | ORAL | 0 refills | Status: DC
  Filled 2024-12-20: qty 21, 11d supply, fill #0

## 2024-12-20 MED ORDER — ONDANSETRON HCL 4 MG PO TABS
4.0000 mg | ORAL_TABLET | Freq: Four times a day (QID) | ORAL | 0 refills | Status: DC | PRN
  Filled 2024-12-20: qty 20, 5d supply, fill #0

## 2024-12-20 MED ORDER — OXYCODONE HCL 10 MG PO TABS
10.0000 mg | ORAL_TABLET | ORAL | 0 refills | Status: DC | PRN
  Filled 2024-12-20: qty 30, 5d supply, fill #0

## 2024-12-20 NOTE — Progress Notes (Signed)
 SUBJECTIVE: pain much improved, tolerating po and voiding without issue. Feels ready to go home  OBJECTIVE:  Today's Vitals   12/20/24 1309 12/20/24 1409 12/20/24 1510 12/20/24 1559  BP: 128/83   124/77  Pulse: 80   91  Resp: 18   18  Temp: 98.8 F (37.1 C)   97.6 F (36.4 C)  TempSrc: Oral   Oral  SpO2: 98%   100%  PainSc:  5  8     There is no height or weight on file to calculate BMI.  NAD Normal respiratory effort Minimal abdominal tenderness  ASSESSMENT/PLAN - stable for discharge - leukocytosis resolved - IR follow up for drain - ABX to completed 14 days - follow up in [facility] on Friday 1/9  Carter Quarry, MD, FACOG Minimally Invasive Gynecologic Surgery  Obstetrics and Gynecology, [facility] for [facility], [facility] 12/20/2024

## 2024-12-20 NOTE — Progress Notes (Signed)
 JP drain flushed. New dressing applied. VEVA LITTIE POD, RN

## 2024-12-20 NOTE — Progress Notes (Signed)
 Pt's nurse, Veva Pod, communicated earlier in the day that patient really wanted to go home.  No temp in more than 24 hours and improved pain.  CT obtained today with decreased size of area where abscess was present.  Spoke personally with IR and no other area to be drained.  Stable 6cm cystic adnexal region on the left not recommended for drainage as this has been present for several years.  WBC ct repeated and was 9.8.  Pt feeling the need to be at home with animals and willing to leaver AMA.  Given decreased temp, improved pain, normal WBC ct, feel it is reasonable to discharge pt with close follow up.  Spoke personally with RN, Terri, and patient who is very comfortable with plan.

## 2024-12-21 ENCOUNTER — Telehealth (HOSPITAL_BASED_OUTPATIENT_CLINIC_OR_DEPARTMENT_OTHER): Payer: Self-pay

## 2024-12-21 ENCOUNTER — Ambulatory Visit: Admitting: Physical Therapy

## 2024-12-21 ENCOUNTER — Telehealth: Payer: Self-pay

## 2024-12-21 LAB — CULTURE, BLOOD (ROUTINE X 2): Culture: NO GROWTH

## 2024-12-21 NOTE — Telephone Encounter (Signed)
 Patient scheduled for appointment on Friday with Dr. Cleotilde. Kaitlyn RN communicated with patient via MyChart.   Kelli LOISE Quale, RN

## 2024-12-21 NOTE — Telephone Encounter (Signed)
 Patient called and stated that she was to have a follow up appointment for this Friday after a hospitalization and no one has called her. Should I put her on the schedule for Friday?

## 2024-12-21 NOTE — Transitions of Care (Post Inpatient/ED Visit) (Signed)
" ° °  12/21/2024  Name: Kelli Cooper MRN: [id number] DOB: Oct 30, 1981  Today's TOC FU Call Status: Today's TOC FU Call Status:: Unsuccessful Call (1st Attempt) Unsuccessful Call (1st Attempt) Date: 12/21/24  Attempted to reach the patient regarding the most recent Inpatient/ED visit.  Follow Up Plan: Additional outreach attempts will be made to reach the patient to complete the Transitions of Care (Post Inpatient/ED visit) call.   Arvin Seip RN, BSN, CCM [employer], Population Health Case Manager Phone: [phone]  "

## 2024-12-22 ENCOUNTER — Telehealth: Payer: Self-pay

## 2024-12-22 LAB — CULTURE, BLOOD (SINGLE): Culture: NO GROWTH

## 2024-12-22 NOTE — Progress Notes (Unsigned)
" ° °  GYNECOLOGY  VISIT  CC:   Follow-up   HPI: 44 y.o. G43P0010 Single White or Caucasian female here for hospitalization follow up from tubo-ovarian abscess.  Patient's last menstrual period was 11/25/2024 (approximate).  Past Medical History:  Diagnosis Date   Anemia    Arthralgia    COVID    Iron  deficiency anemia    Muscle pain    Palpitations    Positive ANA (antinuclear antibody)    Prediabetes     MEDS:  Reviewed in EPIC  ALLERGIES: Patient has no known allergies.  SH:  ***  ROS  PHYSICAL EXAMINATION:    BP (!) 139/93 (BP Location: Left Arm, Patient Position: Sitting, Cuff Size: Large)   Pulse (!) 120   Wt 218 lb (98.9 kg)   LMP 11/25/2024 (Approximate)   SpO2 100%   BMI 38.62 kg/m     General appearance: alert, cooperative and appears stated age Neck: no adenopathy, supple, symmetrical, trachea midline and thyroid  {CHL AMB PHY EX THYROID  NORM DEFAULT:[phone]::normal to inspection and palpation} CV:  {Exam; heart brief:31539} Lungs:  {pe lungs ob:314451} Breasts: {Exam; breast:13139::normal appearance, no masses or tenderness} Abdomen: soft, non-tender; bowel sounds normal; no masses,  no organomegaly Lymph:  no inguinal LAD noted  Pelvic: External genitalia:  no lesions              Urethra:  normal appearing urethra with no masses, tenderness or lesions              Bartholins and Skenes: normal                 Vagina: {exam; pelvic vaginal:30846}              Cervix: {CHL AMB PHY EX CERVIX NORM [phone]::no lesions}              Bimanual Exam:  Uterus:  {CHL AMB PHY EX UTERUS NORM DEFAULT:[phone]::normal size, contour, position, consistency, mobility, non-tender}              Adnexa: {CHL AMB PHY EX ADNEXA NO MASS DEFAULT:[phone]::no mass, fullness, tenderness}              Rectovaginal: {yes no:314532}.  Confirms.              Anus:  normal sphincter tone, no lesions  Chaperone was present for  exam.  Assessment/Plan: There are no diagnoses linked to this encounter.  "

## 2024-12-22 NOTE — Transitions of Care (Post Inpatient/ED Visit) (Addendum)
 "  12/22/2024  Name: Kelli Cooper MRN: [id number] DOB: 05/05/1981  Today's TOC FU Call Status: Today's TOC FU Call Status:: Successful TOC FU Call Completed TOC FU Call Complete Date: 12/22/24  Patient's Name and Date of Birth confirmed. Name, DOB  Transition Care Management Follow-up Telephone Call Date of Discharge: 12/20/24 Discharge Facility: Other [facility]) Name of Other (Non[facility]) Discharge Facility: [facility] Type of Discharge: Inpatient Admission Primary Inpatient Discharge Diagnosis:: left tuboovarian abscess How have you been since you were released from the hospital?: Better Any questions or concerns?: No  Items Reviewed: Did you receive and understand the discharge instructions provided?: Yes Medications obtained,verified, and reconciled?: Yes (Medications Reviewed) Any new allergies since your discharge?: No (patient states, med was not a real allergy but will follow up with OBGYN to confirm) Dietary orders reviewed?: NA Do you have support at home?: Yes People in Home [RPT]: alone Name of Support/Comfort Primary Source: has friends and my dog and cat  Medications Reviewed Today: Medications Reviewed Today     Reviewed by Eilleen Richerd GRADE, RN (Registered Nurse) on 12/22/24 at 1519  Med List Status: <None>   Medication Order Taking? Sig Documenting Provider Last Dose Status Informant  Blood Pressure Monitoring (OMRON 3 SERIES BP MONITOR) DEVI [id number]  Use daily as directed Oley Bascom RAMAN, NP  Active   cyanocobalamin  (VITAMIN B12) 1000 MCG tablet [id number] Yes Take 1 tablet (1,000 mcg total) by mouth daily. Oley Bascom RAMAN, NP  Active   cyclobenzaprine  (FLEXERIL ) 10 MG tablet [id number] Yes Take 1 tablet (10 mg total) by mouth 3 (three) times daily as needed for muscle spasms. Oley Bascom RAMAN, NP  Active   doxycycline  (VIBRA -TABS) 100 MG tablet [id number] Yes Take 1 tablet (100 mg total) by mouth every 12 (twelve)  hours for 11 days. Cleotilde Ronal RAMAN, MD  Active   hydrOXYzine  (ATARAX ) 10 MG tablet [id number] Yes Take 1 tablet (10 mg total) by mouth 3 (three) times daily as needed for anxiety or itching. Cleotilde Ronal RAMAN, MD  Active   ibuprofen  (ADVIL ) 200 MG tablet [id number] Yes Take 200 mg by mouth every 6 (six) hours as needed. [provider]  Active            Med Note SAMULE, [facility]   Wed Jul 29, 2023  3:20 PM) prn  Iron , Ferrous Sulfate , 325 (65 Fe) MG TABS [id number] Yes Take 325 mg by mouth daily. Oley Bascom RAMAN, NP  Active   LUPRON  DEPOT, 66-MONTH, 3.75 MG injection [id number] Yes Inject into the muscle. [provider]  Active            Med Note LESLY, RICHERD GRADE Schaumann Dec 22, 2024 12:53 PM) Follow up with OB-GYN  metroNIDAZOLE  (FLAGYL ) 500 MG tablet [id number] Yes Take 1 tablet (500 mg total) by mouth every 12 (twelve) hours for 11 days. Cleotilde Ronal RAMAN, MD  Active   norethindrone  (MICRONOR ) 0.35 MG tablet [id number] Yes Take 1 tablet (0.35 mg total) by mouth daily. Tad Arland POUR, CNM  Active   ondansetron  (ZOFRAN ) 4 MG tablet [id number] Yes Take 1 tablet (4 mg total) by mouth every 6 (six) hours as needed for nausea. Cleotilde Ronal RAMAN, MD  Active   Oxycodone  HCl 10 MG TABS [id number] Yes Take 1 tablet (10 mg total) by mouth every 4 (four) hours as needed. Cleotilde Ronal RAMAN, MD  Active   sodium chloride  flush 0.9 % SOLN injection [id number]  5  mLs by Intracatheter route daily. Huneycutt, Brittany, NP  Active   Vitamin D , Ergocalciferol , (DRISDOL ) 1.25 MG (50000 UNIT) CAPS capsule [id number]  Take 1 capsule (50,000 Units total) by mouth every 7 (seven) days.  Patient not taking: Reported on 12/22/2024   Cleotilde Ronal RAMAN, MD  Active             Home Care and Equipment/Supplies: Were Home Health Services Ordered?: NA Any new equipment or medical supplies ordered?: NA  Functional Questionnaire: Do you need assistance with bathing/showering or dressing?: No Do you need assistance with meal  preparation?: No Do you need assistance with eating?: No Do you need assistance with getting out of bed/getting out of a chair/moving?: No (uses cane - chronic leg issues post COVID) Do you have difficulty managing or taking your medications?: No  Follow up appointments reviewed: PCP Follow-up appointment confirmed?: Yes (Assisted with BOOKIT appointment with PCP) Date of PCP follow-up appointment?: 02/02/25 Follow-up Provider: Oley Bascom RAMAN, NP Specialist Hospital Follow-up appointment confirmed?: Yes Date of Specialist follow-up appointment?: 12/23/24 Follow-Up Specialty Provider:: Ronal Cleotilde, MD OBGYN Do you need transportation to your follow-up appointment?: No Do you understand care options if your condition(s) worsen?: Yes-patient verbalized understanding  SDOH Interventions Today    Flowsheet Row Most Recent Value  SDOH Interventions   Food Insecurity Interventions Intervention Not Indicated, Other (Comment)  [Patient has good friends]  Housing Interventions Intervention Not Indicated  [Covered housing and utilities with good situation afforded]  Transportation Interventions Intervention Not Indicated  [Transportation - [place]]  Utilities Interventions Intervention Not Indicated  *SDOH reviewed and patient states she is in a good space and grateful to programs and friends that she currently has no need   Goals Addressed             This Visit's Progress    VBCI Transitions of Care (TOC) Care Plan       Problems: Hospitalized at [facility] DC 12/21/24 - review Recent Hospitalization for treatment of Tubo-ovarian abscess s/p post op with drain  No Hospital Follow Up Provider appointment Was able to assist patient in making a BookIT appointment made with PCP , SDOH barrier: addressed patient states no current needs , and post op care review needs  Goal:  Over the next 30 days, the patient will not experience hospital readmission  Interventions:   Transitions of Care: Doctor Visits  - discussed the importance of doctor visits  Surgery (Tubo-ovarian abscess s/p surgical procedure): Evaluation of current treatment plan related to Tubo-ovarian abscess  surgery assessed patient/caregiver understanding of surgical procedure   reviewed post-operative instructions with patient/caregiver addressed questions about post - surgical incision care  reviewed medications with patient and addressed questions reviewed scheduled provider appointments with patientand assisted to get PCP appointment set up confirmed availability of transportation to all appointments patient has made arrangements through [facility] transportation arranged performed EYV7-EYV0 assessment     Patient Self Care Activities:  Attend all scheduled provider appointments Call pharmacy for medication refills 3-7 days in advance of running out of medications Call provider office for new concerns or questions  Notify RN Care Manager of TOC call rescheduling needs Participate in Transition of Care Program/Attend TOC scheduled calls Take medications as prescribed    Plan:  The patient has been provided with contact information for the care management team and has been advised to call with any health related questions or concerns.  12/22/24 Discussed and offered 30 day TOC program.  Patient  agrees to weekly calls in program.  The patient has been provided with contact information for the care management team and has been advised to call with any health -related questions or concerns.  The patient verbalized understanding with current plan of care.  The patient is directed to their insurance card regarding availability of benefits coverage for transportation - patient is active in setting up for needs.          Richerd Fish, RN, BSN, CCM [facility] Management Coordinator Direct Dial: [phone]        "

## 2024-12-22 NOTE — Patient Instructions (Addendum)
 Visit Information  Thank you for taking time to visit with me today. Please don't hesitate to contact me if I can be of assistance to you before our next scheduled telephone appointment.  Our next appointment is by telephone on 12/29/24 at 1345  Following is a copy of your care plan:   Goals Addressed             This Visit's Progress    VBCI Transitions of Care (TOC) Care Plan       Problems: Hospitalized at [facility] DC 12/21/24 - review Recent Hospitalization for treatment of Tubo-ovarian abscess s/p post op with drain  No Hospital Follow Up Provider appointment Was able to assist patient in making a BookIT appointment made with PCP , SDOH barrier: addressed patient states no current needs , and post op care review needs  Goal:  Over the next 30 days, the patient will not experience hospital readmission  Interventions:  Transitions of Care: Doctor Visits  - discussed the importance of doctor visits  Surgery (Tubo-ovarian abscess s/p surgical procedure): Evaluation of current treatment plan related to Tubo-ovarian abscess  surgery assessed patient/caregiver understanding of surgical procedure   reviewed post-operative instructions with patient/caregiver addressed questions about post - surgical incision care  reviewed medications with patient and addressed questions reviewed scheduled provider appointments with patientand assisted to get PCP appointment set up confirmed availability of transportation to all appointments patient has made arrangements through [facility] transportation arranged performed EYV7-EYV0 assessment     Patient Self Care Activities:  Attend all scheduled provider appointments Call pharmacy for medication refills 3-7 days in advance of running out of medications Call provider office for new concerns or questions  Notify RN Care Manager of TOC call rescheduling needs Participate in Transition of Care Program/Attend TOC scheduled  calls Take medications as prescribed    Plan:  The patient has been provided with contact information for the care management team and has been advised to call with any health related questions or concerns.  12/22/24 Discussed and offered 30 day TOC program.  Patient agrees to weekly calls in program.  The patient has been provided with contact information for the care management team and has been advised to call with any health -related questions or concerns.  The patient verbalized understanding with current plan of care.  The patient is directed to their insurance card regarding availability of benefits coverage for transportation - patient is active in setting up for needs.          Patient verbalizes understanding of instructions and care plan provided today and agrees to view in MyChart. Active MyChart status and patient understanding of how to access instructions and care plan via MyChart confirmed with patient.     The patient has been provided with contact information for the care management team and has been advised to call with any health related questions or concerns.   Please call the care guide team at [phone] if you need to cancel or reschedule your appointment.   Please call the Suicide and Crisis Lifeline: 988 call the [place]  National Suicide Prevention Lifeline: (931)049-3200 or TTY: (605)194-7666 TTY 419-168-0121) to talk to a trained counselor call 1-800-273-TALK (toll free, 24 hour hotline) go to [facility] [address], [place] [phone]) call 911 if you are experiencing a Mental Health or Behavioral Health Crisis or need someone to talk to.  Richerd Fish, RN, BSN, CCM [facility],  Population Health Care Management Coordinator Direct Dial: [phone]

## 2024-12-23 ENCOUNTER — Other Ambulatory Visit: Payer: Self-pay | Admitting: Obstetrics and Gynecology

## 2024-12-23 ENCOUNTER — Ambulatory Visit (INDEPENDENT_AMBULATORY_CARE_PROVIDER_SITE_OTHER): Payer: Self-pay | Admitting: Obstetrics & Gynecology

## 2024-12-23 ENCOUNTER — Encounter (HOSPITAL_BASED_OUTPATIENT_CLINIC_OR_DEPARTMENT_OTHER): Payer: Self-pay | Admitting: Obstetrics & Gynecology

## 2024-12-23 ENCOUNTER — Ambulatory Visit (HOSPITAL_BASED_OUTPATIENT_CLINIC_OR_DEPARTMENT_OTHER): Payer: Self-pay | Admitting: Obstetrics & Gynecology

## 2024-12-23 VITALS — BP 139/93 | HR 120 | Wt 218.0 lb

## 2024-12-23 DIAGNOSIS — N7093 Salpingitis and oophoritis, unspecified: Secondary | ICD-10-CM

## 2024-12-23 DIAGNOSIS — D500 Iron deficiency anemia secondary to blood loss (chronic): Secondary | ICD-10-CM

## 2024-12-23 DIAGNOSIS — D252 Subserosal leiomyoma of uterus: Secondary | ICD-10-CM

## 2024-12-23 DIAGNOSIS — D250 Submucous leiomyoma of uterus: Secondary | ICD-10-CM | POA: Diagnosis not present

## 2024-12-23 LAB — AEROBIC/ANAEROBIC CULTURE W GRAM STAIN (SURGICAL/DEEP WOUND): Culture: NO GROWTH

## 2024-12-26 ENCOUNTER — Ambulatory Visit

## 2024-12-26 NOTE — Discharge Summary (Signed)
 Patient ID: Kelli Cooper [id number]  Admit date: 12/13/2024  7:47 PM  Discharge date: 12/17/2024  2:28 PM  Admission Diagnoses: Pelvic pain [R10.20] Left lower quadrant abdominal pain [R10.32] Sepsis, due to unspecified organism, unspecified whether acute organ dysfunction present [facility]) [A41.9] PID (acute pelvic inflammatory disease) [N73.0]  Discharge Diagnoses:  Left TOA No sepsis disagnosis LLQ pain Fibroid uterus Iron  deficiency anemia Irregular bleeding  Discharged Condition: stable  Hospital Course: Pt presented to ER at [facility] on 12/30 with LLQ pain, fever and chills.  WBC ct elevated to 14.5.  Imaging with CT and u/s showed enlarged uterus measuring about 16 x 8 x 10cm with multiple fiborids, largest 7.3cm and second 6.2cm.  Left adnexal complex cystic mass measured 11 x 9 x 55m.  7.67m cystic structure within this complex that had been previously present.  Pt was tender on exam with last episode of intercourse 6 weeks prior.  Pt admitted on 12/31 for IV antibiotics and treated for PID.  Pt seen and examined each day of hospitalization.  Pain did improve moderately but she continued to have fevers spiking to highest of 102.  WBC ct improved but hovered around 11K.  In morning of 1/2, around the time of an increased temp, she had an episode of SOB.  Pulse ox was mildly decreased.  CT of chest to assess for PE was obtained and was negative.  D-dimer was elevated c/w active infection.  At time point, pt had been on antibiotics for a full 48 hours so pelvic MRI ordered.  TOA presence noted.  IR consulted.  Plan for IR drainage on 1/3 determined.  In AM of 1/3, pt was very anxious to leave hospital to go and check on her dog and cat.  A friend caring for them let her dog out accidentally and the dog was missing.  Pt quite anxious.  Despite having IV in place (and pt having difficult IV placement) and on the schedule for IR drainage, pt ultimately felt she needed to leave to check on  animals.  Counseled about AMA form.  Pt was  aware WBC ct wasn't normal still and she was still having low grade temp.  Pt signed AMA form, IV was removed and pt left hospital.    Consults: Interventional radiology  Significant Diagnostic Studies: CT check/abdomen, CXR, CT chest, MRI pelvis  Treatments: antibiotics: ceftriaxone , metronidazole , and Doxycycline  and IR drainage of TOA  Discharge Exam: See last exam in POD#1 note.  Disposition: Home or Self Care  Signed: CLEOTILDE CHRISTELLA AREA 07/02/2011, 6:26 PM

## 2024-12-28 ENCOUNTER — Ambulatory Visit: Attending: Nurse Practitioner | Admitting: Physical Therapy

## 2024-12-28 VITALS — BP 114/71 | HR 85

## 2024-12-28 DIAGNOSIS — M542 Cervicalgia: Secondary | ICD-10-CM | POA: Insufficient documentation

## 2024-12-28 DIAGNOSIS — M6281 Muscle weakness (generalized): Secondary | ICD-10-CM | POA: Insufficient documentation

## 2024-12-28 DIAGNOSIS — R2689 Other abnormalities of gait and mobility: Secondary | ICD-10-CM | POA: Insufficient documentation

## 2024-12-28 DIAGNOSIS — R5381 Other malaise: Secondary | ICD-10-CM | POA: Insufficient documentation

## 2024-12-28 NOTE — Discharge Summary (Signed)
 Patient ID: RANI IDLER [id number]  Admit date: 12/17/2024  2:44 PM  Discharge date: 12/20/2024  6:40 PM  Admission Diagnoses: Tubo-ovarian abscess [N70.93] Fibroid uterus Elevated WBC count Hypokalemia  Discharge Diagnoses:  Same as Admission Diagnosis  Discharged Condition: good  Hospital Course: Patient was readmitted on 12/17/2024 after leaving AMA earlier in the day for need to check on her animals.  She was scheduled for IR drainage of the abscess on the same day but was not in the hospital when her time on the schedule occurred so she was rescheduled for the next day, 12/18/2024.  Pt's white blood cell count remained elevated and in the overnight, she experienced another temperature of 100.6.  Successful IR drainage of the abscess occurred on 1/5 with about 100cc's of purulent drainage out of the tube almost immediately.  Pt began to improve after this drainage with improvement in her pain, fever curve and WBC ct.   On Post procedure day #1, WBC ct was decreased to 12.2.  Pt was able to be more mobile.  Appetite improved.  By post procedure day #2, pt had not had a temperature in about 48 hrs.  WBC ct improved to 11.2 but was not completely normal.  Repeat CT ordered.  Drain was in correct location.  Cystic adnexal lesion present on several prior imaging studies for several years was present.  No other areas requiring drainage were identified.  Patient was feeling more anxious later in the day and desirous of leaving the hospital.  Repeat CBC was obtained and was 9.8.  Pain continued to improve.  As I was concerned she would leave AMA again, she was seen by my partner, Dr. Jeralyn, who had seen her at admission so had a good baseline comparison.  Patient's exam was much improved and she was felt stable for discharge with completion of 14 days of antibiotics given >48 hours of being afebrile, drain still in place and draining small amounts, improved exam and normal WBC ct.  Decision made to  proceed with discharge to home with close follow up.  Consults: Interventional radiology for IR drainage of abscess  Significant Diagnostic Studies: labs: daily CBCs, BMPs, single blood culture was positive but was c/w skin contaminant and radiology: CT scan: abdomen and pelvis with findings as per above  Treatments: antibiotics: ceftriaxone , metronidazole , and doxycycline  and IR drainage of pelvic abscess  Discharge Exam: Physical Exam Constitutional:      Appearance: Normal appearance.  Cardiovascular:     Rate and Rhythm: Normal rate and regular rhythm.  Pulmonary:     Effort: Pulmonary effort is normal.     Breath sounds: Normal breath sounds.  Abdominal:     General: There is no distension.     Palpations: There is no mass.     Tenderness: There is abdominal tenderness (milld in LLQ). There is no guarding or rebound.     Hernia: No hernia is present.  Musculoskeletal:        General: Normal range of motion.  Skin:    General: Skin is warm and dry.  Neurological:     General: No focal deficit present.     Mental Status: She is alert.  Psychiatric:        Mood and Affect: Mood normal.      Disposition: Home or Self Care  Signed: CLEOTILDE CHRISTELLA AREA 07/02/2011, 6:26 PM

## 2024-12-28 NOTE — Therapy (Signed)
 " OUTPATIENT PHYSICAL THERAPY NEURO TREATMENT - RECERT   Patient Name: Kelli Cooper MRN: [id number] DOB:03-12-1981, 44 y.o., female Today's Date: 12/28/2024   PCP: Oley Bascom RAMAN, NP REFERRING PROVIDER: Oley Bascom RAMAN, NP  END OF SESSION:  PT End of Session - 12/28/24 1401     Visit Number 5    Number of Visits 15   recert   Date for Recertification  03/22/25   recert, to allow for scheduling delays   Authorization Type Medicaid [facility]    Authorization Time Period 9 visits approved 11/09/24-01/07/25    Authorization - Visit Number 4    Authorization - Number of Visits 9    PT Start Time 1400    PT Stop Time 1440    PT Time Calculation (min) 40 min    Activity Tolerance Patient tolerated treatment well    Behavior During Therapy WFL for tasks assessed/performed              Past Medical History:  Diagnosis Date   Anemia    Arthralgia    COVID    Iron  deficiency anemia    Muscle pain    Palpitations    Positive ANA (antinuclear antibody)    Prediabetes    Past Surgical History:  Procedure Laterality Date   arm surgery Left    Patient Active Problem List   Diagnosis Date Noted   Hypokalemia 12/17/2024   Submucous and subserous leiomyoma of uterus 12/17/2024   Adnexal mass 12/17/2024   Tubo-ovarian abscess 12/17/2024   Pelvic pain 12/14/2024   PID (acute pelvic inflammatory disease) 12/14/2024   Low vitamin D  level 04/05/2024   Heavy menstrual bleeding 02/01/2024   Iron  deficiency anemia secondary to blood loss (chronic) 09/17/2023   Positive ANA (antinuclear antibody) 08/06/2023   Muscle pain 07/30/2023    ONSET DATE: 10/13/2024 (referral date)  REFERRING DIAG: R53.81 (ICD-10-CM) - Physical deconditioning M54.50,G89.29 (ICD-10-CM) - Chronic bilateral low back pain without sciatica  THERAPY DIAG:  Physical deconditioning  Cervicalgia  Muscle weakness (generalized)  Other abnormalities of gait and mobility  Rationale for  Evaluation and Treatment: Habilitation  SUBJECTIVE:                                                                                                                                                                                             SUBJECTIVE STATEMENT: Kelli Cooper  Pt was hospitalized since last visit for a cyst that burst in her Fallopian tube/ovaries after her first dose of Lupron , hospitalized 1230/25 to 12/20/24. She now has an abdominal drain that is set to be removed on 1/21. Pt was  cleared to return to PT by her OBGyn Dr. Cleotilde who treated the patient during her hospital stay and saw her for follow-up on 12/24/23. She was instructed to increase activity slowly and as tolerated. Pt will start her next Lupron  injection on 1/22 but has a plan in case her pain returns.  Since being hospitalized pt reports feeing very deconditioned. She has been unable to walk her dog, [name]. She is able to walk to/from the stop sign on her road and was able to walk a short distance to a store (0.1 mi away) to get [name] dog food. She has been unable to do many of her exercises or her stretches due to the drain. She is uncomfortable laying on her back doing stretches as she can feel the drain pulling and is unable to lay on her stomach due to the drain. She also gets fatigued walking short distances in her home now and can't do as much around the house. She does enjoy going up/down the stairs and feels a good stretch with this. Right now taking a shower takes a lot more energy due to dressing changes and covering her drain while showering.  Pt accompanied by: self  PERTINENT HISTORY: PMH: long-Covid, positive ANA, arthralgia, anemia, B12 deficiency, mixed hyperlipidemia, prediabetes   PAIN:  Are you having pain? Yes: NPRS scale: 3/10 Pain location: abdomen Pain description: discomfort Aggravating factors: moving the wrong way Relieving factors: ibuprofen , pain medication  PRECAUTIONS: None  RED  FLAGS: None   WEIGHT BEARING RESTRICTIONS: No  FALLS: Has patient fallen in last 6 months? No  LIVING ENVIRONMENT: Lives with: lives alone, with their dog [name] Lives in: House/apartment Stairs: Yes: Internal: 12 steps; on right going up Has following equipment at home: Single point cane  PLOF: Independent with gait, Independent with transfers, and Requires assistive device for independence  PATIENT GOALS: pain management long term goal of being able to walk in the woods with a cane  OBJECTIVE:  Note: Objective measures were completed at Evaluation unless otherwise noted.  DIAGNOSTIC FINDINGS: None updated or relevant to this POC  COGNITION: Overall cognitive status: Within functional limits for tasks assessed   SENSATION: WFL  COORDINATION: WFL  EDEMA:  None  POSTURE: rounded shoulders and forward head  PALPATION: tightness in cervical paraspinals, thoracic paraspinals, rhomboids, and around shoulder blades with TTP in rhomboids and mid-trap region  CERVICAL ROM:   Active ROM A/PROM (deg) eval  Pain  Flexion 55 -  Extension 55 -  Right lateral flexion 25 Center and to the L - tightness  Left lateral flexion 20 Center and to the R - tightness  Right rotation 50 -  Left rotation 50 -   (Blank rows = not tested)   UPPER EXTREMITY ROM:  Active ROM Right eval Left eval  Shoulder flexion WFL, pain below shoulder blades at end range WFL, pain below shoulder blades at end range  Shoulder extension    Shoulder abduction WFL, pain in pecs at end range [facility], pain in pecs at end range  Shoulder adduction    Shoulder extension    Shoulder internal rotation WFL, tension at end range WFL, tension at end range  Shoulder external rotation [facility]  Elbow flexion    Elbow extension    Wrist flexion    Wrist extension    Wrist ulnar deviation    Wrist radial deviation    Wrist pronation    Wrist supination     (Blank rows = not  tested)   UPPER EXTREMITY  MMT:  MMT Right eval Left eval  Shoulder flexion 5 5  Shoulder extension    Shoulder abduction 5 5  Shoulder adduction    Shoulder extension    Shoulder internal rotation    Shoulder external rotation    Middle trapezius 3 3  Lower trapezius 3 3  Elbow flexion    Elbow extension    Wrist flexion    Wrist extension    Wrist ulnar deviation    Wrist radial deviation    Wrist pronation    Wrist supination    Grip strength     (Blank rows = not tested)    CERVICAL SPECIAL TESTS:  None assessed at eval  BED MOBILITY:  Mod I  TRANSFERS: Sit to stand: Modified independence  Assistive device utilized: Single point cane     Stand to sit: Modified independence  Assistive device utilized: Single point cane     Chair to chair: Modified independence  Assistive device utilized: Single point cane        GAIT: Findings: WFL  FUNCTIONAL TESTS:  None assessed at eval  PATIENT SURVEYS:  NDI: 32/50                                                                                                                              TREATMENT DATE:   TherAct To review current exercise program and discuss what would be appropriate to continue working on: Can do seated UE band exercises but without bands Hold off on supine therex until drain removed Hold off on prone exercises for now Can do seated neck exercises Can work on sit to stands Can work on increasing walking distance outdoors on daily walks  Reassessed cervical AROM, see LTG below   Self-Care Vitals:   12/28/24 1436  BP: 114/71  Pulse: 85   Assessed in RUE while seated at rest. Vitals WNL.   Physical Performance : 176 ft with SPC mod I, 6/10 RPE   [facility] PT Assessment - 12/28/24 1429       Ambulation/Gait   Gait velocity 32.8 ft over 19.47 sec = 1.68 ft/sec   with [facility]     Standardized Balance Assessment   Standardized Balance Assessment Five Times Sit to Stand    Five times sit to stand comments  20.06 sec    hands on thighs          PATIENT EDUCATION: Education details: continue HEP and activity levels as discussed (see above), results of OM and functional implications Person educated: Patient Education method: Explanation, Demonstration, Tactile cues, and Verbal cues Education comprehension: verbalized understanding, returned demonstration, and needs further education  HOME EXERCISE PROGRAM: Access Code: [facility] URL: [email or web address] Date: 11/03/2024 Prepared by: Waddell Southgate  Exercises - Supine Suboccipital Release with Tennis Balls  - 1 x daily - 7 x weekly - 1 sets - 1 reps - 5-10 min hold - Standing Bilateral Shoulder Internal  Rotation AAROM with Dowel  - 1 x daily - 7 x weekly - 3 sets - 10 reps - Seated Upper Trapezius Stretch  - 1 x daily - 7 x weekly - 1 sets - 3-5 reps - 30 sec hold - Gentle Levator Scapulae Stretch  - 1 x daily - 7 x weekly - 1 sets - 3-5 reps - 30 sec hold - Seated Cervical Traction  - 1 x daily - 7 x weekly - 1 sets - 10 reps - 10-15 sec hold - Prone Shoulder Flexion  - 1 x daily - 7 x weekly - 3 sets - 10 reps - Prone Single Arm Shoulder Y  - 1 x daily - 7 x weekly - 3 sets - 10 reps - Prone W Scapular Retraction  - 1 x daily - 7 x weekly - 3 sets - 10 reps - Prone Scapular Slide with Shoulder Extension  - 1 x daily - 7 x weekly - 3 sets - 10 reps - Seated High Lat Pull Down with Overhead Anchored Resistance  - 1 x daily - 7 x weekly - 3 sets - 10 reps - Shoulder extension with resistance - Neutral  - 1 x daily - 7 x weekly - 3 sets - 10 reps - Scapular Retraction with Resistance  - 1 x daily - 7 x weekly - 3 sets - 10 reps - Beginner Front Arm Support  - 1 x daily - 7 x weekly - 3 sets - 10 reps   Verbally added sit to stands 12/28/24  GOALS: Goals reviewed with patient? Yes  SHORT TERM GOALS: Target date: 11/24/2024   Pt will be independent with initial HEP for improved cervical ROM, improved posture and management of pain  symptoms in order to build upon functional gains made in therapy. Baseline: independent with current HEP (12/12) Goal status: MET   LONG TERM GOALS: Target date: 12/14/2024   Pt will be independent with final HEP for improved cervical ROM, improved posture and management of pain symptoms in order to build upon functional gains made in therapy. Baseline:  Goal status: IN PROGRESS  2.  Pt will increase her bilateral lateral cervical flexion by >/= 10 degrees for improved function. Baseline:  CERVICAL ROM:   Active ROM AROM (deg) Eval 11/12/2024  Pain AROM (deg) 12/28/24  Pain  Right lateral flexion 25 Center and to the L - tightness 45 No pain  Left lateral flexion 20 Center and to the R - tightness 25 No pain    Goal status: IN PROGRESS  3.  Pt will improve her score on the NDI to </= 27/50 for decreased pain and improved function Baseline: 32/50 (11/19), not reassessed due to time constraints (1/14) Goal status: IN PROGRESS  4.  Pt will increase her mid and lower trap strength to 4/5 for improved function and decreased pain Baseline: 3/5 (11/19), not reassessed as pt unable to get into prone position due to drain (1/4) Goal status: IN PROGRESS   NEW SHORT TERM GOALS:   Target date: 01/25/2025   Pt will improve gait velocity to at least 2.0 for improved gait efficiency and performance at mod I level  Baseline: 1.68 ft/sec with SPC (1/4) Goal status: INITIAL  2.  Pt will ambulate greater than or equal to 225 feet on with LRAD and mod I for improved cardiovascular endurance and BLE strength.  Baseline: 176 ft with SPC, RPE 6/10 (1/14) Goal status: INITIAL    NEW LONG  TERM GOALS:  Target date: 02/22/2025   Pt will improve gait velocity to at least 2.25 ft/sec for improved gait efficiency and performance at mod I level  Baseline: 1.68 ft/sec with SPC (1/4) Goal status: INITIAL  2.  Pt will ambulate greater than or equal to 275 feet on with LRAD and mod I for  improved cardiovascular endurance and BLE strength.  Baseline: 176 ft with SPC, RPE 6/10 (1/14) Goal status: INITIAL  3.  Pt will improve 5 x STS to less than or equal to 17 seconds to demonstrate improved functional strength and transfer efficiency with no UE support.  Baseline: 20.06 sec hands on thighs (1/14) Goal status: INITIAL  4.  Pt will improve her score on the NDI to </= 27/50 for decreased pain and improved function Baseline: 32/50 (11/19), not reassessed due to time constraints (1/14) Goal status: IN PROGRESS     ASSESSMENT:  CLINICAL IMPRESSION: Emphasis of skilled PT session on reassessing functional outcome measures as patient has been hospitalized since last visit and it has been about a month since she has been seen in this clinic. She was cleared by her OBGyn to resume PT. Also assessed LTG and wrote new LTG for recertification of PT services. Pt has made progress towards 4/4 LTG. Her HEP has not yet been finalized, she has improved her cervical lateral flexion though not enough to meet LTG, and unable to reassess NDI due to time constraints or trapezius strength as pt unable to safely get into testing position due to her drain. She does exhibit decreased gait speed as compared to last assessment and impaired endurance based on score and impaired functional LE strength based on 5xSTS score. She continues to benefit from skilled PT services to work towards improving her strength and endurance in order to increase her management of her pain symptoms and improve her function and return to her PLOF. Continue POC.    OBJECTIVE IMPAIRMENTS: decreased activity tolerance, decreased knowledge of condition, decreased ROM, decreased strength, increased fascial restrictions, impaired perceived functional ability, impaired UE functional use, improper body mechanics, postural dysfunction, and pain.   ACTIVITY LIMITATIONS: carrying, lifting, and reach over head  PARTICIPATION  LIMITATIONS: meal prep, cleaning, laundry, driving, shopping, community activity, occupation, and yard work  PERSONAL FACTORS: Time since onset of injury/illness/exacerbation, Transportation, and 3+ comorbidities:  long-Covid, positive ANA, arthralgia, anemia, B12 deficiency, mixed hyperlipidemia, prediabetesare also affecting patient's functional outcome.   REHAB POTENTIAL: Good  CLINICAL DECISION MAKING: Stable/uncomplicated  EVALUATION COMPLEXITY: Low  PLAN:  PT FREQUENCY: 1x/week  PT DURATION: 8 weeks + 8 weeks (recert)  PLANNED INTERVENTIONS: 02835- PT Re-evaluation, 97750- Physical Performance Testing, 97110-Therapeutic exercises, 97530- Therapeutic activity, [id number]- Neuromuscular re-education, 97535- Self Care, 02859- Manual therapy, [id number]- Aquatic Therapy, [phone]- Electrical stimulation (manual), [phone] (1-2 muscles), 20561 (3+ muscles)- Dry Needling, Patient/Family education, Taping, Joint mobilization, Spinal mobilization, Cryotherapy, and Moist heat  PLAN FOR NEXT SESSION: how is HEP, endurance, energy levels, check vitals, SciFit, supine therex   Waddell Southgate, PT Waddell Southgate, PT, DPT, CSRS  12/28/2024, 2:47 PM  For all possible CPT codes, reference the Planned Interventions line above.     Check all conditions that are expected to impact treatment: {Conditions expected to impact treatment:Medical complications related to COVID-19 and Presence of Medical Equipment   If treatment provided at initial evaluation, no treatment charged due to lack of authorization.             "

## 2024-12-29 ENCOUNTER — Other Ambulatory Visit: Payer: Self-pay

## 2024-12-29 ENCOUNTER — Encounter (HOSPITAL_BASED_OUTPATIENT_CLINIC_OR_DEPARTMENT_OTHER): Payer: Self-pay

## 2024-12-29 NOTE — Patient Instructions (Addendum)
 Visit Information  Thank you for taking time to visit with me today. Please don't hesitate to contact me if I can be of assistance to you before our next scheduled telephone appointment.  Our next appointment is by telephone on 01/06/25 at 1400  Following is a copy of your care plan:   Goals Addressed             This Visit's Progress    VBCI Transitions of Care (TOC) Care Plan   On track    Problems: Hospitalized at [facility] DC 12/21/24 - review Recent Hospitalization for treatment of Tubo-ovarian abscess s/p post op with drain  No Hospital Follow Up Provider appointment Was able to assist patient in making a BookIT appointment made with PCP , SDOH barrier: addressed patient states no current needs , and post op care review needs 12/29/24 Has PCP follow up Monday  01/02/25 and reviewed s/s of infections, has transportation to appointments, states all SDOH are currently met  Goal:  Over the next 30 days, the patient will not experience hospital readmission  Interventions:  Transitions of Care: Doctor Visits  - discussed the importance of doctor visits  Surgery (Tubo-ovarian abscess s/p surgical procedure): Evaluation of current treatment plan related to Tubo-ovarian abscess  surgery assessed patient/caregiver understanding of surgical procedure   reviewed post-operative instructions with patient/caregiver addressed questions about post - surgical incision care  reviewed medications with patient and addressed questions reviewed scheduled provider appointments with patientand assisted to get PCP appointment set up confirmed availability of transportation to all appointments patient has made arrangements through [facility] transportation arranged performed PHQ2-PHQ9 assessment     Patient Self Care Activities:  Attend all scheduled provider appointments Call pharmacy for medication refills 3-7 days in advance of running out of medications Call provider office for  new concerns or questions  Notify RN Care Manager of [facility] call rescheduling needs Participate in Transition of Care Program/Attend TOC scheduled calls Take medications as prescribed   12/29/24 Progressing in activity, saw OP PT and doing exercises, was able to walk a short distance to store without problems.  Plan:  The patient has been provided with contact information for the care management team and has been advised to call with any health related questions or concerns.  12/22/24 Discussed and offered 30 day TOC program.  Patient agrees to weekly calls in program.  The patient has been provided with contact information for the care management team and has been advised to call with any health -related questions or concerns.  The patient verbalized understanding with current plan of care.  The patient is directed to their insurance card regarding availability of benefits coverage for transportation - patient is active in setting up for needs.   12/29/24 Care gaps were reviewed with patient today and patient verbalized understanding to follow up with primary care provider during office visits and discuss any concerns.  Patient to follow up with PCP on 01/02/25 regarding getting the flu vaccine, thought it was discussed in the hospital but don't remember getting it. Medication list reviewed.         Patient verbalizes understanding of instructions and care plan provided today and agrees to view in MyChart. Active MyChart status and patient understanding of how to access instructions and care plan via MyChart confirmed with patient.     The patient has been provided with contact information for the care management team and has been advised to call with any health related questions  or concerns.   Please call the care guide team at [phone] if you need to cancel or reschedule your appointment.   Please call the Suicide and Crisis Lifeline: 988 call the [place]  National Suicide Prevention Lifeline:  432-735-5770 or TTY: 657-601-2558 TTY (717)692-5786) to talk to a trained counselor call 1-800-273-TALK (toll free, 24 hour hotline) go to [facility] [address], [place] [phone]) call 911 if you are experiencing a Mental Health or Behavioral Health Crisis or need someone to talk to.  Richerd Fish, RN, BSN, CCM [facility], [facility] Management Coordinator Direct Dial: [phone]

## 2024-12-29 NOTE — Transitions of Care (Post Inpatient/ED Visit) (Signed)
" °  Transition of Care week 2  Visit Note  12/29/2024  Name: Kelli Cooper MRN: [id number]          DOB: [birth year]  Situation: Patient enrolled in [facility] 30-day program. Visit completed with patient by telephone.   Background:   Initial Transition Care Management Follow-up Telephone Call Discharge Date and Diagnosis: 12/20/24, left tuboovarian abscess   Past Medical History:  Diagnosis Date   Anemia    Arthralgia    COVID    Iron  deficiency anemia    Muscle pain    Palpitations    Positive ANA (antinuclear antibody)    Prediabetes     Assessment: Patient Reported Symptoms: Cognitive Cognitive Status: No symptoms reported, Able to follow simple commands, Alert and oriented to person, place, and time, Insightful and able to interpret abstract concepts, Normal speech and language skills      Neurological Neurological Review of Symptoms: No symptoms reported [place] Neurological Symptoms/Conditions [RPT]: Chronic lower legs - due to long-COVID since 2022 - working with OP PT Neurological Management Strategies: Medication therapy, Routine screening Neurological Self-Management Outcome: 4 (good)  HEENT HEENT Symptoms Reported: No symptoms reported      Cardiovascular Cardiovascular Symptoms Reported: No symptoms reported Does patient have uncontrolled Hypertension?: No Cardiovascular Management Strategies: Routine screening  Respiratory Respiratory Symptoms Reported: No symptoms reported    Endocrine Endocrine Symptoms Reported: No symptoms reported Is patient diabetic?: No Endocrine Self-Management Outcome: 4 (good) Endocrine Comment: Awaiting Hysterectomy to start Lupron  injection on 01/05/25 checking with PCP as well  Gastrointestinal Gastrointestinal Symptoms Reported: No symptoms reported Additional Gastrointestinal Details: nausea comes in waves none reported today Gastrointestinal Management Strategies: Medication therapy    Genitourinary Genitourinary Symptoms  Reported: No symptoms reported    Integumentary Integumentary Symptoms Reported: Wound, Other Other Integumentary Symptoms: JP drain still emptying has follow up appointments; antibiotics to complete on 12/30/24 PM, flush port and assessment follow up on Monday 01/02/25 with PCP as well    Musculoskeletal Musculoskelatal Symptoms Reviewed: Weakness Additional Musculoskeletal Details: improving using cane only as needed now Musculoskeletal Management Strategies: Activity, Adequate rest (Reviewed for safety plan for no falls) Musculoskeletal Self-Management Outcome: 4 (good)      Psychosocial Psychosocial Symptoms Reported: No symptoms reported Additional Psychological Details: Patient reports HX of anxiety and depression currently controlled with medication, will follow up with PCP and counselor as needed. Behavioral Management Strategies: Counseling Behavioral Health Self-Management Outcome: 4 (good) Behavioral Health Comment: Follow up with Angeline Blunt at [facility] for counseling 12/30/24 was noted last week       Today's Vitals   12/29/24 1422  BP: 114/71   Pain Scale: 0-10 Pain Score: 2  Pain Type: Surgical pain Pain Location: Abdomen Pain Orientation: Mid, Lower Pain Descriptors / Indicators: Discomfort  Medications Reviewed Today   Medications were not reviewed in this encounter     Recommendation:   PCP Follow-up Continue Current Plan of Care  Follow Up Plan:   Telephone follow-up in 1 week  Richerd Fish, RN, BSN, CCM [facility], [facility] Management Coordinator Direct Dial: [phone]           "

## 2024-12-30 ENCOUNTER — Telehealth: Payer: Self-pay

## 2024-12-30 NOTE — Telephone Encounter (Signed)
 Auth Submission: NO AUTH NEEDED Site of care: Site of care: [facility] Payer: [place] [employer] medicaid Medication & CPT/J Code(s) submitted: Feraheme (ferumoxytol ) [id number] Diagnosis Code:  Route of submission (phone, fax, portal):  Phone # Fax # Auth type: Buy/Bill PB Units/visits requested: 510mg  x 2 doses Reference number:  Approval from: 11/25/24 to 03/14/25

## 2025-01-02 ENCOUNTER — Encounter: Payer: Self-pay | Admitting: Nurse Practitioner

## 2025-01-02 ENCOUNTER — Ambulatory Visit: Payer: Self-pay | Admitting: Nurse Practitioner

## 2025-01-02 ENCOUNTER — Other Ambulatory Visit (HOSPITAL_COMMUNITY): Payer: Self-pay

## 2025-01-02 VITALS — BP 124/76 | HR 74 | Temp 97.7°F | Wt 216.0 lb

## 2025-01-02 DIAGNOSIS — R7303 Prediabetes: Secondary | ICD-10-CM

## 2025-01-02 DIAGNOSIS — Z23 Encounter for immunization: Secondary | ICD-10-CM

## 2025-01-02 MED ORDER — HYDROXYZINE HCL 10 MG PO TABS
10.0000 mg | ORAL_TABLET | Freq: Three times a day (TID) | ORAL | 0 refills | Status: AC | PRN
  Filled 2025-01-02: qty 16, 6d supply, fill #0

## 2025-01-02 NOTE — Progress Notes (Incomplete)
 "  Referring Physician(s): Dr. Cleotilde  Supervising Physician: Jennefer Rover  Chief Complaint: The patient is seen in follow up today s/p left TOA drain placed 12/18/24  History of present illness:  Kelli Cooper, 44 year old female, has a medical history significant for anemia, long Covid and uterine fibroids. She presented to the ED 12/13/24 with left sided abdominal pain and fevers. She was found to have leukocytosis and imaging showed a large left adnexal mass suggestive of tubo-ovarian abscess. She was treated conservatively for several days without significant improvement. An MRI 12/16/24 showed a complex, thick-walled, septated left adnexal mass with bilateral iliac lymphadenopathy. IR was consulted for aspiration/drain placement and this was on the schedule for 12/17/24 however the patient left AMA to check on her animals.  She returned to the hospital later that day and was re-admitted. She was seen in IR 12/18/24 and received a 10 Fr LLQ drain. She was discharged from the hospital 12/20/24 and she presents to the IR outpatient clinic today for a drain evaluation. She is feeling well and denies any abdominal pain, nausea/vomiting, diarrhea, or fevers. She is flushing the drain once daily and has a daily output of approximately 5 ml.   Past Medical History:  Diagnosis Date   Anemia    Arthralgia    COVID    Iron  deficiency anemia    Muscle pain    Palpitations    Positive ANA (antinuclear antibody)    Prediabetes     Past Surgical History:  Procedure Laterality Date   arm surgery Left    IR RADIOLOGIST EVAL & MGMT  01/04/2025    Allergies: Patient has no known allergies.  Medications: Prior to Admission medications  Medication Sig Start Date End Date Taking? Authorizing Provider  Blood Pressure Monitoring (OMRON 3 SERIES BP MONITOR) DEVI Use daily as directed 08/11/24   Oley Bascom RAMAN, NP  cyanocobalamin  (VITAMIN B12) 1000 MCG tablet Take 1 tablet (1,000 mcg total) by  mouth daily. 11/06/23   Oley Bascom RAMAN, NP  cyclobenzaprine  (FLEXERIL ) 10 MG tablet Take 1 tablet (10 mg total) by mouth 3 (three) times daily as needed for muscle spasms. 10/13/24   Oley Bascom RAMAN, NP  hydrOXYzine  (ATARAX ) 10 MG tablet Take 1 tablet (10 mg total) by mouth 3 (three) times daily as needed for anxiety or itching. 01/02/25   Oley Bascom RAMAN, NP  ibuprofen  (ADVIL ) 200 MG tablet Take 200 mg by mouth every 6 (six) hours as needed.    [provider]  Iron , Ferrous Sulfate , 325 (65 Fe) MG TABS Take 325 mg by mouth daily. 11/06/23   Oley Bascom RAMAN, NP  LUPRON  DEPOT, 58-MONTH, 3.75 MG injection Inject into the muscle. 11/16/24   [provider]  norethindrone  (MICRONOR ) 0.35 MG tablet Take 1 tablet (0.35 mg total) by mouth daily. 04/04/24   Lo, Arland POUR, CNM  ondansetron  (ZOFRAN ) 4 MG tablet Take 1 tablet (4 mg total) by mouth every 6 (six) hours as needed for nausea. 12/20/24   Cleotilde Ronal RAMAN, MD  Oxycodone  HCl 10 MG TABS Take 1 tablet (10 mg total) by mouth every 4 (four) hours as needed. 12/20/24   Cleotilde Ronal RAMAN, MD  sodium chloride  flush 0.9 % SOLN injection 5 mLs by Intracatheter route daily. 12/18/24   Huneycutt, Brittany, NP  Vitamin D , Ergocalciferol , (DRISDOL ) 1.25 MG (50000 UNIT) CAPS capsule Take 1 capsule (50,000 Units total) by mouth every 7 (seven) days. Patient not taking: Reported on 01/02/2025 06/29/24   Cleotilde,  Ronal RAMAN, MD     Family History  Problem Relation Age of Onset   Healthy Mother    Healthy Father    Healthy Sister     Social History   Socioeconomic History   Marital status: Single    Spouse name: Not on file   Number of children: Not on file   Years of education: Not on file   Highest education level: Not on file  Occupational History   Occupation: Teach private voice lessons  Tobacco Use   Smoking status: Never    Passive exposure: Past   Smokeless tobacco: Never  Vaping Use   Vaping status: Former  Substance and Sexual Activity    Alcohol use: Not Currently   Drug use: Yes    Types: Marijuana    Comment: daily   Sexual activity: Not Currently  Other Topics Concern   Not on file  Social History Narrative   Not on file   Social Drivers of Health   Tobacco Use: Low Risk (01/02/2025)   Patient History    Smoking Tobacco Use: Never    Smokeless Tobacco Use: Never    Passive Exposure: Past  Financial Resource Strain: Not on file  Food Insecurity: Food Insecurity Present (12/22/2024)   Epic    Worried About Programme Researcher, Broadcasting/film/video in the Last Year: Sometimes true    Barista in the Last Year: Sometimes true  Transportation Needs: Unmet Transportation Needs (12/22/2024)   Epic    Lack of Transportation (Medical): Yes    Lack of Transportation (Non-Medical): Yes  Physical Activity: Not on file  Stress: Not on file  Social Connections: Not on file  Depression (PHQ2-9): High Risk (01/02/2025)   Depression (PHQ2-9)    PHQ-2 Score: 15  Alcohol Screen: Not on file  Housing: Low Risk (12/22/2024)   Epic    Unable to Pay for Housing in the Last Year: No    Number of Times Moved in the Last Year: 0    Homeless in the Last Year: No  Utilities: Not At Risk (12/22/2024)   Epic    Threatened with loss of utilities: No  Health Literacy: Not on file     Vital Signs: LMP 11/25/2024 (Approximate)   Physical Exam Constitutional:      General: She is not in acute distress.    Appearance: She is obese. She is not ill-appearing.  Pulmonary:     Effort: Pulmonary effort is normal.  Abdominal:     Tenderness: There is no abdominal tenderness.     Comments: LLQ drain to suction. Approximately 5 ml of serosanguineous fluid in bulb. Skin insertion site is unremarkable. Patient denies abdominal tenderness.   Skin:    General: Skin is warm and dry.  Neurological:     Mental Status: She is alert and oriented to person, place, and time.     Imaging:   Labs:  CBC: Recent Labs    12/18/24 0005 12/19/24 0413  12/20/24 0512 12/20/24 1636  WBC 15.2* 12.2* 11.6* 9.8  HGB 7.5* 9.5* 9.9* 10.6*  HCT 23.0* 29.6* 30.4* 32.6*  PLT 406* 403* 414* 467*    COAGS: Recent Labs    12/18/24 1019  INR 1.3*    BMP: Recent Labs    12/17/24 0453 12/18/24 0005 12/19/24 0413 12/20/24 0512  NA 141 140 138 141  K 3.1* 3.5 3.3* 3.7  CL 104 105 103 106  CO2 26 25 24 25   GLUCOSE 100* 143*  94 100*  BUN 6 7 6 6   CALCIUM  8.1* 8.2* 8.2* 8.2*  CREATININE 0.58 0.58 0.54 0.55  GFRNONAA >60 >60 >60 >60    LIVER FUNCTION TESTS: Recent Labs    08/11/24 1418 12/13/24 1615 12/14/24 1457 12/15/24 0647  BILITOT 0.2 0.4 0.5 0.3  AST 14 19 17 16   ALT 11 33 22 19  ALKPHOS 55 139* 114 106  PROT 7.0 7.4 6.2* 5.9*  ALBUMIN 4.4 3.8 3.0* 2.9*    Assessment and Plan:  44 year old female with a history of left TOA s/p drain placement in IR 12/18/24  CT imaging today showed a significantly decreased left adnexal fluid collection. Contrast injection under fluoroscopy was negative for a fistulous communication to adjacent structures.   Given the drain's daily net output of 0 ml along with the patient's report of overall well being the decision was made to remove the drain. Site care and return precautions discussed.   Plan: Follow up with IR as needed  Electronically Signed: Warren JONELLE Dais 01/04/2025, 1:37 PM   I spent a total of 25 Minutes in face to face in clinical consultation, greater than 50% of which was counseling/coordinating care for left TOA.      "

## 2025-01-02 NOTE — Progress Notes (Signed)
 "  Subjective   Patient ID: Kelli Cooper, female    DOB: 11/24/1981, 44 y.o.   MRN: [id number]  Chief Complaint  Patient presents with   Hospitalization Follow-up    Would like Hydroxyzine  re-prescribed for anxiety.     Referring provider: Oley Bascom RAMAN, NP  Kelli Cooper is a 44 y.o. female with Past Medical History: No date: Anemia No date: Arthralgia No date: COVID No date: Iron  deficiency anemia No date: Muscle pain No date: Palpitations No date: Positive ANA (antinuclear antibody) No date: Prediabetes   HPI   Patient presents today for follow-up visit.  She is currently following with OB/GYN after ovarian abscess and recent hospitalization.  She does have a drain to her left abdomen which will hopefully be taken out this week.  Overall patient is doing well since hospital discharge.  We will check labs today. Denies f/c/s, n/v/d, hemoptysis, PND, leg swelling Denies chest pain or edema        Allergies[1]  Immunization History  Administered Date(s) Administered   Influenza, Seasonal, Injecte, Preservative Fre 01/02/2025   PFIZER(Purple Top)SARS-COV-2 Vaccination 03/29/2020, 04/23/2020    Tobacco History: Tobacco Use History[2] Counseling given: Not Answered   Outpatient Encounter Medications as of 01/02/2025  Medication Sig   Blood Pressure Monitoring (OMRON 3 SERIES BP MONITOR) DEVI Use daily as directed   cyanocobalamin  (VITAMIN B12) 1000 MCG tablet Take 1 tablet (1,000 mcg total) by mouth daily.   cyclobenzaprine  (FLEXERIL ) 10 MG tablet Take 1 tablet (10 mg total) by mouth 3 (three) times daily as needed for muscle spasms.   ibuprofen  (ADVIL ) 200 MG tablet Take 200 mg by mouth every 6 (six) hours as needed.   Iron , Ferrous Sulfate , 325 (65 Fe) MG TABS Take 325 mg by mouth daily.   LUPRON  DEPOT, 53-MONTH, 3.75 MG injection Inject into the muscle.   norethindrone  (MICRONOR ) 0.35 MG tablet Take 1 tablet (0.35 mg total) by mouth daily.    ondansetron  (ZOFRAN ) 4 MG tablet Take 1 tablet (4 mg total) by mouth every 6 (six) hours as needed for nausea.   Oxycodone  HCl 10 MG TABS Take 1 tablet (10 mg total) by mouth every 4 (four) hours as needed.   sodium chloride  flush 0.9 % SOLN injection 5 mLs by Intracatheter route daily.   [DISCONTINUED] hydrOXYzine  (ATARAX ) 10 MG tablet Take 1 tablet (10 mg total) by mouth 3 (three) times daily as needed for anxiety or itching.   hydrOXYzine  (ATARAX ) 10 MG tablet Take 1 tablet (10 mg total) by mouth 3 (three) times daily as needed for anxiety or itching.   Vitamin D , Ergocalciferol , (DRISDOL ) 1.25 MG (50000 UNIT) CAPS capsule Take 1 capsule (50,000 Units total) by mouth every 7 (seven) days. (Patient not taking: Reported on 01/02/2025)   No facility-administered encounter medications on file as of 01/02/2025.    Review of Systems  Review of Systems  Constitutional: Negative.   HENT: Negative.    Cardiovascular: Negative.   Gastrointestinal: Negative.   Allergic/Immunologic: Negative.   Neurological: Negative.   Psychiatric/Behavioral: Negative.       Objective:   BP 124/76   Pulse 74   Temp 97.7 F (36.5 C) (Temporal)   Wt 216 lb (98 kg)   LMP 11/25/2024 (Approximate)   SpO2 100%   BMI 38.26 kg/m   Wt Readings from Last 5 Encounters:  01/02/25 216 lb (98 kg)  12/23/24 218 lb (98.9 kg)  12/14/24 220 lb (99.8 kg)  12/05/24 219 lb (99.3  kg)  11/24/24 228 lb 3.2 oz (103.5 kg)     Physical Exam Vitals and nursing note reviewed.  Constitutional:      General: She is not in acute distress.    Appearance: She is well-developed.  Cardiovascular:     Rate and Rhythm: Normal rate and regular rhythm.  Pulmonary:     Effort: Pulmonary effort is normal.     Breath sounds: Normal breath sounds.  Neurological:     Mental Status: She is alert and oriented to person, place, and time.       Assessment & Plan:   Need for influenza vaccination -     Flu vaccine trivalent PF,  6mos and older(Flulaval,Afluria,Fluarix,Fluzone )  Prediabetes -     CBC -     Comprehensive metabolic panel with GFR -     Hemoglobin A1c  Other orders -     hydrOXYzine  HCl; Take 1 tablet (10 mg total) by mouth 3 (three) times daily as needed for anxiety or itching.  Dispense: 16 tablet; Refill: 0     Return in about 3 months (around 04/02/2025).     Bascom GORMAN Borer, NP 01/02/2025     [1] No Known Allergies [2]  Social History Tobacco Use  Smoking Status Never   Passive exposure: Past  Smokeless Tobacco Never   "

## 2025-01-03 LAB — COMPREHENSIVE METABOLIC PANEL WITH GFR
ALT: 8 IU/L (ref 0–32)
AST: 14 IU/L (ref 0–40)
Albumin: 3.9 g/dL (ref 3.9–4.9)
Alkaline Phosphatase: 53 IU/L (ref 41–116)
BUN/Creatinine Ratio: 14 (ref 9–23)
BUN: 9 mg/dL (ref 6–24)
Bilirubin Total: <0.2 mg/dL (ref 0.0–1.2)
CO2: 21 mmol/L (ref 20–29)
Calcium: 9.1 mg/dL (ref 8.7–10.2)
Chloride: 105 mmol/L (ref 96–106)
Creatinine, Ser: 0.65 mg/dL (ref 0.57–1.00)
Globulin, Total: 2.6 g/dL (ref 1.5–4.5)
Glucose: 96 mg/dL (ref 70–99)
Potassium: 5.3 mmol/L — ABNORMAL HIGH (ref 3.5–5.2)
Sodium: 141 mmol/L (ref 134–144)
Total Protein: 6.5 g/dL (ref 6.0–8.5)
eGFR: 110 mL/min/1.73

## 2025-01-03 LAB — CBC
Hematocrit: 38.7 % (ref 34.0–46.6)
Hemoglobin: 12.2 g/dL (ref 11.1–15.9)
MCH: 27.5 pg (ref 26.6–33.0)
MCHC: 31.5 g/dL (ref 31.5–35.7)
MCV: 87 fL (ref 79–97)
Platelets: 368 x10E3/uL (ref 150–450)
RBC: 4.44 x10E6/uL (ref 3.77–5.28)
RDW: 13.6 % (ref 11.7–15.4)
WBC: 6 x10E3/uL (ref 3.4–10.8)

## 2025-01-03 LAB — HEMOGLOBIN A1C
Est. average glucose Bld gHb Est-mCnc: 111 mg/dL
Hgb A1c MFr Bld: 5.5 % (ref 4.8–5.6)

## 2025-01-04 ENCOUNTER — Ambulatory Visit
Admission: RE | Admit: 2025-01-04 | Discharge: 2025-01-04 | Disposition: A | Source: Ambulatory Visit | Attending: Obstetrics and Gynecology

## 2025-01-04 ENCOUNTER — Ambulatory Visit: Payer: Self-pay | Admitting: Nurse Practitioner

## 2025-01-04 ENCOUNTER — Inpatient Hospital Stay: Admission: RE | Admit: 2025-01-04 | Discharge: 2025-01-04 | Disposition: A | Source: Ambulatory Visit

## 2025-01-04 ENCOUNTER — Ambulatory Visit: Admitting: Physical Therapy

## 2025-01-04 DIAGNOSIS — N7093 Salpingitis and oophoritis, unspecified: Secondary | ICD-10-CM

## 2025-01-04 HISTORY — PX: IR RADIOLOGIST EVAL & MGMT: IMG5224

## 2025-01-04 MED ORDER — IOHEXOL 300 MG/ML  SOLN
100.0000 mL | Freq: Once | INTRAMUSCULAR | Status: AC | PRN
  Administered 2025-01-04: 100 mL via INTRAVENOUS

## 2025-01-04 MED ORDER — IOPAMIDOL (ISOVUE-300) INJECTION 61%
30.0000 mL | Freq: Once | INTRAVENOUS | Status: AC | PRN
  Administered 2025-01-04: 7 mL

## 2025-01-05 ENCOUNTER — Ambulatory Visit (HOSPITAL_BASED_OUTPATIENT_CLINIC_OR_DEPARTMENT_OTHER): Payer: Self-pay

## 2025-01-05 ENCOUNTER — Telehealth (HOSPITAL_BASED_OUTPATIENT_CLINIC_OR_DEPARTMENT_OTHER): Payer: Self-pay

## 2025-01-05 NOTE — Telephone Encounter (Signed)
 Spoke with patient. Advised patient I have reviewed with Dr.Miller and she is okay with her proceeding with her Lupron  today but it is also okay to hold at this time. Patient would like to hold on Lupron  at this time. Advised patient with history of a tubo-ovarian abscess surgery will be more complex and it would be recommended to have a referral for consult with a surgeon who operates on more complex cases. Patient is agreeable for referral at this time. Advised I will let Dr.Miller know so that referral can be placed. Advised the office we refer her to will be in contact to schedule an appointment. Patient is agreeable.

## 2025-01-06 ENCOUNTER — Telehealth: Payer: Self-pay

## 2025-01-06 NOTE — Transitions of Care (Post Inpatient/ED Visit) (Signed)
 " Transition of Care week 3  Visit Note  01/06/2025  Name: Kelli Cooper MRN: [id number]          DOB: 1981-08-07  Situation: Patient enrolled in [facility] 30-day program. Visit completed with patient by telephone.   Background:   Initial Transition Care Management Follow-up Telephone Call Discharge Date and Diagnosis: 12/20/24, left tuboovarian abscess   Past Medical History:  Diagnosis Date   Anemia    Arthralgia    COVID    Iron  deficiency anemia    Muscle pain    Palpitations    Positive ANA (antinuclear antibody)    Prediabetes     Assessment: Patient Reported Symptoms: Cognitive Cognitive Status: No symptoms reported, Able to follow simple commands, Alert and oriented to person, place, and time, Insightful and able to interpret abstract concepts, Normal speech and language skills      Neurological Neurological Review of Symptoms: No symptoms reported Oher Neurological Symptoms/Conditions [RPT]: Chronic lower legs - ongoing progression Neurological Management Strategies: Medication therapy, Routine screening Neurological Self-Management Outcome: 4 (good)  HEENT HEENT Symptoms Reported: No symptoms reported      Cardiovascular Cardiovascular Symptoms Reported: No symptoms reported Does patient have uncontrolled Hypertension?: No Cardiovascular Management Strategies: Routine screening  Respiratory Respiratory Symptoms Reported: No symptoms reported Respiratory Self-Management Outcome: 4 (good)  Endocrine Endocrine Symptoms Reported: No symptoms reported Is patient diabetic?: No Endocrine Self-Management Outcome: 4 (good)  Gastrointestinal Gastrointestinal Symptoms Reported: Flatulence Additional Gastrointestinal Details: Taking OTC if needs arises, other than that going pretty good Gastrointestinal Management Strategies: Medication therapy Gastrointestinal Self-Management Outcome: 4 (good)    Genitourinary Genitourinary Symptoms Reported: No symptoms  reported Genitourinary Self-Management Outcome: 4 (good)  Integumentary Integumentary Symptoms Reported: Wound Other Integumentary Symptoms: drain is out and site is healing Skin Self-Management Outcome: 4 (good)  Musculoskeletal Musculoskelatal Symptoms Reviewed: No symptoms reported Additional Musculoskeletal Details: chronic weakness, day by day Musculoskeletal Management Strategies: Activity, Adequate rest Musculoskeletal Self-Management Outcome: 4 (good)      Psychosocial Psychosocial Symptoms Reported: No symptoms reported Additional Psychological Details: Patient reports HX of anxiety and depression currently controlled with medication, will follow up with PCP and counselor as needed. No problems have meds. Behavioral Management Strategies: Counseling Behavioral Health Self-Management Outcome: 4 (good) Behavioral Health Comment: [facility]       There were no vitals filed for this visit. Pain Scale: 0-10 Pain Score: 0-No pain Pain Type: Chronic pain, Surgical pain (Better with the drain out) Pain Location: Abdomen  Medications Reviewed Today     Reviewed by Eilleen Richerd GRADE, RN (Registered Nurse) on 01/06/25 at 1413  Med List Status: <None>   Medication Order Taking? Sig Documenting Provider Last Dose Status Informant  Blood Pressure Monitoring (OMRON 3 SERIES BP MONITOR) DEVI [id number]  Use daily as directed Oley Bascom RAMAN, NP  Active            Med Note LESLY, RICHERD GRADE Schaumann Dec 29, 2024  1:56 PM) Only check as needed  cyanocobalamin  (VITAMIN B12) 1000 MCG tablet [id number] Yes Take 1 tablet (1,000 mcg total) by mouth daily. Oley Bascom RAMAN, NP  Active   cyclobenzaprine  (FLEXERIL ) 10 MG tablet [id number] Yes Take 1 tablet (10 mg total) by mouth 3 (three) times daily as needed for muscle spasms. Oley Bascom RAMAN, NP  Active   hydrOXYzine  (ATARAX ) 10 MG tablet [id number] Yes Take 1 tablet (10 mg total) by mouth 3 (three) times daily as needed  for anxiety or itching.  Oley Bascom RAMAN, NP  Active   ibuprofen  (ADVIL ) 200 MG tablet [id number] Yes Take 200 mg by mouth every 6 (six) hours as needed. [provider]  Active            Med Note SAMULE, [facility]   Wed Jul 29, 2023  3:20 PM) prn  Iron , Ferrous Sulfate , 325 (65 Fe) MG TABS [id number] Yes Take 325 mg by mouth daily. Oley Bascom RAMAN, NP  Active   LUPRON  DEPOT, 55-MONTH, 3.75 MG injection [id number]  Inject into the muscle.  Patient not taking: Reported on 01/06/2025   [provider]  Active            Med Note LESLY, RICHERD GRADE   Fri Jan 06, 2025  2:10 PM) Skipped yesterday  norethindrone  (MICRONOR ) 0.35 MG tablet [id number] Yes Take 1 tablet (0.35 mg total) by mouth daily. Tad Arland POUR, CNM  Active   ondansetron  (ZOFRAN ) 4 MG tablet [id number] Yes Take 1 tablet (4 mg total) by mouth every 6 (six) hours as needed for nausea. Cleotilde Ronal RAMAN, MD  Active   Oxycodone  HCl 10 MG TABS [id number] Yes Take 1 tablet (10 mg total) by mouth every 4 (four) hours as needed. Cleotilde Ronal RAMAN, MD  Active            Med Note [place], RICHERD GRADE Schaumann Dec 29, 2024  2:12 PM) Patient wants to discuss with PCP of lowering the dosage or a different pain regimen on 12/30/24  sodium chloride  flush 0.9 % SOLN injection [id number]  5 mLs by Intracatheter route daily. Huneycutt, Brittany, NP  Active   Vitamin D , Ergocalciferol , (DRISDOL ) 1.25 MG (50000 UNIT) CAPS capsule [id number]  Take 1 capsule (50,000 Units total) by mouth every 7 (seven) days.  Patient not taking: Reported on 01/06/2025   Cleotilde Ronal RAMAN, MD  Active             Recommendation:   Continue Current Plan of Care  Follow Up Plan:   Telephone follow-up in 1 week scheduled  Richerd Fish, RN, BSN, CCM [facility], [facility] RN Care Manager Direct Dial: [phone]           "

## 2025-01-06 NOTE — Patient Instructions (Addendum)
 Visit Information  Thank you for taking time to visit with me today. Please don't hesitate to contact me if I can be of assistance to you before our next scheduled telephone appointment.  Our next appointment is by telephone on 01/12/25 at 1400  Following is a copy of your care plan:   Goals Addressed             This Visit's Progress    VBCI Transitions of Care (TOC) Care Plan   On track    Problems: Hospitalized at [facility] 12/21/24 - review Recent Hospitalization for treatment of Tubo-ovarian abscess s/p post op with drain  No Hospital Follow Up Provider appointment Was able to assist patient in making a [facility] appointment made with PCP , SDOH barrier: addressed patient states no current needs , and post op care review needs 12/29/24 Has PCP follow up Monday  01/02/25 and reviewed s/s of infections, has transportation to appointments, states all SDOH are currently met 01/06/25 Drain removed, reviewed s/s of infection, check site for redness, swelling, check for fever, uncontrolled pain. Patient states another pocket found, have upcoming appointment for discussion on hysterectomy, reviewed appointments and patient will call to adjust as noted two appointments for 01/23/25.  Goal:  Over the next 30 days, the patient will not experience hospital readmission  Interventions:  Transitions of Care: Doctor Visits  - discussed the importance of doctor visits  Surgery (Tubo-ovarian abscess s/p surgical procedure): Evaluation of current treatment plan related to Tubo-ovarian abscess  surgery assessed patient/caregiver understanding of surgical procedure   reviewed post-operative instructions with patient/caregiver addressed questions about post - surgical incision care  reviewed medications with patient and addressed questions reviewed scheduled provider appointments with patientand assisted to get PCP appointment set up confirmed availability of transportation to all  appointments patient has made arrangements through [facility] transportation arranged performed PHQ2-PHQ9 assessment     Patient Self Care Activities:  Attend all scheduled provider appointments Call pharmacy for medication refills 3-7 days in advance of running out of medications Call provider office for new concerns or questions  Notify RN Care Manager of [facility] call rescheduling needs Participate in Transition of Care Program/Attend TOC scheduled calls Take medications as prescribed   01/06/25 Progressing in activity, saw OP PT and doing exercises, was able to walk a short distance to store without problems.  Plan:  The patient has been provided with contact information for the care management team and has been advised to call with any health related questions or concerns.  12/22/24 Discussed and offered 30 day TOC program.  Patient agrees to weekly calls in program.  The patient has been provided with contact information for the care management team and has been advised to call with any health -related questions or concerns.  The patient verbalized understanding with current plan of care.  The patient is directed to their insurance card regarding availability of benefits coverage for transportation - patient is active in setting up for needs.   12/29/24 Care gaps were reviewed with patient today and patient verbalized understanding to follow up with primary care provider during office visits and discuss any concerns.  Patient to follow up with PCP on 01/02/25 regarding getting the flu vaccine, thought it was discussed in the hospital but don't remember getting it. Medication list reviewed.  01/06/25 Continue care plan and follow up next week, 01/12/25  preparing for winter storm reviewed and states needs are met.  Patient verbalizes understanding of instructions and care plan provided today and agrees to view in MyChart. Active MyChart status and patient understanding of how to access  instructions and care plan via MyChart confirmed with patient.     The patient has been provided with contact information for the care management team and has been advised to call with any health related questions or concerns.   Please call the care guide team at [phone] if you need to cancel or reschedule your appointment.   Please call the Suicide and Crisis Lifeline: 988 call the [place]  National Suicide Prevention Lifeline: (606)374-5300 or TTY: (541)693-2994 TTY 934-474-9172) to talk to a trained counselor call 1-800-273-TALK (toll free, 24 hour hotline) go to [facility] [address] [place] [phone]) call 911 if you are experiencing a Mental Health or Behavioral Health Crisis or need someone to talk to.  Richerd Fish, RN, BSN, CCM [facility], [facility] RN Care Manager Direct Dial: [phone]

## 2025-01-06 NOTE — Progress Notes (Signed)
 This encounter was created in error - please disregard.

## 2025-01-09 ENCOUNTER — Ambulatory Visit

## 2025-01-10 ENCOUNTER — Encounter: Payer: Self-pay | Admitting: Physician Assistant

## 2025-01-10 ENCOUNTER — Ambulatory Visit: Admitting: Physician Assistant

## 2025-01-10 VITALS — BP 122/80 | Wt 213.4 lb

## 2025-01-10 DIAGNOSIS — M4316 Spondylolisthesis, lumbar region: Secondary | ICD-10-CM | POA: Diagnosis not present

## 2025-01-10 DIAGNOSIS — R29898 Other symptoms and signs involving the musculoskeletal system: Secondary | ICD-10-CM

## 2025-01-10 NOTE — Progress Notes (Signed)
 "  Referring Physician:  Oley Bascom RAMAN, NP [address] [place],  [place] [address]  Primary Physician:  Oley Bascom RAMAN, NP  History of Present Illness:  Kelli Cooper is here today with chronic back pain and bilateral lower extremity weakness.  The majority of her pain is actually in her neck and thoracic spine, but she is concerned that both of her legs feel heavy and she is had difficulty walking as a result.  This is equal in both legs.  She has had to use a cane to ambulate.  She feels as though her legs are heavy and they feel like Jell-O.  She does have pins-and-needles in her legs only when sitting  for prolonged period of time.  This began a few years ago after having COVID.  She denies any issues with balance.  No saddle anesthesia.   01/10/25  Saw patient in follow-up today.  She continues to persuade in physical therapy which has been helping some.  She has had recent hospitalization due to gynecologic issues that have been a more pressing matter.  She continues to walk with a cane and feels weak in bilateral lower extremities.  She denies any numbness and tingling.  She does not feel as though 1 leg is weaker than the other.  She does feel as though her low back pain has improved.  She denies any issues with balance or her gait.     Bowel/Bladder Dysfunction: none  Conservative measures:  Physical therapy:  Has participated in 08/2023-10/2023, 05/2024-07/2024. Resumed 10/2024 on going. Multimodal medical therapy including regular antiinflammatories:  Cyclobenzaprine , Toradol , ibuprofen   Injections:  epidural steroid injections?  Past Surgery:   EDDA OREA has no symptoms of cervical myelopathy.  The symptoms are causing a significant impact on the patient's life.   Review of Systems:  A 10 point review of systems is negative, except for the pertinent positives and negatives detailed in the HPI.  Past Medical History: Past Medical History:   Diagnosis Date   Anemia    Arthralgia    COVID    Iron  deficiency anemia    Muscle pain    Palpitations    Positive ANA (antinuclear antibody)    Prediabetes     Past Surgical History: Past Surgical History:  Procedure Laterality Date   arm surgery Left    IR RADIOLOGIST EVAL & MGMT  01/04/2025    Allergies: Allergies as of 01/10/2025   (No Known Allergies)    Medications: Outpatient Encounter Medications as of 01/10/2025  Medication Sig   Blood Pressure Monitoring (OMRON 3 SERIES BP MONITOR) DEVI Use daily as directed   cyanocobalamin  (VITAMIN B12) 1000 MCG tablet Take 1 tablet (1,000 mcg total) by mouth daily.   cyclobenzaprine  (FLEXERIL ) 10 MG tablet Take 1 tablet (10 mg total) by mouth 3 (three) times daily as needed for muscle spasms.   hydrOXYzine  (ATARAX ) 10 MG tablet Take 1 tablet (10 mg total) by mouth 3 (three) times daily as needed for anxiety or itching.   ibuprofen  (ADVIL ) 200 MG tablet Take 200 mg by mouth every 6 (six) hours as needed.   Iron , Ferrous Sulfate , 325 (65 Fe) MG TABS Take 325 mg by mouth daily.   LUPRON  DEPOT, 17-MONTH, 3.75 MG injection Inject into the muscle.   norethindrone  (MICRONOR ) 0.35 MG tablet Take 1 tablet (0.35 mg total) by mouth daily.   ondansetron  (ZOFRAN ) 4 MG tablet Take 1 tablet (4 mg total) by mouth every  6 (six) hours as needed for nausea.   Oxycodone  HCl 10 MG TABS Take 1 tablet (10 mg total) by mouth every 4 (four) hours as needed.   sodium chloride  flush 0.9 % SOLN injection 5 mLs by Intracatheter route daily.   [DISCONTINUED] Vitamin D , Ergocalciferol , (DRISDOL ) 1.25 MG (50000 UNIT) CAPS capsule Take 1 capsule (50,000 Units total) by mouth every 7 (seven) days. (Patient not taking: Reported on 01/06/2025)   No facility-administered encounter medications on file as of 01/10/2025.    Social History: Social History   Tobacco Use   Smoking status: Never    Passive exposure: Past   Smokeless tobacco: Never  Vaping Use    Vaping status: Former  Substance Use Topics   Alcohol use: Not Currently   Drug use: Yes    Types: Marijuana    Comment: daily    Family Medical History: Family History  Problem Relation Age of Onset   Healthy Mother    Healthy Father    Healthy Sister     Physical Examination: @VITALWITHPAIN @  General: Patient is well developed, well nourished, calm, collected, and in no apparent distress. Attention to examination is appropriate.  Psychiatric: Patient is non-anxious.  Head:  Pupils equal, round, and reactive to light.  ENT:  Oral mucosa appears well hydrated.  Neck:   Supple.  Full range of motion.  Respiratory: Patient is breathing without any difficulty.  Extremities: No edema.  Vascular: Palpable dorsal pedal pulses.  Skin:   On exposed skin, there are no abnormal skin lesions.  NEUROLOGICAL:     Awake, alert, oriented to person, place, and time.  Speech is clear and fluent. Fund of knowledge is appropriate.   Cranial Nerves: Pupils equal round and reactive to light.  Facial tone is symmetric.   ROM of spine: Tenderness to palpation of patient's paraspinals.    Strength:  5/5 strength in bilateral upper extremities.   Side Iliopsoas Quads Hamstring PF DF EHL  R 5 5 5 5 5 5   L 5 5 5 5 5 5    Reflexes are 1+ bilateral patella, absent Achilles bilaterally.  Diffusely hyporeflexic in bilateral upper extremities. Clonus is not present.  Toes are down-going.  Bilateral upper and lower extremity sensation is intact to light touch.    Patient is walking with a cane.  Medical Decision Making  Imaging:  EXAM: MRI LUMBAR SPINE WITHOUT CONTRAST   TECHNIQUE: Multiplanar, multisequence MR imaging of the lumbar spine was performed. No intravenous contrast was administered.   COMPARISON:  Prior radiograph from 11/14/2024.   FINDINGS: Segmentation: Standard. Lowest well-formed disc space labeled the L5-S1 level.   Alignment: Mild levoscoliosis.  Alignment otherwise normal preservation of the normal lumbar lordosis. No listhesis.   Vertebrae: Vertebral body height maintained without acute or chronic fracture. Decreased T1 signal intensity noted throughout the visualized bone marrow, nonspecific, but most commonly related to anemia, smoking or obesity. No worrisome osseous lesions. Mild reactive marrow edema present about the right L4-5 facet due to facet arthritis.   Conus medullaris and cauda equina: Conus extends to the L1 level. Conus and cauda equina appear normal.   Paraspinal and other soft tissues: Unremarkable.   Disc levels:   T11-12: Seen only on sagittal projection. Disc desiccation with mild disc bulge and reactive endplate spurring. No significant spinal stenosis. Foramina appear patent.   T12-L1: Unremarkable.   L1-2: Disc desiccation with mild diffuse disc bulge. No spinal stenosis. Foramina remain patent.   L2-3: Normal interspace. Mild  bilateral facet hypertrophy. No significant spinal stenosis. Foramina remain patent.   L3-4: Mild intervertebral disc space narrowing with disc desiccation, but no significant disc bulge. Mild-to-moderate bilateral facet hypertrophy with trace joint effusions. No significant spinal stenosis. Foramina remain patent.   L4-5: Mild intervertebral disc space narrowing without significant disc bulge. Moderate bilateral facet arthrosis with small joint effusion on the right. No significant spinal stenosis. Mild left L4 foraminal narrowing. Right neural foramen remains patent.   L5-S1: Degenerative intervertebral disc space narrowing with disc desiccation and diffuse disc bulge. Reactive endplate change with marginal endplate osteophytic spurring. Mild bilateral facet hypertrophy. No significant spinal stenosis. Mild left greater than right L5 foraminal narrowing.   IMPRESSION: 1. 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 No significant stenosis or  overt neural impingement. 2. Mild left L4 and bilateral L5 foraminal narrowing related to disc bulge and facet hypertrophy. 3. Mild to moderate bilateral facet hypertrophy at L2-3 through L5-S1, most pronounced at L4-5 on the right where there is associated reactive marrow edema. Finding could contribute to lower back pain.  EXAM: 4 VIEW(S) XRAY OF THE LUMBAR SPINE 11/14/2024 11:58:58 AM   COMPARISON: None available.   CLINICAL HISTORY: include flex/ex   FINDINGS:   LUMBAR SPINE: BONES: Vertebral body heights are maintained. Alignment is normal.   DISCS AND DEGENERATIVE CHANGES: Facet arthrosis L3-L4 and L4-L5 not well profiled on this examination. Mild degenerative disc disease with disc space narrowing at L3-L4, L4-L5, and L5-S1.   SOFT TISSUES: No acute abnormality.   IMPRESSION: 1. No acute lumbar spine abnormality identified. Chronic mild degenerative disc disease with disc space narrowing at L3-4, L4-5, and L5-S1. Chronic facet arthrosis at L3-4 and L4-5, suboptimally profiled on this examination  Assessment and Plan: Kelli Cooper is a pleasant 44 y.o. female with chronic back pain whose main concern today is bilateral leg weakness.  This began after COVID and patient now has to use a cane to walk secondary to her legs feeling heavy and weak.  This has persisted despite physical therapy.  Her MRI does show some degenerative disc disease and facet arthrosis, but no significant stenosis.  She does have a slight listhesis at L4-5.  Pain and weakness is not in a dermatomal distribution and does not correlate with her MRI.  I have advised the patient to continue physical therapy as I believe this would be very helpful for her.  In addition, I would like to have her see neurology for myopathy/weakness workup.  She may follow-up with us  on an as-needed basis.  Thank you for involving me in the care of this patient.     Lyle Decamp, PA-C Dept. of Neurosurgery  "

## 2025-01-11 ENCOUNTER — Ambulatory Visit: Admitting: Physical Therapy

## 2025-01-11 VITALS — BP 102/72 | HR 75

## 2025-01-11 DIAGNOSIS — M6281 Muscle weakness (generalized): Secondary | ICD-10-CM

## 2025-01-11 DIAGNOSIS — R2689 Other abnormalities of gait and mobility: Secondary | ICD-10-CM

## 2025-01-11 DIAGNOSIS — R5381 Other malaise: Secondary | ICD-10-CM

## 2025-01-11 DIAGNOSIS — M542 Cervicalgia: Secondary | ICD-10-CM

## 2025-01-11 NOTE — Therapy (Signed)
 " OUTPATIENT PHYSICAL THERAPY NEURO TREATMENT   Patient Name: Kelli Cooper MRN: [id number] DOB:08-17-1981, 44 y.o., female Today's Date: 01/11/2025   PCP: Oley Bascom RAMAN, NP REFERRING PROVIDER: Oley Bascom RAMAN, NP  END OF SESSION:  PT End of Session - 01/11/25 1446     Visit Number 6    Number of Visits 15   recert   Date for Recertification  03/22/25   recert, to allow for scheduling delays   Authorization Type Medicaid [employer]    Authorization Time Period 10 visits approved 01/09/25-04/08/25    Authorization - Visit Number 1    Authorization - Number of Visits 10    Progress Note Due on Visit 10    PT Start Time 1445    PT Stop Time 1525    PT Time Calculation (min) 40 min    Activity Tolerance Patient tolerated treatment well    Behavior During Therapy WFL for tasks assessed/performed               Past Medical History:  Diagnosis Date   Anemia    Arthralgia    COVID    Iron  deficiency anemia    Muscle pain    Palpitations    Positive ANA (antinuclear antibody)    Prediabetes    Past Surgical History:  Procedure Laterality Date   arm surgery Left    IR RADIOLOGIST EVAL & MGMT  01/04/2025   Patient Active Problem List   Diagnosis Date Noted   Hypokalemia 12/17/2024   Submucous and subserous leiomyoma of uterus 12/17/2024   Adnexal mass 12/17/2024   Tubo-ovarian abscess 12/17/2024   Pelvic pain 12/14/2024   PID (acute pelvic inflammatory disease) 12/14/2024   Low vitamin D  level 04/05/2024   Heavy menstrual bleeding 02/01/2024   Iron  deficiency anemia secondary to blood loss (chronic) 09/17/2023   Positive ANA (antinuclear antibody) 08/06/2023   Muscle pain 07/30/2023    ONSET DATE: 10/13/2024 (referral date)  REFERRING DIAG: R53.81 (ICD-10-CM) - Physical deconditioning M54.50,G89.29 (ICD-10-CM) - Chronic bilateral low back pain without sciatica  THERAPY DIAG:  Physical deconditioning  Cervicalgia  Muscle weakness  (generalized)  Other abnormalities of gait and mobility  Rationale for Evaluation and Treatment: Habilitation  SUBJECTIVE:                                                                                                                                                                                             SUBJECTIVE STATEMENT: Kelli Cooper  Pt reports that her pain is manageable with ibuprofen  and with stretches. She feels like she is making improvement with her stretches, she can  do more and feel more comfortable and confident each day. She feels like the prone shoulder exercises are helping with her pain and the neck stretches help to address her pain before it gets too bad. Pt still has more awareness of her abdominal muscles so has avoided those stretches so far, but starting to feel better.  Pt got her drain out a week ago today.  She is finally able to walk around her block again, she even went 2 blocks a few days ago. She is has not been able to walk [name] yet.  She will be switching to a new ObGyn who is more specialized in what she needs; will decide with her about continuing Lupron  shots.  She also saw her neurosurgeon yesterday; has mild arthritis in her low back but that does not explain why she is having trouble walking; being referred to neurology.  Pt accompanied by: self  PERTINENT HISTORY: PMH: long-Covid, positive ANA, arthralgia, anemia, B12 deficiency, mixed hyperlipidemia, prediabetes   PAIN:  Are you having pain? Yes: NPRS scale: 3/10 Pain location: abdomen Pain description: discomfort Aggravating factors: moving the wrong way Relieving factors: ibuprofen , pain medication  PRECAUTIONS: None  RED FLAGS: None   WEIGHT BEARING RESTRICTIONS: No  FALLS: Has patient fallen in last 6 months? No  LIVING ENVIRONMENT: Lives with: lives alone, with their dog [name] Lives in: House/apartment Stairs: Yes: Internal: 12 steps; on right going up Has following  equipment at home: Single point cane  PLOF: Independent with gait, Independent with transfers, and Requires assistive device for independence  PATIENT GOALS: pain management long term goal of being able to walk in the woods with a cane  OBJECTIVE:  Note: Objective measures were completed at Evaluation unless otherwise noted.  DIAGNOSTIC FINDINGS: None updated or relevant to this POC  COGNITION: Overall cognitive status: Within functional limits for tasks assessed   SENSATION: WFL  COORDINATION: WFL  EDEMA:  None  POSTURE: rounded shoulders and forward head  PALPATION: tightness in cervical paraspinals, thoracic paraspinals, rhomboids, and around shoulder blades with TTP in rhomboids and mid-trap region  CERVICAL ROM:   Active ROM A/PROM (deg) eval  Pain  Flexion 55 -  Extension 55 -  Right lateral flexion 25 Center and to the L - tightness  Left lateral flexion 20 Center and to the R - tightness  Right rotation 50 -  Left rotation 50 -   (Blank rows = not tested)   UPPER EXTREMITY ROM:  Active ROM Right eval Left eval  Shoulder flexion WFL, pain below shoulder blades at end range WFL, pain below shoulder blades at end range  Shoulder extension    Shoulder abduction WFL, pain in pecs at end range [facility], pain in pecs at end range  Shoulder adduction    Shoulder extension    Shoulder internal rotation WFL, tension at end range WFL, tension at end range  Shoulder external rotation [facility]  Elbow flexion    Elbow extension    Wrist flexion    Wrist extension    Wrist ulnar deviation    Wrist radial deviation    Wrist pronation    Wrist supination     (Blank rows = not tested)   UPPER EXTREMITY MMT:  MMT Right eval Left eval  Shoulder flexion 5 5  Shoulder extension    Shoulder abduction 5 5  Shoulder adduction    Shoulder extension    Shoulder internal rotation    Shoulder external rotation  Middle trapezius 3 3  Lower trapezius 3 3  Elbow  flexion    Elbow extension    Wrist flexion    Wrist extension    Wrist ulnar deviation    Wrist radial deviation    Wrist pronation    Wrist supination    Grip strength     (Blank rows = not tested)    CERVICAL SPECIAL TESTS:  None assessed at eval  BED MOBILITY:  Mod I  TRANSFERS: Sit to stand: Modified independence  Assistive device utilized: Single point cane     Stand to sit: Modified independence  Assistive device utilized: Single point cane     Chair to chair: Modified independence  Assistive device utilized: Single point cane        GAIT: Findings: WFL  FUNCTIONAL TESTS:  None assessed at eval  PATIENT SURVEYS:  NDI: 32/50                                                                                                                              TREATMENT DATE:   TherAct SciFit multi-peaks level 3 for 8 minutes using BUE/BLEs for neural priming for reciprocal movement, dynamic cardiovascular warmup and increased amplitude of stepping. RPE of 8/10 following activity.   To work on B shoulder strengthening: With 2# dumbbells: Shoulder flexion 2 x 10 reps X 10 reps no weight Shoulder abduction 2 x 10 reps X 10 reps no weight Shoulder scaption 2 x 10 reps X 10 reps no weight   Seated anterior lean on red Swiss ball on mat table while facing mat table in chair: Anterior lean 3 x 30 sec R/L anteriolateral lean 3 x 30 sec each B    Self-Care Vitals:   01/11/25 1457  BP: 102/72  Pulse: 75   Assessed in LUE while seated at rest. Vitals WNL.      PATIENT EDUCATION: Education details: continue HEP and increasing activity levels as tolerated Person educated: Patient Education method: Explanation, Demonstration, Tactile cues, and Verbal cues Education comprehension: verbalized understanding, returned demonstration, and needs further education  HOME EXERCISE PROGRAM: Access Code: [facility] URL: [email or web address] Date:  11/03/2024 Prepared by: Waddell Southgate  Exercises - Supine Suboccipital Release with Tennis Balls  - 1 x daily - 7 x weekly - 1 sets - 1 reps - 5-10 min hold - Standing Bilateral Shoulder Internal Rotation AAROM with Dowel  - 1 x daily - 7 x weekly - 3 sets - 10 reps - Seated Upper Trapezius Stretch  - 1 x daily - 7 x weekly - 1 sets - 3-5 reps - 30 sec hold - Gentle Levator Scapulae Stretch  - 1 x daily - 7 x weekly - 1 sets - 3-5 reps - 30 sec hold - Seated Cervical Traction  - 1 x daily - 7 x weekly - 1 sets - 10 reps - 10-15 sec hold - Prone Shoulder Flexion  - 1 x daily -  7 x weekly - 3 sets - 10 reps - Prone Single Arm Shoulder Y  - 1 x daily - 7 x weekly - 3 sets - 10 reps - Prone W Scapular Retraction  - 1 x daily - 7 x weekly - 3 sets - 10 reps - Prone Scapular Slide with Shoulder Extension  - 1 x daily - 7 x weekly - 3 sets - 10 reps - Seated High Lat Pull Down with Overhead Anchored Resistance  - 1 x daily - 7 x weekly - 3 sets - 10 reps - Shoulder extension with resistance - Neutral  - 1 x daily - 7 x weekly - 3 sets - 10 reps - Scapular Retraction with Resistance  - 1 x daily - 7 x weekly - 3 sets - 10 reps - Beginner Front Arm Support  - 1 x daily - 7 x weekly - 3 sets - 10 reps   Verbally added sit to stands 12/28/24  GOALS: Goals reviewed with patient? Yes  SHORT TERM GOALS: Target date: 11/24/2024   Pt will be independent with initial HEP for improved cervical ROM, improved posture and management of pain symptoms in order to build upon functional gains made in therapy. Baseline: independent with current HEP (12/12) Goal status: MET   LONG TERM GOALS: Target date: 12/14/2024   Pt will be independent with final HEP for improved cervical ROM, improved posture and management of pain symptoms in order to build upon functional gains made in therapy. Baseline:  Goal status: IN PROGRESS  2.  Pt will increase her bilateral lateral cervical flexion by >/= 10 degrees for  improved function. Baseline:  CERVICAL ROM:   Active ROM AROM (deg) Eval 11/12/2024  Pain AROM (deg) 12/28/24  Pain  Right lateral flexion 25 Center and to the L - tightness 45 No pain  Left lateral flexion 20 Center and to the R - tightness 25 No pain    Goal status: IN PROGRESS  3.  Pt will improve her score on the NDI to </= 27/50 for decreased pain and improved function Baseline: 32/50 (11/19), not reassessed due to time constraints (1/14) Goal status: IN PROGRESS  4.  Pt will increase her mid and lower trap strength to 4/5 for improved function and decreased pain Baseline: 3/5 (11/19), not reassessed as pt unable to get into prone position due to drain (1/4) Goal status: IN PROGRESS   NEW SHORT TERM GOALS:   Target date: 01/25/2025   Pt will improve gait velocity to at least 2.0 for improved gait efficiency and performance at mod I level  Baseline: 1.68 ft/sec with SPC (1/4) Goal status: INITIAL  2.  Pt will ambulate greater than or equal to 225 feet on with LRAD and mod I for improved cardiovascular endurance and BLE strength.  Baseline: 176 ft with SPC, RPE 6/10 (1/14) Goal status: INITIAL    NEW LONG TERM GOALS:  Target date: 02/22/2025   Pt will improve gait velocity to at least 2.25 ft/sec for improved gait efficiency and performance at mod I level  Baseline: 1.68 ft/sec with SPC (1/4) Goal status: INITIAL  2.  Pt will ambulate greater than or equal to 275 feet on with LRAD and mod I for improved cardiovascular endurance and BLE strength.  Baseline: 176 ft with SPC, RPE 6/10 (1/14) Goal status: INITIAL  3.  Pt will improve 5 x STS to less than or equal to 17 seconds to demonstrate improved functional strength and  transfer efficiency with no UE support.  Baseline: 20.06 sec hands on thighs (1/14) Goal status: INITIAL  4.  Pt will improve her score on the NDI to </= 27/50 for decreased pain and improved function Baseline: 32/50 (11/19), not  reassessed due to time constraints (1/14) Goal status: IN PROGRESS     ASSESSMENT:  CLINICAL IMPRESSION: Emphasis of skilled PT session on working on gentle strengthening and stretching as pt still recovers from her most recent hospitalization and returns to her PLOF. She exhibits improved endurance and tolerance for activity this visit and is able to tell when she needs to ease off and take a break. She continues to benefit from skilled PT services to work towards improving her strength and endurance in order to increase her management of her pain symptoms and improve her function and return to her PLOF. Continue POC.    OBJECTIVE IMPAIRMENTS: decreased activity tolerance, decreased knowledge of condition, decreased ROM, decreased strength, increased fascial restrictions, impaired perceived functional ability, impaired UE functional use, improper body mechanics, postural dysfunction, and pain.   ACTIVITY LIMITATIONS: carrying, lifting, and reach over head  PARTICIPATION LIMITATIONS: meal prep, cleaning, laundry, driving, shopping, community activity, occupation, and yard work  PERSONAL FACTORS: Time since onset of injury/illness/exacerbation, Transportation, and 3+ comorbidities:  long-Covid, positive ANA, arthralgia, anemia, B12 deficiency, mixed hyperlipidemia, prediabetesare also affecting patient's functional outcome.   REHAB POTENTIAL: Good  CLINICAL DECISION MAKING: Stable/uncomplicated  EVALUATION COMPLEXITY: Low  PLAN:  PT FREQUENCY: 1x/week  PT DURATION: 8 weeks + 8 weeks (recert)  PLANNED INTERVENTIONS: 02835- PT Re-evaluation, 97750- Physical Performance Testing, 97110-Therapeutic exercises, 97530- Therapeutic activity, [id number]- Neuromuscular re-education, 97535- Self Care, 02859- Manual therapy, [id number]- Aquatic Therapy, [phone]- Electrical stimulation (manual), [phone] (1-2 muscles), [id number] (3+ muscles)- Dry Needling, Patient/Family education, Taping, Joint mobilization, Spinal  mobilization, Cryotherapy, and Moist heat  PLAN FOR NEXT SESSION: how is HEP, endurance, energy levels, check vitals, SciFit, more standing and endurance exercises; circuits? Med ball   Waddell Southgate, PT Waddell Southgate, PT, DPT, CSRS  01/11/2025, 3:29 PM  For all possible CPT codes, reference the Planned Interventions line above.     Check all conditions that are expected to impact treatment: {Conditions expected to impact treatment:Medical complications related to COVID-19 and Presence of Medical Equipment   If treatment provided at initial evaluation, no treatment charged due to lack of authorization.             "

## 2025-01-12 ENCOUNTER — Other Ambulatory Visit: Payer: Self-pay

## 2025-01-12 ENCOUNTER — Telehealth: Payer: Self-pay

## 2025-01-12 NOTE — Transitions of Care (Post Inpatient/ED Visit) (Signed)
" °  Transition of Care week 4  Visit Note  01/12/2025  Name: KALISSA GRAYS MRN: [id number]          DOB: December 14, 1981  Situation: Patient enrolled in [facility] 30-day program. Visit completed with patient  by telephone.   Background:   Initial Transition Care Management Follow-up Telephone Call Discharge Date and Diagnosis: 12/20/24, left tuboovarian abscess   Past Medical History:  Diagnosis Date   Anemia    Arthralgia    COVID    Iron  deficiency anemia    Muscle pain    Palpitations    Positive ANA (antinuclear antibody)    Prediabetes     Assessment: Patient Reported Symptoms: Cognitive Cognitive Status: No symptoms reported, Able to follow simple commands, Alert and oriented to person, place, and time, Insightful and able to interpret abstract concepts      Neurological Neurological Review of Symptoms: No symptoms reported Oher Neurological Symptoms/Conditions [RPT]: Chronic lower legs - ongoing progression Neurological Management Strategies: Medication therapy, Routine screening Neurological Self-Management Outcome: 4 (good)  HEENT HEENT Symptoms Reported: Nasal discharge (sometimes)      Cardiovascular Cardiovascular Symptoms Reported: No symptoms reported Does patient have uncontrolled Hypertension?: No Cardiovascular Management Strategies: Routine screening  Respiratory Respiratory Symptoms Reported: No symptoms reported    Endocrine Endocrine Symptoms Reported: No symptoms reported Is patient diabetic?: No Endocrine Self-Management Outcome: 4 (good) Endocrine Comment: Awaiting Hysterectomy to start Lupron  injection on 01/05/25 checking with PCP as well.  Gastrointestinal Gastrointestinal Symptoms Reported: No symptoms reported Gastrointestinal Management Strategies: Medication therapy Gastrointestinal Self-Management Outcome: 4 (good)    Genitourinary Genitourinary Symptoms Reported: No symptoms reported    Integumentary Integumentary Symptoms Reported:  Wound Other Integumentary Symptoms: site healed up Skin Management Strategies: Medication therapy Skin Self-Management Outcome: 4 (good)  Musculoskeletal Musculoskelatal Symptoms Reviewed: No symptoms reported, Weakness Additional Musculoskeletal Details: OP PT every two weeks, feeling stronger, getting closer to baseline Musculoskeletal Management Strategies: Activity, Adequate rest Musculoskeletal Self-Management Outcome: 4 (good)      Psychosocial Psychosocial Symptoms Reported: No symptoms reported Additional Psychological Details: Need to follow up with PCP and mental health [facility]  - Angeline Novak appointment every other week Behavioral Management Strategies: Counseling, Medication therapy Behavioral Health Self-Management Outcome: 4 (good)       There were no vitals filed for this visit. Pain Scale: 0-10 Pain Score: 3  (during the waking hours) Pain Type: Chronic pain, Surgical pain Pain Location: Rib cage Pain Orientation: Left, Right Pain Descriptors / Indicators: Discomfort  Medications Reviewed Today   Medications were not reviewed in this encounter     Recommendation:   Continue Current Plan of Care  Follow Up Plan:   Telephone follow-up in 1 week  Richerd Fish, RN, BSN, CCM [employer], [facility] RN Care Manager Direct Dial: [phone]           "

## 2025-01-12 NOTE — Patient Instructions (Signed)
 Visit Information  Thank you for taking time to visit with me today. Please don't hesitate to contact me if I can be of assistance to you before our next scheduled telephone appointment.  Our next appointment is by telephone on 01/19/25 at 1400  Following is a copy of your care plan:   Goals Addressed             This Visit's Progress    VBCI Transitions of Care (TOC) Care Plan   On track    Problems: Hospitalized at [facility] DC 12/21/24 - review Recent Hospitalization for treatment of Tubo-ovarian abscess s/p post op with drain  No Hospital Follow Up Provider appointment Was able to assist patient in making a BookIT appointment made with PCP , SDOH barrier: addressed patient states no current needs , and post op care review needs 12/29/24 Has PCP follow up Monday  01/02/25 and reviewed s/s of infections, has transportation to appointments, states all SDOH are currently met 01/06/25 Drain removed, reviewed s/s of infection, check site for redness, swelling, check for fever, uncontrolled pain. Patient states another pocket found, have upcoming appointment for discussion on hysterectomy, reviewed appointments and patient will call to adjust as noted two appointments for 01/23/25. 01/12/25 Post hospital [facility] program follow up progressing well, still awaiting follow up with OB GYN in which she feels she needs to re-schedule due to another appointment that day, she feels it will be too taxing on her. States all needs are currently met, progressing well through inclement weather. Has telephonic follow up with her MH person for ongoing well being.  Goal:  Over the next 30 days, the patient will not experience hospital readmission  Interventions:  Transitions of Care: Doctor Visits  - discussed the importance of doctor visits  Surgery (Tubo-ovarian abscess s/p surgical procedure): Evaluation of current treatment plan related to Tubo-ovarian abscess  surgery assessed  patient/caregiver understanding of surgical procedure   reviewed post-operative instructions with patient/caregiver addressed questions about post - surgical incision care  reviewed medications with patient and addressed questions reviewed scheduled provider appointments with patientand assisted to get PCP appointment set up confirmed availability of transportation to all appointments patient has made arrangements through [facility] transportation arranged performed PHQ2-PHQ9 assessment     Patient Self Care Activities:  Attend all scheduled provider appointments Call pharmacy for medication refills 3-7 days in advance of running out of medications Call provider office for new concerns or questions  Notify RN Care Manager of [facility] call rescheduling needs Participate in Transition of Care Program/Attend TOC scheduled calls Take medications as prescribed   01/06/25 Progressing in activity, saw OP PT and doing exercises, was able to walk a short distance to store without problems.  Plan:  The patient has been provided with contact information for the care management team and has been advised to call with any health related questions or concerns.  12/22/24 Discussed and offered 30 day TOC program.  Patient agrees to weekly calls in program.  The patient has been provided with contact information for the care management team and has been advised to call with any health -related questions or concerns.  The patient verbalized understanding with current plan of care.  The patient is directed to their insurance card regarding availability of benefits coverage for transportation - patient is active in setting up for needs.   12/29/24 Care gaps were reviewed with patient today and patient verbalized understanding to follow up with primary care provider during office  visits and discuss any concerns.  Patient to follow up with PCP on 01/02/25 regarding getting the flu vaccine, thought it was discussed in the hospital  but don't remember getting it. Medication list reviewed.   01/12/25  preparing for upcoming winter storm reviewed and states needs are met. Continue plan of care she states progressing well. Next follow up scheduled in 1 week.         Patient verbalizes understanding of instructions and care plan provided today and agrees to view in MyChart. Active MyChart status and patient understanding of how to access instructions and care plan via MyChart confirmed with patient.     The patient has been provided with contact information for the care management team and has been advised to call with any health related questions or concerns.   Please call the care guide team at [phone] if you need to cancel or reschedule your appointment.   Please call the Suicide and Crisis Lifeline: 988 call the [place]  National Suicide Prevention Lifeline: 507-364-6799 or TTY: 251-653-9069 TTY (581)475-3215) to talk to a trained counselor call 1-800-273-TALK (toll free, 24 hour hotline) go to [facility] [address] [place] [phone]) call 911 if you are experiencing a Mental Health or Behavioral Health Crisis or need someone to talk to.  Richerd Fish, RN, BSN, CCM [facility], [facility] RN Care Manager Direct Dial: [phone]

## 2025-01-16 ENCOUNTER — Ambulatory Visit

## 2025-01-18 ENCOUNTER — Encounter: Payer: Self-pay | Admitting: Physician Assistant

## 2025-01-18 ENCOUNTER — Ambulatory Visit: Admitting: Physician Assistant

## 2025-01-18 ENCOUNTER — Other Ambulatory Visit (HOSPITAL_COMMUNITY): Payer: Self-pay

## 2025-01-18 VITALS — BP 132/86

## 2025-01-18 DIAGNOSIS — L700 Acne vulgaris: Secondary | ICD-10-CM

## 2025-01-18 DIAGNOSIS — L705 Acne excoriee des jeunes filles: Secondary | ICD-10-CM | POA: Diagnosis not present

## 2025-01-18 MED ORDER — TRETINOIN 0.025 % EX CREA
TOPICAL_CREAM | Freq: Every day | CUTANEOUS | 5 refills | Status: AC
  Filled 2025-01-18: qty 45, fill #0

## 2025-01-18 NOTE — Progress Notes (Signed)
" ° °  New Patient Visit   Subjective  Kelli Cooper is a 44 y.o. female NEW PATIENT who presents for the following: She complains of cystic acne of her face and trunk. It has calmed down since she started on Lupron  injections in preparation for a hysterectomy. She started having cystic acne about a year and a half ago. The last time she took an injection, a cyst on her fallopian tube got very inflamed and infected and she had to hospitalized.    The following portions of the chart were reviewed this encounter and updated as appropriate: medications, allergies, medical history  Review of Systems:  No other skin or systemic complaints except as noted in HPI or Assessment and Plan.  Objective  Well appearing patient in no apparent distress; mood and affect are within normal limits.   A focused examination was performed of the following areas: Face   Relevant exam findings are noted in the Assessment and Plan.    Assessment & Plan   ACNE EXCORIEE -- FACE  Exam: Excoriations in several stages of healing   Treatment Plan: Tretinoin  0.025% cream Apply pea size amount to face every other night and increase to nightly as tolerated   ACNE VULGARIS    Return if symptoms worsen or fail to improve.  I, Roseline Hutchinson, CMA, am acting as scribe for Manreet Kiernan K, PA-C .   Documentation: I have reviewed the above documentation for accuracy and completeness, and I agree with the above.  Ellarae Nevitt K, PA-C    "

## 2025-01-18 NOTE — Progress Notes (Deleted)
" ° °  Follow-Up Visit   Subjective  Kelli Cooper is a 44 y.o. female who presents for the following: Acne Vulgaris  ***  The following portions of the chart were reviewed this encounter and updated as appropriate: medications, allergies, medical history  Review of Systems:  No other skin or systemic complaints except as noted in HPI or Assessment and Plan.  Objective  Well appearing patient in no apparent distress; mood and affect are within normal limits.  Areas Examined: Face, chest and back  Relevant exam findings are noted in the Assessment and Plan.   Assessment & Plan    ACNE VULGARIS Exam: Open comedones and inflammatory papules***  ***wellcontrolled vs notatgoal vs flared  Treatment Plan: ***     No follow-ups on file.  ***  Documentation: I have reviewed the above documentation for accuracy and completeness, and I agree with the above.  SANDRIDGE,BRENDA K, PA-C  "

## 2025-01-18 NOTE — Progress Notes (Deleted)
" ° °  New Patient Visit   Subjective  Kelli Cooper is a 44 y.o. female NEW PATIENT who presents for the following: acne located on ***. Pt reports areas *** bothersome. Pt is currently using *** and reports effectiveness. Pt has tried and failed ***. Pt endorses the following concerns: *** Pt *** seen a dermatologist in the past.    The following portions of the chart were reviewed this encounter and updated as appropriate: medications, allergies, medical history  Review of Systems:  No other skin or systemic complaints except as noted in HPI or Assessment and Plan.  Objective  Well appearing patient in no apparent distress; mood and affect are within normal limits.  ***A full examination was performed including scalp, head, eyes, ears, nose, lips, neck, chest, axillae, abdomen, back, buttocks, bilateral upper extremities, bilateral lower extremities, hands, feet, fingers, toes, fingernails, and toenails. All findings within normal limits unless otherwise noted below.  A focused examination was performed of the following areas: ***  Relevant exam findings are noted in the Assessment and Plan.    Assessment & Plan       No follow-ups on file.  LILLETTE Virgle Boards, Dermatology Mohs Tech am acting as a neurosurgeon for SANDRIDGE,BRENDA K, PA-C.  Documentation: I have reviewed the above documentation for accuracy and completeness, and I agree with the above.  SANDRIDGE,BRENDA K, PA-C  . "

## 2025-01-18 NOTE — Patient Instructions (Signed)

## 2025-01-19 ENCOUNTER — Other Ambulatory Visit (HOSPITAL_COMMUNITY): Payer: Self-pay

## 2025-01-19 ENCOUNTER — Other Ambulatory Visit: Payer: Self-pay

## 2025-01-19 ENCOUNTER — Telehealth: Payer: Self-pay

## 2025-01-19 DIAGNOSIS — D500 Iron deficiency anemia secondary to blood loss (chronic): Secondary | ICD-10-CM

## 2025-01-19 DIAGNOSIS — N7093 Salpingitis and oophoritis, unspecified: Secondary | ICD-10-CM

## 2025-01-19 DIAGNOSIS — N730 Acute parametritis and pelvic cellulitis: Secondary | ICD-10-CM

## 2025-01-19 NOTE — Patient Instructions (Signed)
 Visit Information  Thank you for taking time to visit with me today. Please don't hesitate to contact me if I can be of assistance to you before our next scheduled telephone appointment.  Our next appointment is referred to longitudinal RN for complex care management follow up support. Following is a copy of your care plan:   Goals Addressed             This Visit's Progress    VBCI Transitions of Care (TOC) Care Plan   On track    Problems: Hospitalized at [facility] DC 12/21/24 - review Recent Hospitalization for treatment of Tubo-ovarian abscess s/p post op with drain  No Hospital Follow Up Provider appointment Was able to assist patient in making a BookIT appointment made with PCP , SDOH barrier: addressed patient states no current needs , and post op care review needs 12/29/24 Has PCP follow up Monday  01/02/25 and reviewed s/s of infections, has transportation to appointments, states all SDOH are currently met 01/06/25 Drain removed, reviewed s/s of infection, check site for redness, swelling, check for fever, uncontrolled pain. Patient states another pocket found, have upcoming appointment for discussion on hysterectomy, reviewed appointments and patient will call to adjust as noted two appointments for 01/23/25. 01/12/25 Post hospital [facility] program follow up progressing well, still awaiting follow up with OB GYN in which she feels she needs to re-schedule due to another appointment that day, she feels it will be too taxing on her. States all needs are currently met, progressing well through inclement weather. Has telephonic follow up with her MH person for ongoing well being. 01/19/25 Reviewed appointments missed due to inclement weather, patient to rescheduled appointments missed and plans to cancel OBGYN appointment for 01/23/25 and reschedule. Patient prefers scheduling due to ongoing uses Medicaid transportation and to arrange appointments accordingly. States no issues doing  it except her energy is low for tasking today.  Goal:  Over the next 30 days, the patient will not experience hospital readmission  Interventions:  Transitions of Care: Doctor Visits  - discussed the importance of doctor visits  Surgery (Tubo-ovarian abscess s/p surgical procedure): Evaluation of current treatment plan related to Tubo-ovarian abscess  surgery assessed patient/caregiver understanding of surgical procedure   reviewed post-operative instructions with patient/caregiver addressed questions about post - surgical incision care  reviewed medications with patient and addressed questions reviewed scheduled provider appointments with patientand assisted to get PCP appointment set up confirmed availability of transportation to all appointments patient has made arrangements through [facility] transportation arranged performed PHQ2-PHQ9 assessment     Patient Self Care Activities:  Attend all scheduled provider appointments Call pharmacy for medication refills 3-7 days in advance of running out of medications Call provider office for new concerns or questions  Notify RN Care Manager of [facility] call rescheduling needs Participate in Transition of Care Program/Attend TOC scheduled calls Take medications as prescribed   01/06/25 Progressing in activity, saw OP PT and doing exercises, was able to walk a short distance to store without problems. 01/19/25 Reschedule appointments missed  Plan:  The patient has been provided with contact information for the care management team and has been advised to call with any health related questions or concerns.  12/22/24 Discussed and offered 30 day TOC program.  Patient agrees to weekly calls in program.  The patient has been provided with contact information for the care management team and has been advised to call with any health -related questions or concerns.  The patient verbalized understanding with current plan of care.  The patient is directed to  their insurance card regarding availability of benefits coverage for transportation - patient is active in setting up for needs.   12/29/24 Care gaps were reviewed with patient today and patient verbalized understanding to follow up with primary care provider during office visits and discuss any concerns.  Patient to follow up with PCP on 01/02/25 regarding getting the flu vaccine, thought it was discussed in the hospital but don't remember getting it. Medication list reviewed.   01/12/25  preparing for upcoming winter storm reviewed and states needs are met. Continue plan of care she states progressing well. Next follow up scheduled in 1 week. 01/19/25 TOC program completed and will refer to longitudinal [facility] RN as patient desires ongoing support and follow up.         Patient verbalizes understanding of instructions and care plan provided today and agrees to view in MyChart. Active MyChart status and patient understanding of how to access instructions and care plan via MyChart confirmed with patient.     The patient has been provided with contact information for the care management team and has been advised to call with any health related questions or concerns.   Please call the care guide team at [phone] if you need to cancel or reschedule your appointment.   Please call the Suicide and Crisis Lifeline: 988 call the [place]  National Suicide Prevention Lifeline: (320) 829-7929 or TTY: (564)149-2406 TTY 249-481-9746) to talk to a trained counselor call 1-800-273-TALK (toll free, 24 hour hotline) go to [facility] [address], [place] [phone]) call 911 if you are experiencing a Mental Health or Behavioral Health Crisis or need someone to talk to.  Richerd Fish, RN, BSN, CCM [facility], [facility] RN Care Manager Direct Dial: [phone]

## 2025-01-19 NOTE — Transitions of Care (Post Inpatient/ED Visit) (Signed)
 " Transition of Care week 4/5 Final  Visit Note  01/19/2025  Name: Kelli Cooper MRN: [id number]          DOB: [birth year]  Situation: Patient enrolled in [facility] 30-day program. Visit completed with patient by telephone.   Background:   Initial Transition Care Management Follow-up Telephone Call Discharge Date and Diagnosis: 12/20/24, left tuboovarian abscess   Past Medical History:  Diagnosis Date   Anemia    Arthralgia    COVID    Iron  deficiency anemia    Muscle pain    Palpitations    Positive ANA (antinuclear antibody)    Prediabetes     Assessment: Patient Reported Symptoms: Cognitive Cognitive Status: No symptoms reported, Normal speech and language skills, Insightful and able to interpret abstract concepts      Neurological Neurological Review of Symptoms: No symptoms reported Oher Neurological Symptoms/Conditions [RPT]: lower leg weakness Neurological Management Strategies: Medication therapy, Routine screening Neurological Self-Management Outcome: 4 (good)  HEENT HEENT Symptoms Reported: No symptoms reported      Cardiovascular Cardiovascular Symptoms Reported: No symptoms reported Does patient have uncontrolled Hypertension?: No Cardiovascular Management Strategies: Routine screening  Respiratory Respiratory Symptoms Reported: No symptoms reported    Endocrine Endocrine Symptoms Reported: No symptoms reported Is patient diabetic?: No Endocrine Self-Management Outcome: 4 (good)  Gastrointestinal Gastrointestinal Symptoms Reported: No symptoms reported      Genitourinary Genitourinary Symptoms Reported: No symptoms reported Genitourinary Self-Management Outcome: 4 (good)  Integumentary Integumentary Symptoms Reported: No symptoms reported Other Integumentary Symptoms: head Skin Management Strategies: Medication therapy Skin Self-Management Outcome: 4 (good)  Musculoskeletal Musculoskelatal Symptoms Reviewed: No symptoms reported Musculoskeletal  Management Strategies: Activity, Adequate rest      Psychosocial Psychosocial Symptoms Reported: No symptoms reported Additional Psychological Details: update:  [facility] Behavioral Management Strategies: Counseling, Medication therapy Behavioral Health Self-Management Outcome: 4 (good)       There were no vitals filed for this visit. Pain Scale: 0-10 Pain Score: 2  Pain Type: Chronic pain, Surgical pain  Medications Reviewed Today     Reviewed by Eilleen Richerd GRADE, RN (Registered Nurse) on 01/19/25 at 1428  Med List Status: <None>   Medication Order Taking? Sig Documenting Provider Last Dose Status Informant  Blood Pressure Monitoring (OMRON 3 SERIES BP MONITOR) DEVI [id number]  Use daily as directed Nichols, Tonya S, NP  Active            Med Note LESLY, RICHERD GRADE Schaumann Dec 29, 2024  1:56 PM) Only check as needed  cyanocobalamin  (VITAMIN B12) 1000 MCG tablet [id number] Yes Take 1 tablet (1,000 mcg total) by mouth daily. Oley Bascom RAMAN, NP  Active   cyclobenzaprine  (FLEXERIL ) 10 MG tablet [id number] Yes Take 1 tablet (10 mg total) by mouth 3 (three) times daily as needed for muscle spasms. Oley Bascom RAMAN, NP  Active   hydrOXYzine  (ATARAX ) 10 MG tablet [id number]  Take 1 tablet (10 mg total) by mouth 3 (three) times daily as needed for anxiety or itching.  Patient not taking: Reported on 01/19/2025   Oley Bascom RAMAN, NP  Active   ibuprofen  (ADVIL ) 200 MG tablet [id number] Yes Take 200 mg by mouth every 6 (six) hours as needed. [provider]  Active            Med Note SAMULE, [facility]   Wed Jul 29, 2023  3:20 PM) prn  Iron , Ferrous Sulfate , 325 (65 Fe) MG TABS [id number] Yes Take 325 mg by  mouth daily. Oley Bascom RAMAN, NP  Active   LUPRON  DEPOT, 65-MONTH, 3.75 MG injection [id number]  Inject into the muscle. [provider]  Active            Med Note LESLY, RICHERD CINDERELLA Schaumann Jan 19, 2025  2:07 PM) Still on hold - OB-GYN and PCP to  assist with determining when  norethindrone  (MICRONOR ) 0.35 MG tablet [id number] Yes Take 1 tablet (0.35 mg total) by mouth daily. Tad Arland POUR, CNM  Active   ondansetron  (ZOFRAN ) 4 MG tablet [id number] Yes Take 1 tablet (4 mg total) by mouth every 6 (six) hours as needed for nausea. Cleotilde Ronal RAMAN, MD  Active   Oxycodone  HCl 10 MG TABS [id number]  Take 1 tablet (10 mg total) by mouth every 4 (four) hours as needed. Cleotilde Ronal RAMAN, MD  Active            Med Note [place], RICHERD CINDERELLA Schaumann Dec 29, 2024  2:12 PM) Patient wants to discuss with PCP of lowering the dosage or a different pain regimen on 12/30/24  sodium chloride  flush 0.9 % SOLN injection [id number]  5 mLs by Intracatheter route daily. Huneycutt, Brittany, NP  Active   tretinoin  (RETIN-A ) 0.025 % cream [id number]  Apply topically at bedtime. Apply pea size amount to face every other night and increase to nightly as tolerated.  Patient not taking: Reported on 01/19/2025   Sandridge, Brenda K, PA-C  Active             Recommendation:   Referral to: Longitudinal team  Follow Up Plan:   Referral to RN Case Manager Closing From:  Transitions of Care Program Patient has met all care management goals. Care Management case will be closed. Patient has been provided contact information should new needs arise.   Richerd Fish, RN, BSN, CCM [facility], [facility] RN Care Manager Direct Dial: [phone]           "

## 2025-01-20 ENCOUNTER — Telehealth: Payer: Self-pay | Admitting: *Deleted

## 2025-01-20 NOTE — Progress Notes (Signed)
 Complex Care Management Note  Care Guide Note 01/20/2025 Name: Kelli Cooper MRN: [id number] DOB: 03-14-1981  [name] is a 44 y.o. year old female who sees Oley Bascom RAMAN, NP for primary care. I reached out to [employer] by phone today to offer complex care management services.  Ms. Alguire was given information about Complex Care Management services today including:   The Complex Care Management services include support from the care team which includes your Nurse Care Manager, Clinical Social Worker, or Pharmacist.  The Complex Care Management team is here to help remove barriers to the health concerns and goals most important to you. Complex Care Management services are voluntary, and the patient may decline or stop services at any time by request to their care team member.   Complex Care Management Consent Status: Patient agreed to services and verbal consent obtained.   Follow up plan:  Telephone appointment with complex care management team member scheduled for:  02/01/25  Encounter Outcome:  Patient Scheduled  Harlene Satterfield  [facility]  [facility], [facility] Guide  Direct Dial: [phone]  Fax [phone]

## 2025-01-23 ENCOUNTER — Ambulatory Visit

## 2025-01-23 ENCOUNTER — Ambulatory Visit: Admitting: Obstetrics and Gynecology

## 2025-01-23 ENCOUNTER — Ambulatory Visit: Payer: Self-pay | Admitting: Obstetrics and Gynecology

## 2025-01-24 ENCOUNTER — Ambulatory Visit

## 2025-01-27 ENCOUNTER — Ambulatory Visit: Payer: Self-pay | Admitting: Physical Therapy

## 2025-01-31 ENCOUNTER — Ambulatory Visit

## 2025-02-01 ENCOUNTER — Telehealth

## 2025-02-07 ENCOUNTER — Ambulatory Visit: Payer: Self-pay | Admitting: Physical Therapy

## 2025-02-17 ENCOUNTER — Ambulatory Visit: Payer: Self-pay | Admitting: Nurse Practitioner

## 2025-02-21 ENCOUNTER — Ambulatory Visit: Payer: Self-pay | Admitting: Physical Therapy

## 2025-02-21 ENCOUNTER — Ambulatory Visit: Admitting: Obstetrics and Gynecology

## 2025-02-23 ENCOUNTER — Ambulatory Visit: Admitting: Rheumatology

## 2025-03-07 ENCOUNTER — Ambulatory Visit: Payer: Self-pay | Admitting: Physical Therapy

## 2025-03-27 ENCOUNTER — Inpatient Hospital Stay
